# Patient Record
Sex: Male | Born: 1955 | Race: White | Hispanic: No | Marital: Married | Smoking: Never smoker
Health system: Southern US, Community
[De-identification: ages and names within clinical notes are randomized; demographics above are authoritative.]

## PROBLEM LIST (undated history)

## (undated) DIAGNOSIS — D580 Hereditary spherocytosis: Secondary | ICD-10-CM

## (undated) DIAGNOSIS — F329 Major depressive disorder, single episode, unspecified: Secondary | ICD-10-CM

## (undated) DIAGNOSIS — R7309 Other abnormal glucose: Secondary | ICD-10-CM

## (undated) DIAGNOSIS — F988 Other specified behavioral and emotional disorders with onset usually occurring in childhood and adolescence: Secondary | ICD-10-CM

## (undated) DIAGNOSIS — I10 Essential (primary) hypertension: Secondary | ICD-10-CM

## (undated) DIAGNOSIS — E785 Hyperlipidemia, unspecified: Secondary | ICD-10-CM

## (undated) DIAGNOSIS — F32A Depression, unspecified: Secondary | ICD-10-CM

## (undated) DIAGNOSIS — D649 Anemia, unspecified: Secondary | ICD-10-CM

## (undated) DIAGNOSIS — E559 Vitamin D deficiency, unspecified: Secondary | ICD-10-CM

## (undated) HISTORY — DX: Vitamin D deficiency, unspecified: E55.9

## (undated) HISTORY — DX: Essential (primary) hypertension: I10

## (undated) HISTORY — PX: FRACTURE SURGERY: SHX138

## (undated) HISTORY — DX: Anemia, unspecified: D64.9

## (undated) HISTORY — DX: Other abnormal glucose: R73.09

## (undated) HISTORY — DX: Depression, unspecified: F32.A

## (undated) HISTORY — DX: Hyperlipidemia, unspecified: E78.5

## (undated) HISTORY — DX: Other specified behavioral and emotional disorders with onset usually occurring in childhood and adolescence: F98.8

## (undated) HISTORY — DX: Hereditary spherocytosis: D58.0

## (undated) HISTORY — DX: Major depressive disorder, single episode, unspecified: F32.9

## (undated) HISTORY — PX: ORTHOPEDIC SURGERY: SHX850

---

## 1999-10-25 ENCOUNTER — Encounter (INDEPENDENT_AMBULATORY_CARE_PROVIDER_SITE_OTHER): Payer: Self-pay

## 1999-10-25 ENCOUNTER — Observation Stay (HOSPITAL_COMMUNITY): Admission: EM | Admit: 1999-10-25 | Discharge: 1999-10-26 | Payer: Self-pay | Admitting: *Deleted

## 2000-03-06 LAB — HM COLONOSCOPY: HM Colonoscopy: NEGATIVE

## 2000-05-17 ENCOUNTER — Ambulatory Visit (HOSPITAL_COMMUNITY): Admission: RE | Admit: 2000-05-17 | Discharge: 2000-05-17 | Payer: Self-pay | Admitting: Gastroenterology

## 2003-04-13 ENCOUNTER — Ambulatory Visit (HOSPITAL_COMMUNITY): Admission: RE | Admit: 2003-04-13 | Discharge: 2003-04-13 | Payer: Self-pay | Admitting: Internal Medicine

## 2009-08-19 ENCOUNTER — Ambulatory Visit (HOSPITAL_COMMUNITY): Admission: RE | Admit: 2009-08-19 | Discharge: 2009-08-19 | Payer: Self-pay | Admitting: Gastroenterology

## 2010-07-22 NOTE — Procedures (Signed)
[facility]  Patient:    Kevin Gomez, Kevin Gomez                     MRN: [id number] Proc. Date: 10/25/99 Adm. Date:  [id number] Disc. Date: [id number] Attending:  Dennison Bulla Ii CC:         Verlin Grills, M.D.   Procedure Report  PROCEDURE:  Flexible sigmoidoscopy and biopsy.  SURGEON:  James L. Edwards, M.D.  MEDICATIONS:  Fentanyl 87.5 mcg and Versed 7 mg IV.  INDICATIONS:  Bright red blood, mild abdominal discomfort, and diarrhea.  DESCRIPTION OF PROCEDURE:  The procedure had been explained to the patient and consent obtained.  Apparently, the original dictation was lost.  The scope was inserted.  We were able to advance to 70 cm.  The mucosa was normal from 40-70 cm.  It was red, friable, edematous, and somewhat ulcerated consistent with a colitis from 20-40 cm.  Stool was obtained and sent for culture.  The patient had received no prep.  He tolerated the procedure well.  ASSESSMENT:  Segmental colitis, possibly infectious versus ischemic versus Crohns.  PLAN:  Will keep n.p.o., check pathology and cultures, and make further recommendations depending on the results. DD:  01/17/00 TD:  01/17/00 Job: [id number]

## 2010-07-22 NOTE — H&P (Signed)
[facility]  Patient:    Kevin Gomez, Kevin Gomez                     MRN: [id number] Adm. Date:  [id number] Attending:  Ephriam Knuckles H CC:         Verlin Grills, M.D.             Dr. Baldwin [place]                         History and Physical  REASON FOR ADMISSION:  Acute GI bleeding.  HISTORY OF PRESENT ILLNESS:  A 55 year old gentleman who has had two days of severe cramping abdominal pain and diarrhea that became bloody, that started yesterday.  He was able to sleep through the night, woke up with bloody diarrhea this morning.  Was seen at the [facility] health center.  Hemoglobin was 11.3, white blood count was normal.  Three hours later, his hemoglobin was still 11.9 and white count was normal.  Due to his bleeding, we had seen him in the emergency room with this history.  MEDICATIONS:  Atenolol, Vasotec, Prozac, Xanax.  ALLERGIES:  He has no drug allergies.  PAST MEDICAL HISTORY:  History of pancreatitis that was felt to be due to high triglycerides.  He also has a history of elevated blood pressure.  He has had a previous endoscopy and colonoscopy by Dr. Laural Benes back in 1999 that was negative.  FAMILY HISTORY:  Positive for gallstones, hypertension, negative for colon cancer.  SOCIAL HISTORY:  The patient is married.  He does not smoke but drinks in the past but has stopped for his pancreatitis.  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  PHYSICAL EXAMINATION:  VITAL SIGNS:  Temperature 97.1, pulse 51, respirations 20, blood pressure 137/67.  GENERAL:  A pleasant, nonicteric white male in no acute distress.  HEENT:  Eyes clear nonicteric.  Extraocular movements intact.  HEART:  Regular rate and rhythm without murmurs or gallops.  LUNGS:  Clear.  ABDOMEN:  Soft with markedly increased bowel sounds and mild tenderness on the left side.  RECTAL:  There is bright red blood on the examining finger by  the ER doctor.  ASSESSMENT:  Acute bloody diarrhea.  This is probably infectious.  Other potential causes would be ischemic or ulcerative colitis, but lack of chronic symptoms makes that less likely.  PLAN:  Will go ahead and do a sigmoidoscopy now and will admit the patient for observation. DD:  10/25/99 TD:  10/26/99 Job: [id number]

## 2010-07-22 NOTE — Procedures (Signed)
[facility]  Patient:    Kevin Gomez, Kevin Gomez                    MRN: [id number] Proc. Date: 05/17/00 Attending:  Verlin Grills, M.D. CC:         Marinus Maw, M.D.   Procedure Report  DATE OF BIRTH:  [birth year]  REFERRING PHYSICIAN:  Marinus Maw, M.D.  PROCEDURE PERFORMED:  Colonoscopy.  ENDOSCOPIST:  Verlin Grills, M.D.  INDICATIONS FOR PROCEDURE:  The patient is a 55 year old male who developed hemoccult positive stool while taking diclofenac for a painful shoulder.  In March 1999, Kevin Gomez underwent esophagogastroduodenoscopy and proctocolonoscopy to the cecum to evaluate iron deficiency anemia.  A hyperplastic but non-neoplastic polyp was removed from his colon.  Otherwise, his esophagogastroduodenoscopy and proctocolonoscopy to the cecum was normal.  I discussed with the patient the complications associated with colonoscopy and polypectomy including intestinal bleeding and intestinal perforation.  The patient has signed the operative permit.  PREMEDICATION:  Fentanyl 100 mcg, Versed 20 mg.  ENDOSCOPE:  Olympus pediatric colonoscope.  DESCRIPTION OF PROCEDURE:  After obtaining informed consent, the patient was placed in the left lateral decubitus position.  I administered intravenous fentanyl and intravenous Versed to achieve sedation for the procedure.  The patients blood pressure, oxygen saturation and cardiac rhythm were monitored throughout the procedure and documented in the medical record.  Anal inspection was normal.  Digital rectal exam was normal.  The Olympus pediatric video colonoscope was then introduced into the rectum and under direct vision, advanced to the cecum as identified by a normal-appearing ileocecal valve.  Colonic preparation for the exam today was excellent.  Rectum:  Normal.  Sigmoid colon:  Normal.  Descending colon:  Normal.  Splenic flexure:   Normal.  Transverse colon:  Normal.  Hepatic flexure:  Normal.  Ascending colon:  Normal.  Cecum and ileocecal valve:  Normal.  ASSESSMENT:  Normal proctocolonoscopy to the cecum.  No endoscopic evidence for the presence of colorectal neoplasia or inflammatory bowel disease. DD:  05/17/00 TD:  05/17/00 Job: [id number]

## 2011-05-31 ENCOUNTER — Ambulatory Visit (HOSPITAL_COMMUNITY)
Admission: RE | Admit: 2011-05-31 | Discharge: 2011-05-31 | Disposition: A | Discharge status: Home / Self Care | Payer: BC Managed Care – PPO | Source: Ambulatory Visit | Attending: Internal Medicine | Admitting: Internal Medicine

## 2011-05-31 ENCOUNTER — Other Ambulatory Visit (HOSPITAL_COMMUNITY): Payer: Self-pay | Admitting: Internal Medicine

## 2011-05-31 DIAGNOSIS — S60219A Contusion of unspecified wrist, initial encounter: Secondary | ICD-10-CM

## 2013-01-22 ENCOUNTER — Encounter: Payer: Self-pay | Admitting: Internal Medicine

## 2013-01-22 DIAGNOSIS — R7309 Other abnormal glucose: Secondary | ICD-10-CM | POA: Insufficient documentation

## 2013-01-22 DIAGNOSIS — D580 Hereditary spherocytosis: Secondary | ICD-10-CM | POA: Insufficient documentation

## 2013-01-22 DIAGNOSIS — F988 Other specified behavioral and emotional disorders with onset usually occurring in childhood and adolescence: Secondary | ICD-10-CM | POA: Insufficient documentation

## 2013-01-22 DIAGNOSIS — I10 Essential (primary) hypertension: Secondary | ICD-10-CM | POA: Insufficient documentation

## 2013-01-23 ENCOUNTER — Encounter: Payer: Self-pay | Admitting: Physician Assistant

## 2013-01-23 ENCOUNTER — Ambulatory Visit: Payer: Self-pay | Admitting: Internal Medicine

## 2013-01-23 ENCOUNTER — Ambulatory Visit: Payer: BC Managed Care – PPO | Admitting: Physician Assistant

## 2013-01-23 ENCOUNTER — Other Ambulatory Visit: Payer: Self-pay | Admitting: Physician Assistant

## 2013-01-23 VITALS — BP 128/78 | HR 56 | Temp 98.9°F | Resp 16 | Ht 70.0 in | Wt 185.0 lb

## 2013-01-23 DIAGNOSIS — E782 Mixed hyperlipidemia: Secondary | ICD-10-CM | POA: Insufficient documentation

## 2013-01-23 DIAGNOSIS — E291 Testicular hypofunction: Secondary | ICD-10-CM

## 2013-01-23 DIAGNOSIS — R7309 Other abnormal glucose: Secondary | ICD-10-CM

## 2013-01-23 DIAGNOSIS — I10 Essential (primary) hypertension: Secondary | ICD-10-CM

## 2013-01-23 DIAGNOSIS — E559 Vitamin D deficiency, unspecified: Secondary | ICD-10-CM

## 2013-01-23 DIAGNOSIS — E785 Hyperlipidemia, unspecified: Secondary | ICD-10-CM

## 2013-01-23 HISTORY — DX: Mixed hyperlipidemia: E78.2

## 2013-01-23 LAB — BASIC METABOLIC PANEL WITH GFR
BUN: 11 mg/dL (ref 6–23)
Chloride: 101 mEq/L (ref 96–112)
GFR, Est African American: >89 mL/min
Glucose, Bld: 86 mg/dL (ref 70–99)
Potassium: 4.6 mEq/L (ref 3.5–5.3)

## 2013-01-23 LAB — CBC WITH DIFFERENTIAL/PLATELET
Basophils Relative: 0 % (ref 0–1)
HCT: 43.2 % (ref 39.0–52.0)
Hemoglobin: 14.5 g/dL (ref 13.0–17.0)
MCH: 20.8 pg — ABNORMAL LOW (ref 26.0–34.0)
MCHC: 33.6 g/dL (ref 30.0–36.0)
Monocytes Absolute: 0.3 10*3/uL (ref 0.1–1.0)
Monocytes Relative: 5 % (ref 3–12)
Neutro Abs: 4.5 10*3/uL (ref 1.7–7.7)

## 2013-01-23 LAB — HEPATIC FUNCTION PANEL
ALT: 53 U/L (ref 0–53)
AST: 52 U/L — ABNORMAL HIGH (ref 0–37)
Alkaline Phosphatase: 61 U/L (ref 39–117)
Indirect Bilirubin: 1.1 mg/dL — ABNORMAL HIGH (ref 0.0–0.9)
Total Protein: 6.1 g/dL (ref 6.0–8.3)

## 2013-01-23 LAB — LIPID PANEL: LDL Cholesterol: 39 mg/dL (ref 0–99)

## 2013-01-23 MED ORDER — TESTOSTERONE CYPIONATE 200 MG/ML IM SOLN: INTRAMUSCULAR | Status: DC

## 2013-01-23 NOTE — Progress Notes (Signed)
HPI Patient presents for 3 month follow up with hypertension, hyperlipidemia, prediabetes and vitamin D. Patient's blood pressure has been controlled at home. Patient denies chest pain, shortness of breath, dizziness.  Patient's cholesterol is diet controlled. The cholesterol last visit was LDL 49, HDL 24, trigs 279. The patient has been working on diet and exercise for prediabetes, and denies changes in vision, polys, and paresthesias.  Last A1C was 5.1.  Patient has hypogonadism and feels that the shots are helping, last shot was one week ago.  Patient is on Vitamin D supplement.  Current Medications:  Current Outpatient Prescriptions on File Prior to Visit  Medication Sig Dispense Refill  . allopurinol (ZYLOPRIM) 300 MG tablet Take 300 mg by mouth daily. Takes 1/2 tag daily      . atenolol (TENORMIN) 100 MG tablet Take 100 mg by mouth daily.      . Cholecalciferol (VITAMIN D) 2000 UNITS tablet Take 2,000 Units by mouth 2 (two) times daily.      Marland Kitchen desvenlafaxine (PRISTIQ) 100 MG 24 hr tablet Take 100 mg by mouth daily.      . enalapril (VASOTEC) 20 MG tablet Take 20 mg by mouth daily.      Marland Kitchen gabapentin (NEURONTIN) 300 MG capsule Take 300 mg by mouth 3 (three) times daily.      . meloxicam (MOBIC) 15 MG tablet Take 15 mg by mouth daily.      . methylphenidate (RITALIN SR) 20 MG ER tablet Take 20 mg by mouth daily. Takes 3 per day      . mirtazapine (REMERON) 45 MG tablet Take 45 mg by mouth at bedtime.      . Omega-3 Fatty Acids (FISH OIL) 1000 MG CAPS Take 1,000 capsules by mouth.      . testosterone cypionate (DEPO-TESTOSTERONE) 200 MG/ML injection Inject into the muscle. Injects 1 ml every week       No current facility-administered medications on file prior to visit.   Medical History:  Past Medical History  Diagnosis Date  . Hypertension   . Depression   . Hyperlipidemia   . Elevated hemoglobin A1c   . Anemia   . ADD (attention deficit disorder)   . Elevated hemoglobin A1c   .  Vitamin D deficiency    Allergies:  Allergies  Allergen Reactions  . Serzone [Nefazodone]     Leg cramps  . Trazamine [Trazodone & Diet Manage Prod] Other (See Comments)    confusion    ROS Constitutional: Denies fever, chills, weight loss/gain, headaches, insomnia, fatigue, night sweats, and change in appetite. Eyes: Denies redness, blurred vision, diplopia, discharge, itchy, watery eyes.  ENT: Denies discharge, congestion, post nasal drip, sore throat, earache, dental pain, Tinnitus, Vertigo, Sinus pain, snoring.  Cardio: Denies chest pain, palpitations, irregular heartbeat,  dyspnea, diaphoresis, orthopnea, PND, claudication, edema Respiratory: denies cough, dyspnea,pleurisy, hoarseness, wheezing.  Gastrointestinal: Denies dysphagia, heartburn,  water brash, pain, cramps, nausea, vomiting, bloating, diarrhea, constipation, hematemesis, melena, hematochezia,  hemorrhoids Genitourinary: Denies dysuria, frequency, urgency, nocturia, hesitancy, discharge, hematuria, flank pain Musculoskeletal: Denies arthralgia, myalgia, stiffness, Jt. Swelling, pain, limp, and strain/sprain. Skin: Denies pruritis, rash, hives, warts, acne, eczema, changing in skin lesion Neuro: Weakness, tremor, incoordination, spasms, paresthesia, pain Psychiatric: Denies confusion, memory loss, sensory loss Endocrine: Denies change in weight, skin, hair change, nocturia, and paresthesia, Diabetic Polys, visual blurring, hyper /hypo glycemic episodes.  Heme/Lymph: Excessive bleeding, bruising, enlarged lymph nodes  Family history- Review and unchanged Social history- Review and unchanged Physical Exam: [facility]  Vitals:   01/23/13 1150  BP: 128/78  Pulse: 56  Temp: 98.9 F (37.2 C)  Resp: 16   Filed Weights   01/23/13 1150  Weight: 185 lb (83.915 kg)   General Appearance: Well nourished, in no apparent distress. Eyes: PERRLA, EOMs, conjunctiva no swelling or erythema, normal fundi and vessels. Sinuses: No  Frontal/maxillary tenderness ENT/Mouth: Ext aud canals clear, with TMs without erythema, bulging.No erythema, swelling, or exudate on post pharynx.  Tonsils not swollen or erythematous. Hearing normal.  Neck: Supple, thyroid normal.  Respiratory: Respiratory effort normal, BS equal bilaterally without rales, rhonchi, wheezing or stridor.  Cardio: Heart sounds normal, regular rate and rhythm without murmurs, rubs or gallops. Peripheral pulses brisk and equal bilaterally, without edema.  Abdomen: Flat, soft, with bowel sounds. Non tender, no guarding, rebound, hernias, masses, or organomegaly.  Lymphatics: Non tender without lymphadenopathy.  Musculoskeletal: Full ROM all peripheral extremities, joint stability, 5/5 strength, and normal gait. Skin: Warm, dry without rashes, lesions, ecchymosis.  Neuro: Cranial nerves intact, reflexes equal bilaterally. Normal muscle tone, no cerebellar symptoms. Sensation intact.  Psych: Awake and oriented X 3, normal affect, Insight and Judgment appropriate.   Assessment and Plan:  Hypertension: Continue medication, monitor blood pressure at home. Continue DASH diet. Cholesterol: Continue diet and exercise. Check cholesterol.  Pre-diabetes-Continue diet and exercise. Since it was normal last time we will not check it this time.  Depression- patient states seeing new psych and seeing Ocie Bob with [facility], continue to follow Hypogonadism- prescription sent, check level and continue shots. Vitamin D Def- check level and continue medications.   Kevin Gomez 12:02 PM

## 2013-01-23 NOTE — Patient Instructions (Signed)
Triglycerides, TG, TRIG This is a test to check your risk of developing heart disease. It is often done as part of a lipid profile during a regular medical exam or if you are being treated for high triglycerides. This test measures the amount of triglycerides in your blood. Triglycerides are the body's storage form for fat. Most triglycerides are found in fat tissue. Some triglycerides circulate in the blood to provide fuel for muscles to work. Extra triglycerides are found in the blood after eating a meal when fat is being sent from the gut to fat tissue for storage. The test for triglycerides should be done when you are fasting and no extra triglycerides from a recent meal are present.  SAMPLE COLLECTION The test for triglycerides uses a blood sample. Most often, the blood sample is collected using a needle to collect blood from a vein. Sometimes triglycerides are measured using a drop of blood collected by puncturing the skin on a finger. Testing should be done when you are fasting. For 12 to 14 hours before the test, only water is permitted. In addition, alcohol should not be consumed for the 24 hours just before the test. If you are diabetic and your blood sugar is out of control, triglycerides will be very high. NORMAL FINDINGS  Adult/elderly  Male: 40-160 mg/dL or 1.61-0.96 mmol/L (SI units)  Male: 35-135 mg/dL or 0.45-4.09 mmol/L (SI units)  0-56 years  Male: 30-86 mg/dL  Male: 81-19 mg/dL  1-56 years  Male: 82-956 mg/dL  Male: 21-308 mg/dL  65-56 years  Male: 46-962 mg/dL  Male: 95-284 mg/dL  13-56 years  Male: 40-102 mg/dL  Male: 72-536 mg/dL Ranges for normal findings may vary among different laboratories and hospitals. You should always check with your doctor after having lab work or other tests done to discuss the meaning of your test results and whether your values are considered within normal limits. MEANING OF TEST  Your caregiver will go over the test  results with you and discuss the importance and meaning of your results, as well as treatment options and the need for additional tests if necessary. OBTAINING THE TEST RESULTS It is your responsibility to obtain your test results. Ask the lab or department performing the test when and how you will get your results. Document Released: 03/25/2004 Document Revised: 05/15/2011 Document Reviewed: 02/02/2008 [facility] Patient Information 2014 [place], [place]. HDL Cholesterol The test for high-density lipoprotein (HDL) cholesterol is used along with other lipid tests to determine your risk of developing heart disease. HDL is one of the classes of lipoproteins that carry cholesterol in the blood. H-density lipoprotein cholesterol is considered to be beneficial because it removes excess cholesterol and disposes of it. Hence HDL cholesterol is often termed "good" cholesterol. The test for HDL measures the amount of HDL cholesterol in blood. High levels of HDL cholesterol are good. PREPARATION FOR TEST A blood sample is drawn from a vein in the arm or from a finger. Fasting is not required unless the HDL cholesterlol level test is ordered with other tests, including total cholesterol, low-density lipoprotein cholesterol, and triglyceride level tests as part of a lipid profile. A complete lipid profile requires fasting for at least 12 hours before the test. If the testing occurs when a person is not fasting, only the HDL and total cholesterol levels may be used for risk assessment. NORMAL FINDINGS Male: greater than 45 mg/dL or greater than 6.44 mmol/L  Male: greater than 55 mg/dL or greater than 0.34 mmol/L  Ranges for normal findings may vary among different laboratories and hospitals. You should always check with your doctor after having lab work or other tests done to discuss the meaning of your test results and whether your values are considered within normal limits. MEANING OF TEST  Your caregiver will go  over the test results with you and discuss the importance and meaning of your results, as well as treatment options and the need for additional tests. OBTAINING THE TEST RESULTS It is your responsibility to obtain your test results. Ask the lab or department performing the test when and how you will get your results. Document Released: 03/16/2004 Document Revised: 10/23/2012 Document Reviewed: 02/01/2008 [facility] Patient Information 2014 [place], [place].

## 2013-01-24 LAB — TSH: TSH: 1.527 u[IU]/mL (ref 0.350–4.500)

## 2013-01-24 LAB — IRON AND TIBC: Iron: 151 ug/dL (ref 42–165)

## 2013-01-24 LAB — VITAMIN D 25 HYDROXY (VIT D DEFICIENCY, FRACTURES): Vit D, 25-Hydroxy: 76 ng/mL (ref 30–89)

## 2013-01-24 LAB — TESTOSTERONE: Testosterone: 138 ng/dL — ABNORMAL LOW (ref 300–890)

## 2013-01-28 ENCOUNTER — Other Ambulatory Visit: Payer: Self-pay | Admitting: Internal Medicine

## 2013-02-05 ENCOUNTER — Other Ambulatory Visit: Payer: Self-pay | Admitting: Internal Medicine

## 2013-03-06 ENCOUNTER — Other Ambulatory Visit: Payer: Self-pay | Admitting: Internal Medicine

## 2013-03-11 NOTE — Telephone Encounter (Signed)
Called pt scheduled pt for F/U on 04/30/13 w/ Marchelle FolksAmanda SB

## 2013-04-28 ENCOUNTER — Other Ambulatory Visit: Payer: Self-pay | Admitting: Internal Medicine

## 2013-04-30 ENCOUNTER — Ambulatory Visit: Payer: Self-pay | Admitting: Physician Assistant

## 2013-05-09 ENCOUNTER — Encounter: Payer: Self-pay | Admitting: Physician Assistant

## 2013-05-09 ENCOUNTER — Ambulatory Visit (INDEPENDENT_AMBULATORY_CARE_PROVIDER_SITE_OTHER): Payer: BC Managed Care – PPO | Admitting: Physician Assistant

## 2013-05-09 VITALS — BP 132/82 | HR 56 | Temp 98.6°F | Resp 16 | Ht 70.0 in | Wt 187.0 lb

## 2013-05-09 DIAGNOSIS — R7309 Other abnormal glucose: Secondary | ICD-10-CM

## 2013-05-09 DIAGNOSIS — E291 Testicular hypofunction: Secondary | ICD-10-CM

## 2013-05-09 DIAGNOSIS — E785 Hyperlipidemia, unspecified: Secondary | ICD-10-CM

## 2013-05-09 DIAGNOSIS — I10 Essential (primary) hypertension: Secondary | ICD-10-CM

## 2013-05-09 DIAGNOSIS — Z79899 Other long term (current) drug therapy: Secondary | ICD-10-CM

## 2013-05-09 DIAGNOSIS — E559 Vitamin D deficiency, unspecified: Secondary | ICD-10-CM

## 2013-05-09 HISTORY — DX: Testicular hypofunction: E29.1

## 2013-05-09 LAB — CBC WITH DIFFERENTIAL/PLATELET
BASOS ABS: 0 10*3/uL (ref 0.0–0.1)
Basophils Relative: 0 % (ref 0–1)
EOS PCT: 1 % (ref 0–5)
Eosinophils Absolute: 0.1 10*3/uL (ref 0.0–0.7)
HCT: 47.2 % (ref 39.0–52.0)
Hemoglobin: 15.6 g/dL (ref 13.0–17.0)
LYMPHS PCT: 24 % (ref 12–46)
Lymphs Abs: 2.1 10*3/uL (ref 0.7–4.0)
MCH: 20.5 pg — ABNORMAL LOW (ref 26.0–34.0)
MCHC: 33.1 g/dL (ref 30.0–36.0)
MCV: 62 fL — AB (ref 78.0–100.0)
Monocytes Absolute: 0.4 10*3/uL (ref 0.1–1.0)
Monocytes Relative: 5 % (ref 3–12)
NEUTROS ABS: 6.2 10*3/uL (ref 1.7–7.7)
NEUTROS PCT: 70 % (ref 43–77)
PLATELETS: 163 10*3/uL (ref 150–400)
RBC: 7.61 MIL/uL — AB (ref 4.22–5.81)
RDW: 18.2 % — AB (ref 11.5–15.5)
WBC: 8.8 10*3/uL (ref 4.0–10.5)

## 2013-05-09 NOTE — Patient Instructions (Signed)
DASH Diet The DASH diet stands for "Dietary Approaches to Stop Hypertension." It is a healthy eating plan that has been shown to reduce high blood pressure (hypertension) in as little as 14 days, while also possibly providing other significant health benefits. These other health benefits include reducing the risk of breast cancer after menopause and reducing the risk of type 2 diabetes, heart disease, colon cancer, and stroke. Health benefits also include weight loss and slowing kidney failure in patients with chronic kidney disease.  DIET GUIDELINES Limit salt (sodium). Your diet should contain less than 1500 mg of sodium daily. Limit refined or processed carbohydrates. Your diet should include mostly whole grains. Desserts and added sugars should be used sparingly. Include small amounts of heart-healthy fats. These types of fats include nuts, oils, and tub margarine. Limit saturated and trans fats. These fats have been shown to be harmful in the body. CHOOSING FOODS  The following food groups are based on a 2000 calorie diet. See your Registered Dietitian for individual calorie needs. Grains and Grain Products (6 to 8 servings daily) Eat More Often: Whole-wheat bread, brown rice, whole-grain or wheat pasta, quinoa, popcorn without added fat or salt (air popped). Eat Less Often: White bread, white pasta, white rice, cornbread. Vegetables (4 to 5 servings daily) Eat More Often: Fresh, frozen, and canned vegetables. Vegetables may be raw, steamed, roasted, or grilled with a minimal amount of fat. Eat Less Often/Avoid: Creamed or fried vegetables. Vegetables in a cheese sauce. Fruit (4 to 5 servings daily) Eat More Often: All fresh, canned (in natural juice), or frozen fruits. Dried fruits without added sugar. One hundred percent fruit juice ( cup [237 mL] daily). Eat Less Often: Dried fruits with added sugar. Canned fruit in light or heavy syrup. Foot LockerLean Meats, Fish, and Poultry (2 servings or less  daily. One serving is 3 to 4 oz [85-114 g]). Eat More Often: Ninety percent or leaner ground beef, tenderloin, sirloin. Round cuts of beef, chicken breast, Malaw[place]urkey breast. All fish. Grill, bake, or broil your meat. Nothing should be fried. Eat Less Often/Avoid: Fatty cuts of meat, Mal[place]key, or chicken leg, thigh, or wing. Fried cuts of meat or fish. Dairy (2 to 3 servings) Eat More Often: Low-fat or fat-free milk, low-fat plain or light yogurt, reduced-fat or part-skim cheese. Eat Less Often/Avoid: Milk (whole, 2%).Whole milk yogurt. Full-fat cheeses. Nuts, Seeds, and Legumes (4 to 5 servings per week) Eat More Often: All without added salt. Eat Less Often/Avoid: Salted nuts and seeds, canned beans with added salt. Fats and Sweets (limited) Eat More Often: Vegetable oils, tub margarines without trans fats, sugar-free gelatin. Mayonnaise and salad dressings. Eat Less Often/Avoid: Coconut oils, palm oils, butter, stick margarine, cream, half and half, cookies, candy, pie. FOR MORE INFORMATION The Dash Diet Eating Plan: www.dashdiet.org Document Released: 02/09/2011 Document Revised: 05/15/2011 Document Reviewed: 02/09/2011 [facility] Patient Information 2014 [place], [place]. Triglycerides, TG, TRIG This is a test to check your risk of developing heart disease. It is often done as part of a lipid profile during a regular medical exam or if you are being treated for high triglycerides. This test measures the amount of triglycerides in your blood. Triglycerides are the body's storage form for fat. Most triglycerides are found in fat tissue. Some triglycerides circulate in the blood to provide fuel for muscles to work. Extra triglycerides are found in the blood after eating a meal when fat is being sent from the gut to fat tissue for storage. The test for triglycerides  should be done when you are fasting and no extra triglycerides from a recent meal are present.  SAMPLE COLLECTION The test for  triglycerides uses a blood sample. Most often, the blood sample is collected using a needle to collect blood from a vein. Sometimes triglycerides are measured using a drop of blood collected by puncturing the skin on a finger. Testing should be done when you are fasting. For 12 to 14 hours before the test, only water is permitted. In addition, alcohol should not be consumed for the 24 hours just before the test. If you are diabetic and your blood sugar is out of control, triglycerides will be very high. NORMAL FINDINGS  Adult/elderly  Male: 40-160 mg/dL or 1.61-0.96 mmol/L (SI units)  Male: 35-135 mg/dL or 0.45-4.09 mmol/L (SI units)  0-57 years  Male: 30-86 mg/dL  Male: 81-19 mg/dL  1-57 years  Male: 82-956 mg/dL  Male: 21-308 mg/dL  65-57 years  Male: 46-962 mg/dL  Male: 95-284 mg/dL  13-57 years  Male: 40-102 mg/dL  Male: 72-536 mg/dL Ranges for normal findings may vary among different laboratories and hospitals. You should always check with your doctor after having lab work or other tests done to discuss the meaning of your test results and whether your values are considered within normal limits. MEANING OF TEST  Your caregiver will go over the test results with you and discuss the importance and meaning of your results, as well as treatment options and the need for additional tests if necessary. OBTAINING THE TEST RESULTS It is your responsibility to obtain your test results. Ask the lab or department performing the test when and how you will get your results. Document Released: 03/25/2004 Document Revised: 05/15/2011 Document Reviewed: 02/02/2008 [facility] Patient Information 2014 [place], [place].

## 2013-05-09 NOTE — Progress Notes (Signed)
HPI 58 y.o. male  presents for 3 month follow up with hypertension, hyperlipidemia, hypogonadism and vitamin D. His blood pressure has been controlled at home, today their BP is BP: 132/82 mmHg He does not workout due to lower back pain. He denies chest pain, shortness of breath, dizziness.  He is not on cholesterol medication and denies myalgias. His cholesterol is at goal. The cholesterol last visit was:   Lab Results  Component Value Date   CHOL 124 01/23/2013   HDL 27* 01/23/2013   LDLCALC 39 01/23/2013   TRIG 291* 01/23/2013   CHOLHDL 4.6 01/23/2013   Last W0JA1C in the office was: 5.1 Patient is on Vitamin D supplement.   Patient is on testosterone, last shot was Tuesday. He does 1cc q weekly. Lab Results  Component Value Date   TESTOSTERONE 138* 01/23/2013   Lower back pain achy with weather, denies radiation/numbness tingling. Depression- follows with Dr. Lance CoonPoulos, has switched to abilify and pristiq, states they think it is helping, may cause diarrhea. Follows up in a couple of weeks.   Current Medications:  Current Outpatient Prescriptions on File Prior to Visit  Medication Sig Dispense Refill  . allopurinol (ZYLOPRIM) 300 MG tablet Take 300 mg by mouth daily. Takes 1/2 tag daily      . atenolol (TENORMIN) 100 MG tablet Take 100 mg by mouth daily.      . B-D 3CC LUER-LOK SYR 23GX1" 23G X 1" 3 ML MISC USE WITH TESTOSTERONE  100 each  6  . Cholecalciferol (VITAMIN D) 2000 UNITS tablet Take 2,000 Units by mouth 2 (two) times daily.      Marland Kitchen. desvenlafaxine (PRISTIQ) 100 MG 24 hr tablet Take 100 mg by mouth daily.      . enalapril (VASOTEC) 20 MG tablet Take 20 mg by mouth daily.      Marland Kitchen. gabapentin (NEURONTIN) 800 MG tablet TAKE 6 TABLETS DAILY OR AS DIRECTED FOR PAIN  180 tablet  1  . meloxicam (MOBIC) 15 MG tablet TAKE 1 TABLET EVERY DAY AS NEEDED FOR PAIN/INFLAMMATION  90 tablet  0  . methylphenidate (RITALIN SR) 20 MG ER tablet Take 20 mg by mouth daily. Takes 3 per day      .  mirtazapine (REMERON) 45 MG tablet Take 45 mg by mouth at bedtime.      . Omega-3 Fatty Acids (FISH OIL) 1000 MG CAPS Take 1,000 capsules by mouth.      . testosterone cypionate (DEPO-TESTOSTERONE) 200 MG/ML injection Injects 1 ml every week  10 mL  3   No current facility-administered medications on file prior to visit.   Medical History:  Past Medical History  Diagnosis Date  . Hypertension   . Depression   . Hyperlipidemia   . Elevated hemoglobin A1c   . Anemia   . ADD (attention deficit disorder)   . Elevated hemoglobin A1c   . Vitamin D deficiency    Allergies:  Allergies  Allergen Reactions  . Serzone [Nefazodone]     Leg cramps  . Trazamine [Trazodone & Diet Manage Prod] Other (See Comments)    confusion     Review of Systems: [X]  = complains of  [ ]  = denies  General: Fatigue [ ]  Fever [ ]  Chills [ ]  Weakness [ ]   Insomnia [ ]  Eyes: Redness [ ]  Blurred vision [ ]  Diplopia [ ]   ENT: Congestion [ ]  Sinus Pain [ ]  Post Nasal Drip [ ]  Sore Throat [ ]  Earache [ ]   Cardiac: Chest pain/pressure [ ]  SOB [ ]  Orthopnea [ ]   Palpitations [ ]   Paroxysmal nocturnal dyspnea[ ]  Claudication [ ]  Edema [ ]   Pulmonary: Cough [ ]  Wheezing[ ]   SOB [ ]   Snoring [ ]   GI: Nausea [ ]  Vomiting[ ]  Dysphagia[ ]  Heartburn[ ]  Abdominal pain [ ]  Constipation [ ] ; Diarrhea [ ] ; BRBPR [ ]  Melena[ ]  GU: Hematuria[ ]  Dysuria [ ]  Nocturia[ ]  Urgency [ ]   Hesitancy [ ]  Discharge [ ]  Neuro: Headaches[ ]  Vertigo[ ]  Paresthesias[ ]  Spasm [ ]  Speech changes [ ]  Incoordination [ ]   Ortho: Arthritis [ ]  Joint pain [ ]  Muscle pain [ ]  Joint swelling [ ]  Back Pain Arly.Keller ] Skin:  Rash [ ]   Pruritis [ ]  Change in skin lesion [ ]   Psych: Depression[X ] Anxiety[X ] Confusion [ ]  Memory loss [ ]   Heme/Lypmh: Bleeding [ ]  Bruising [ ]  Enlarged lymph nodes [ ]   Endocrine: Visual blurring [ ]  Paresthesia [ ]  Polyuria [ ]  Polydypsea [ ]    Heat/cold intolerance [ ]  Hypoglycemia [ ]   Family history- Review and  unchanged Social history- Review and unchanged Physical Exam: Filed Vitals:   05/09/13 1015  BP: 132/82  Pulse: 56  Temp: 98.6 F (37 C)  Resp: 16   Wt Readings from Last 3 Encounters:  05/09/13 187 lb (84.823 kg)  01/23/13 185 lb (83.915 kg)   General Appearance: Well nourished, in no apparent distress. Eyes: PERRLA, EOMs, conjunctiva no swelling or erythema Sinuses: No Frontal/maxillary tenderness ENT/Mouth: Ext aud canals clear, TMs without erythema, bulging. No erythema, swelling, or exudate on post pharynx.  Tonsils not swollen or erythematous. Hearing normal.  Neck: Supple, thyroid normal.  Respiratory: Respiratory effort normal, BS equal bilaterally without rales, rhonchi, wheezing or stridor.  Cardio: RRR with no MRGs. Brisk peripheral pulses without edema.  Abdomen: Soft, + BS.  Non tender, no guarding, rebound, hernias, masses. Lymphatics: Non tender without lymphadenopathy.  Musculoskeletal: Full ROM, 5/5 strength, normal gait.  Skin: Warm, dry without rashes, lesions, ecchymosis.  Neuro: Cranial nerves intact. Normal muscle tone, no cerebellar symptoms. Sensation intact.  Psych: Awake and oriented X 3, normal affect, Insight and Judgment appropriate.   Assessment and Plan:  Hypertension: Continue medication, monitor blood pressure at home. Continue DASH diet. Cholesterol: Continue diet and exercise. Check cholesterol.  Hypogonadism- check level, may need to increase to 1.5 cc q week.  Vitamin D Def- check level and continue medications.   Continue diet and meds as discussed. Further disposition pending results of labs.  Kevin Gomez 10:25 AM

## 2013-05-10 LAB — BASIC METABOLIC PANEL WITH GFR
BUN: 13 mg/dL (ref 6–23)
CHLORIDE: 99 meq/L (ref 96–112)
CO2: 32 mEq/L (ref 19–32)
CREATININE: 0.88 mg/dL (ref 0.50–1.35)
Calcium: 9.4 mg/dL (ref 8.4–10.5)
GFR, Est African American: >89 mL/min
Glucose, Bld: 85 mg/dL (ref 70–99)
Potassium: 5.1 mEq/L (ref 3.5–5.3)
SODIUM: 138 meq/L (ref 135–145)

## 2013-05-10 LAB — HEPATIC FUNCTION PANEL
ALBUMIN: 4.5 g/dL (ref 3.5–5.2)
ALK PHOS: 59 U/L (ref 39–117)
ALT: 50 U/L (ref 0–53)
AST: 65 U/L — ABNORMAL HIGH (ref 0–37)
BILIRUBIN DIRECT: 0.3 mg/dL (ref 0.0–0.3)
BILIRUBIN TOTAL: 1.9 mg/dL — AB (ref 0.2–1.2)
Indirect Bilirubin: 1.6 mg/dL — ABNORMAL HIGH (ref 0.2–1.2)
Total Protein: 6.5 g/dL (ref 6.0–8.3)

## 2013-05-10 LAB — LIPID PANEL
CHOL/HDL RATIO: 5.2 ratio
CHOLESTEROL: 136 mg/dL (ref 0–200)
HDL: 26 mg/dL — AB (ref >39)
LDL Cholesterol: 49 mg/dL (ref 0–99)
Triglycerides: 304 mg/dL — ABNORMAL HIGH (ref <150)
VLDL: 61 mg/dL — ABNORMAL HIGH (ref 0–40)

## 2013-05-10 LAB — TSH: TSH: 2.575 u[IU]/mL (ref 0.350–4.500)

## 2013-05-10 LAB — TESTOSTERONE: Testosterone: 1210.92 ng/dL — ABNORMAL HIGH (ref 300–890)

## 2013-05-10 LAB — VITAMIN D 25 HYDROXY (VIT D DEFICIENCY, FRACTURES): Vit D, 25-Hydroxy: 61 ng/mL (ref 30–89)

## 2013-05-10 LAB — MAGNESIUM: Magnesium: 1.9 mg/dL (ref 1.5–2.5)

## 2013-05-12 ENCOUNTER — Other Ambulatory Visit: Payer: Self-pay | Admitting: Internal Medicine

## 2013-06-05 ENCOUNTER — Other Ambulatory Visit: Payer: Self-pay | Admitting: Emergency Medicine

## 2013-06-27 ENCOUNTER — Other Ambulatory Visit: Payer: Self-pay | Admitting: Physician Assistant

## 2013-07-23 ENCOUNTER — Encounter: Payer: Self-pay | Admitting: Internal Medicine

## 2013-07-23 ENCOUNTER — Ambulatory Visit (INDEPENDENT_AMBULATORY_CARE_PROVIDER_SITE_OTHER): Payer: BC Managed Care – PPO | Admitting: Internal Medicine

## 2013-07-23 VITALS — BP 134/82 | HR 56 | Temp 98.2°F | Resp 16 | Ht 70.5 in | Wt 181.0 lb

## 2013-07-23 DIAGNOSIS — R740 Nonspecific elevation of levels of transaminase and lactic acid dehydrogenase [LDH]: Secondary | ICD-10-CM

## 2013-07-23 DIAGNOSIS — I10 Essential (primary) hypertension: Secondary | ICD-10-CM

## 2013-07-23 DIAGNOSIS — Z125 Encounter for screening for malignant neoplasm of prostate: Secondary | ICD-10-CM

## 2013-07-23 DIAGNOSIS — E559 Vitamin D deficiency, unspecified: Secondary | ICD-10-CM

## 2013-07-23 DIAGNOSIS — Z0000 Encounter for general adult medical examination without abnormal findings: Secondary | ICD-10-CM

## 2013-07-23 DIAGNOSIS — Z111 Encounter for screening for respiratory tuberculosis: Secondary | ICD-10-CM

## 2013-07-23 DIAGNOSIS — R7401 Elevation of levels of liver transaminase levels: Secondary | ICD-10-CM

## 2013-07-23 DIAGNOSIS — Z113 Encounter for screening for infections with a predominantly sexual mode of transmission: Secondary | ICD-10-CM

## 2013-07-23 DIAGNOSIS — Z1212 Encounter for screening for malignant neoplasm of rectum: Secondary | ICD-10-CM

## 2013-07-23 DIAGNOSIS — R7402 Elevation of levels of lactic acid dehydrogenase (LDH): Secondary | ICD-10-CM

## 2013-07-23 LAB — CBC WITH DIFFERENTIAL/PLATELET
Basophils Absolute: 0 10*3/uL (ref 0.0–0.1)
Basophils Relative: 0 % (ref 0–1)
EOS ABS: 0.1 10*3/uL (ref 0.0–0.7)
Eosinophils Relative: 1 % (ref 0–5)
HCT: 47.1 % (ref 39.0–52.0)
HEMOGLOBIN: 15.8 g/dL (ref 13.0–17.0)
LYMPHS ABS: 1.4 10*3/uL (ref 0.7–4.0)
Lymphocytes Relative: 20 % (ref 12–46)
MCH: 20.5 pg — AB (ref 26.0–34.0)
MCHC: 33.5 g/dL (ref 30.0–36.0)
MCV: 61 fL — ABNORMAL LOW (ref 78.0–100.0)
MONOS PCT: 4 % (ref 3–12)
Monocytes Absolute: 0.3 10*3/uL (ref 0.1–1.0)
Neutro Abs: 5.4 10*3/uL (ref 1.7–7.7)
Neutrophils Relative %: 75 % (ref 43–77)
PLATELETS: 168 10*3/uL (ref 150–400)
RBC: 7.72 MIL/uL — ABNORMAL HIGH (ref 4.22–5.81)
RDW: 18.6 % — ABNORMAL HIGH (ref 11.5–15.5)
WBC: 7.2 10*3/uL (ref 4.0–10.5)

## 2013-07-23 LAB — HEMOGLOBIN A1C
Hgb A1c MFr Bld: 5.3 % (ref <5.7)
Mean Plasma Glucose: 105 mg/dL (ref <117)

## 2013-07-23 NOTE — Progress Notes (Signed)
Patient ID: Kevin BickersMichael Leung, male   DOB: [birth year], 58 y.o.   MRN: [id number]   Annual Screening Comprehensive Examination  This very nice 58 y.o.  [name] presents for complete physical.  Patient has been followed for HTN, Depression,  Prediabetes, Hyperlipidemia, and Vitamin D Deficiency.   HTN predates since [id number]. Patient's BP has been controlled at home.Today's BP: 134/82 mmHg. Patient denies any cardiac symptoms as chest pain, palpitations, shortness of breath, dizziness or ankle swelling.   Patient's hyperlipidemia is controlled with diet and medications. Patient denies myalgias or other medication SE's. Last cholesterol last visit in Mar 2015 was  at goal as below but triglycerides were elevated and patient was encouraged stricter diet.   Lab Results  Component Value Date   CHOL 136 05/09/2013   HDL 26* 05/09/2013   LDLCALC 49 05/09/2013   TRIG 304* 05/09/2013   CHOLHDL 5.2 05/09/2013    Patient has prediabetes with A1c 5.8% in Feb 2011 and last A1c was 5.1% in Aug 2014. Patient denies reactive hypoglycemic symptoms, visual blurring, diabetic polys or paresthesias.    Patient has long Hx/o anxiety and panic attacks and also long Hx/o depression followed by several different Psychiatrists since college days and current is seeing Lindaann SloughLisa Poulas with [facility].   Patient has Testosterone Deficiency and is on replacement with Depo-Testosterone with imp[roved sense of well-being & stamina. Finally, patient has history of Vitamin D Deficiency of 31 in 2008  and last vitamin D was 70 in Aug 2014.  Medication Sig  . allopurinol (ZYLOPRIM) 300 MG tablet Take 300 mg by mouth daily. Takes 1/2 tag daily  . atenolol (TENORMIN) 100 MG tablet Take 100 mg by mouth daily.  .  SYR 23GX1" 23G X 1" 3 ML  USE WITH TESTOSTERONE  . VITAMIN D 2000 UNITS tablet Take 2,000 Units by mouth 2 (two) times daily.  . enalapril (VASOTEC) 20 MG tablet TAKE 1 TABLET EVERY DAY FOR BLOOD PRESSURE  . NEURONTIN 800 MG tablet  TAKE 6 TABLETS DAILY OR AS DIRECTED FOR PAIN  . meloxicam (MOBIC) 15 MG tablet TAKE 1 TABLET EVERY DAY AS NEEDED FOR PAIN/INFLAMMATION  . methylphenidate SR 20 MG ER  Take 20 mg by mouth daily. Takes 3 per day  . mirtazapine (REMERON) 45 MG tablet Take 45 mg by mouth at bedtime.  Marland Kitchen. FISH OIL 1000 MG  Take 1,000 capsules by mouth.  Marland Kitchen. DEPO-TESTOSTERONE 200 MG/ML  Injects 1 ml every week   Allergies  Allergen Reactions  . Serzone [Nefazodone]     Leg cramps  . Trazamine [Trazodone & Diet Manage Prod] Other (See Comments)    confusion   Past Medical History  Diagnosis Date  . Hypertension   . Depression   . Hyperlipidemia   . Elevated hemoglobin A1c   . Anemia   . ADD (attention deficit disorder)   . Elevated hemoglobin A1c   . Vitamin D deficiency    Past Surgical History  Procedure Laterality Date  . Fracture surgery    . Orthopedic surgery Left     left foot pinning  . Orthopedic surgery Left     left foot pinning  . Orthopedic surgery Right     right elbow bursectomy  1984  . Orthopedic surgery Right 2002 dr supple    right rotator cuff   Family History  Problem Relation Age of Onset  . Cancer Mother     melonoma  . Hypertension Father   . Aortic stenosis Father  History   Social History  . Marital Status: Married x 23 years    Spouse Name: N/A    Number of Children: N/A  . Years of Education: N/A   Occupational History  . 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 Smoking status: Never Smoker   . Smokeless tobacco: Not on file  . Alcohol Use: No  . Drug Use: Not on file  . Sexual Activity: Not on file    ROS Constitutional: Denies fever, chills, weight loss/gain, headaches, insomnia, fatigue, night sweats or change in appetite. Eyes: Denies redness, blurred vision, diplopia, discharge, itchy or watery eyes.  ENT: Denies discharge,  congestion, post nasal drip, epistaxis, sore throat, earache, hearing loss, dental pain, Tinnitus, Vertigo, Sinus pain or snoring.  Cardio: Denies chest pain, palpitations, irregular heartbeat, syncope, dyspnea, diaphoresis, orthopnea, PND, claudication or edema Respiratory: denies cough, dyspnea, DOE, pleurisy, hoarseness, laryngitis or wheezing.  Gastrointestinal: Denies dysphagia, heartburn, reflux, water brash, pain, cramps, nausea, vomiting, bloating, diarrhea, constipation, hematemesis, melena, hematochezia, jaundice or hemorrhoids Genitourinary: Denies dysuria, frequency, urgency, nocturia, hesitancy, discharge, hematuria or flank pain Musculoskeletal: Denies arthralgia, myalgia, stiffness, Jt. Swelling, pain, limp or strain/sprain. Skin: Denies puritis, rash, hives, warts, acne, eczema or change in skin lesion Neuro: No weakness, tremor, incoordination, spasms, paresthesia or pain Psychiatric: Denies confusion, memory loss or sensory loss Endocrine: Denies change in weight, skin, hair change, nocturia, and paresthesia, diabetic polys, visual blurring or hyper / hypo glycemic episodes.  Heme/Lymph: No excessive bleeding, bruising or enlarged lymph nodes.  Physical Exam  BP 134/82  Pulse 56  Temp 98.2 F   Resp 16  Ht 5' 10.5"   Wt 181 lb   BMI 25.60 kg/m2  General Appearance: Well nourished, in no apparent distress. Eyes: PERRLA, EOMs, conjunctiva no swelling or erythema, normal fundi and vessels. Sinuses: No frontal/maxillary tenderness ENT/Mouth: EACs patent / TMs  nl. Nares clear without erythema, swelling, mucoid exudates. Oral hygiene is good. No erythema, swelling, or exudate. Tongue normal, non-obstructing. Tonsils not swollen or erythematous. Hearing normal.  Neck: Supple, thyroid normal. No bruits, nodes or JVD. Respiratory: Respiratory effort normal.  BS equal and clear bilateral without rales, rhonci, wheezing or stridor. Cardio: Heart sounds are normal with regular rate  and rhythm and no murmurs, rubs or gallops. Peripheral pulses are normal and equal bilaterally without edema. No aortic or femoral bruits. Chest: symmetric with normal excursions and percussion.  Abdomen: Flat, soft, with bowl sounds. Nontender, no guarding, rebound, hernias, masses, or organomegaly.  Lymphatics: Non tender without lymphadenopathy.  Genitourinary: No hernias.Testes nl. DRE - prostate nl for age - smooth & firm w/o nodules. Musculoskeletal: Full ROM all peripheral extremities, joint stability, 5/5 strength, and normal gait. Skin: Warm and dry without rashes, lesions, cyanosis, clubbing or  ecchymosis.  Neuro: Cranial nerves intact, reflexes equal bilaterally. Normal muscle tone, no cerebellar symptoms. Sensation intact.  Pysch: Awake and oriented X 3, normal affect, insight and judgment appropriate. No ideations or delusions.  Assessment and Plan  1. Annual Screening Examination 2. Hypertension  3. Hyperlipidemia 4. Pre Diabetes 5. Vitamin D Deficiency 6. Testosterone Deficiency 7. Depression  Continue prudent diet as discussed, weight control, BP monitoring, regular exercise, and medications as discussed.  Discussed med effects and SE's. Routine screening labs and tests as requested with regular follow-up as recommended.

## 2013-07-23 NOTE — Patient Instructions (Signed)

## 2013-07-24 LAB — HEPATIC FUNCTION PANEL
ALT: 42 U/L (ref 0–53)
AST: 58 U/L — AB (ref 0–37)
Albumin: 4.2 g/dL (ref 3.5–5.2)
Alkaline Phosphatase: 62 U/L (ref 39–117)
Bilirubin, Direct: 0.2 mg/dL (ref 0.0–0.3)
Indirect Bilirubin: 1 mg/dL (ref 0.2–1.2)
TOTAL PROTEIN: 6.5 g/dL (ref 6.0–8.3)
Total Bilirubin: 1.2 mg/dL (ref 0.2–1.2)

## 2013-07-24 LAB — BASIC METABOLIC PANEL WITH GFR
BUN: 11 mg/dL (ref 6–23)
CO2: 29 meq/L (ref 19–32)
CREATININE: 0.83 mg/dL (ref 0.50–1.35)
Calcium: 9.5 mg/dL (ref 8.4–10.5)
Chloride: 102 mEq/L (ref 96–112)
GFR, Est African American: >89 mL/min
GFR, Est Non African American: >89 mL/min
GLUCOSE: 78 mg/dL (ref 70–99)
Potassium: 4.3 mEq/L (ref 3.5–5.3)
Sodium: 136 mEq/L (ref 135–145)

## 2013-07-24 LAB — URINALYSIS, MICROSCOPIC ONLY
BACTERIA UA: NONE SEEN
Casts: NONE SEEN
Crystals: NONE SEEN
Squamous Epithelial / LPF: NONE SEEN

## 2013-07-24 LAB — TESTOSTERONE: Testosterone: 946 ng/dL — ABNORMAL HIGH (ref 300–890)

## 2013-07-24 LAB — MICROALBUMIN / CREATININE URINE RATIO
Creatinine, Urine: 228.6 mg/dL
MICROALB UR: 33 mg/dL — AB (ref 0.00–1.89)
Microalb Creat Ratio: 144.4 mg/g — ABNORMAL HIGH (ref 0.0–30.0)

## 2013-07-24 LAB — LIPID PANEL
CHOLESTEROL: 143 mg/dL (ref 0–200)
HDL: 24 mg/dL — AB (ref >39)
LDL Cholesterol: 86 mg/dL (ref 0–99)
TRIGLYCERIDES: 165 mg/dL — AB (ref <150)
Total CHOL/HDL Ratio: 6 Ratio
VLDL: 33 mg/dL (ref 0–40)

## 2013-07-24 LAB — HIV ANTIBODY (ROUTINE TESTING W REFLEX): HIV: NONREACTIVE

## 2013-07-24 LAB — HEPATITIS A ANTIBODY, TOTAL: HEP A TOTAL AB: REACTIVE — AB

## 2013-07-24 LAB — HEPATITIS B CORE ANTIBODY, TOTAL: HEP B C TOTAL AB: NONREACTIVE

## 2013-07-24 LAB — MAGNESIUM: Magnesium: 1.6 mg/dL (ref 1.5–2.5)

## 2013-07-24 LAB — VITAMIN B12: Vitamin B-12: 552 pg/mL (ref 211–911)

## 2013-07-24 LAB — RPR

## 2013-07-24 LAB — HEPATITIS C ANTIBODY: HCV AB: NEGATIVE

## 2013-07-24 LAB — HEPATITIS B SURFACE ANTIBODY,QUALITATIVE

## 2013-07-24 LAB — VITAMIN D 25 HYDROXY (VIT D DEFICIENCY, FRACTURES): Vit D, 25-Hydroxy: 56 ng/mL (ref 30–89)

## 2013-07-24 LAB — TSH: TSH: 1.368 u[IU]/mL (ref 0.350–4.500)

## 2013-07-24 LAB — PSA: PSA: 0.74 ng/mL (ref <=4.00)

## 2013-07-24 LAB — INSULIN, FASTING: Insulin fasting, serum: 2 u[IU]/mL — ABNORMAL LOW (ref 3–28)

## 2013-07-25 LAB — HEPATITIS B E ANTIBODY: HEPATITIS BE ANTIBODY: NONREACTIVE

## 2013-07-26 ENCOUNTER — Encounter: Payer: Self-pay | Admitting: Internal Medicine

## 2013-07-26 ENCOUNTER — Other Ambulatory Visit: Payer: Self-pay | Admitting: Physician Assistant

## 2013-07-31 ENCOUNTER — Other Ambulatory Visit: Payer: Self-pay | Admitting: Internal Medicine

## 2013-07-31 LAB — TB SKIN TEST
INDURATION: 0 mm
TB SKIN TEST: NEGATIVE

## 2013-08-27 ENCOUNTER — Other Ambulatory Visit (INDEPENDENT_AMBULATORY_CARE_PROVIDER_SITE_OTHER): Payer: BC Managed Care – PPO

## 2013-08-27 DIAGNOSIS — Z1212 Encounter for screening for malignant neoplasm of rectum: Secondary | ICD-10-CM

## 2013-08-27 LAB — POC HEMOCCULT BLD/STL (HOME/3-CARD/SCREEN)
Card #3 Fecal Occult Blood, POC: NEGATIVE
FECAL OCCULT BLD: NEGATIVE
Fecal Occult Blood, POC: NEGATIVE

## 2013-08-31 ENCOUNTER — Other Ambulatory Visit: Payer: Self-pay | Admitting: Emergency Medicine

## 2013-08-31 ENCOUNTER — Other Ambulatory Visit: Payer: Self-pay | Admitting: Physician Assistant

## 2013-10-28 ENCOUNTER — Ambulatory Visit (INDEPENDENT_AMBULATORY_CARE_PROVIDER_SITE_OTHER): Payer: BC Managed Care – PPO | Admitting: Physician Assistant

## 2013-10-28 ENCOUNTER — Encounter: Payer: Self-pay | Admitting: Physician Assistant

## 2013-10-28 VITALS — BP 130/82 | HR 56 | Temp 97.7°F | Resp 16 | Ht 70.0 in | Wt 181.0 lb

## 2013-10-28 DIAGNOSIS — D649 Anemia, unspecified: Secondary | ICD-10-CM

## 2013-10-28 DIAGNOSIS — M109 Gout, unspecified: Secondary | ICD-10-CM

## 2013-10-28 DIAGNOSIS — R7309 Other abnormal glucose: Secondary | ICD-10-CM

## 2013-10-28 DIAGNOSIS — I10 Essential (primary) hypertension: Secondary | ICD-10-CM

## 2013-10-28 DIAGNOSIS — F988 Other specified behavioral and emotional disorders with onset usually occurring in childhood and adolescence: Secondary | ICD-10-CM

## 2013-10-28 DIAGNOSIS — E782 Mixed hyperlipidemia: Secondary | ICD-10-CM

## 2013-10-28 DIAGNOSIS — Z79899 Other long term (current) drug therapy: Secondary | ICD-10-CM

## 2013-10-28 DIAGNOSIS — E559 Vitamin D deficiency, unspecified: Secondary | ICD-10-CM

## 2013-10-28 DIAGNOSIS — E291 Testicular hypofunction: Secondary | ICD-10-CM

## 2013-10-28 LAB — CBC WITH DIFFERENTIAL/PLATELET
Basophils Absolute: 0 10*3/uL (ref 0.0–0.1)
Basophils Relative: 0 % (ref 0–1)
Eosinophils Absolute: 0.1 10*3/uL (ref 0.0–0.7)
Eosinophils Relative: 1 % (ref 0–5)
HCT: 47.4 % (ref 39.0–52.0)
Hemoglobin: 16.1 g/dL (ref 13.0–17.0)
LYMPHS ABS: 2.5 10*3/uL (ref 0.7–4.0)
Lymphocytes Relative: 20 % (ref 12–46)
MCH: 20.4 pg — AB (ref 26.0–34.0)
MCHC: 34 g/dL (ref 30.0–36.0)
MCV: 60.1 fL — ABNORMAL LOW (ref 78.0–100.0)
Monocytes Absolute: 0.7 10*3/uL (ref 0.1–1.0)
Monocytes Relative: 6 % (ref 3–12)
NEUTROS PCT: 73 % (ref 43–77)
Neutro Abs: 9.1 10*3/uL — ABNORMAL HIGH (ref 1.7–7.7)
Platelets: 157 10*3/uL (ref 150–400)
RBC: 7.89 MIL/uL — AB (ref 4.22–5.81)
RDW: 18.2 % — AB (ref 11.5–15.5)
WBC: 12.4 10*3/uL — AB (ref 4.0–10.5)

## 2013-10-28 LAB — BASIC METABOLIC PANEL WITH GFR
BUN: 16 mg/dL (ref 6–23)
CO2: 33 meq/L — AB (ref 19–32)
CREATININE: 1.1 mg/dL (ref 0.50–1.35)
Calcium: 10 mg/dL (ref 8.4–10.5)
Chloride: 98 mEq/L (ref 96–112)
GFR, EST AFRICAN AMERICAN: 86 mL/min
GFR, EST NON AFRICAN AMERICAN: 74 mL/min
Glucose, Bld: 91 mg/dL (ref 70–99)
Potassium: 4.9 mEq/L (ref 3.5–5.3)
SODIUM: 138 meq/L (ref 135–145)

## 2013-10-28 LAB — HEPATIC FUNCTION PANEL
ALBUMIN: 4.8 g/dL (ref 3.5–5.2)
ALT: 37 U/L (ref 0–53)
AST: 48 U/L — ABNORMAL HIGH (ref 0–37)
Alkaline Phosphatase: 72 U/L (ref 39–117)
BILIRUBIN DIRECT: 0.3 mg/dL (ref 0.0–0.3)
BILIRUBIN TOTAL: 1.6 mg/dL — AB (ref 0.2–1.2)
Indirect Bilirubin: 1.3 mg/dL — ABNORMAL HIGH (ref 0.2–1.2)
Total Protein: 6.6 g/dL (ref 6.0–8.3)

## 2013-10-28 LAB — LIPID PANEL
CHOLESTEROL: 129 mg/dL (ref 0–200)
HDL: 27 mg/dL — AB (ref >39)
LDL Cholesterol: 46 mg/dL (ref 0–99)
TRIGLYCERIDES: 282 mg/dL — AB (ref <150)
Total CHOL/HDL Ratio: 4.8 Ratio
VLDL: 56 mg/dL — AB (ref 0–40)

## 2013-10-28 LAB — URIC ACID: Uric Acid, Serum: 6.6 mg/dL (ref 4.0–7.8)

## 2013-10-28 LAB — TSH: TSH: 2.055 u[IU]/mL (ref 0.350–4.500)

## 2013-10-28 LAB — MAGNESIUM: Magnesium: 1.9 mg/dL (ref 1.5–2.5)

## 2013-10-28 MED ORDER — GABAPENTIN 800 MG PO TABS: ORAL_TABLET | ORAL | Status: DC

## 2013-10-28 NOTE — Progress Notes (Signed)
Assessment and Plan:  Hypertension: Continue medication, monitor blood pressure at home. Continue DASH diet. Cholesterol: Continue diet and exercise. Check cholesterol.  Hypogonadism- continue replacement therapy, check testosterone levels as needed.  Vitamin D Def- check level and continue medications.  Gout- recheck Uric acid, Diet discussed.  ADD/depression- Continue to see psychiatrist,  Continue ADD medication, helps with focus, no AE's. The patient was counseled on the addictive nature of the medication and was encouraged to take drug holidays when not needed.     Continue diet and meds as discussed. Further disposition pending results of labs.  HPI 58 y.o. male  presents for 3 month follow up with hypertension, hyperlipidemia, prediabetes and vitamin D. His blood pressure has been controlled at home, today their BP is BP: 130/82 mmHg He does not workou, but has a very physical jobt. He denies chest pain, shortness of breath, dizziness.  He is not on cholesterol medication and denies myalgias. His cholesterol is at goal. The cholesterol last visit was:   Lab Results  Component Value Date   CHOL 143 07/23/2013   HDL 24* 07/23/2013   LDLCALC 86 07/23/2013   TRIG 165* 07/23/2013   CHOLHDL 6.0 07/23/2013   Last A1C in the office was:  Lab Results  Component Value Date   HGBA1C 5.3 07/23/2013   Patient is on Vitamin D supplement.   Lab Results  Component Value Date   VD25OH 71 07/23/2013     He is has a history of testosterone deficiency and is on testosterone replacement, he does 1 cc q week but recently he has been missing it so he did 2cc once a week, he was counseled to not do this. He states that the testosterone helps with his energy, libido, muscle mass. Lab Results  Component Value Date   TESTOSTERONE 946* 07/23/2013   Patient is on allopurinol for gout and does not report a recent flare.  Patient is on an ADD medication, he states that the medication is helping and he  denies any adverse reactions.    Current Medications:  Current Outpatient Prescriptions on File Prior to Visit  Medication Sig Dispense Refill  . allopurinol (ZYLOPRIM) 300 MG tablet Take 300 mg by mouth daily. Takes 1/2 tag daily      . atenolol (TENORMIN) 100 MG tablet TAKE 1 TABLET EVERY DAY  90 tablet  1  . B-D 3CC LUER-LOK SYR 23GX1" 23G X 1" 3 ML MISC USE WITH TESTOSTERONE  100 each  6  . Cholecalciferol (VITAMIN D) 2000 UNITS tablet Take 2,000 Units by mouth 2 (two) times daily.      . clonazePAM (KLONOPIN) 1 MG tablet Take 1 mg by mouth 2 (two) times daily as needed. Takes up to 2 tabs daily prn      . enalapril (VASOTEC) 20 MG tablet TAKE 1 TABLET EVERY DAY FOR BLOOD PRESSURE  90 tablet  3  . gabapentin (NEURONTIN) 800 MG tablet TAKE 6 TABLETS DAILY OR AS DIRECTED FOR PAIN  180 tablet  1  . meloxicam (MOBIC) 15 MG tablet TAKE 1 TABLET EVERY DAY AS NEEDED FOR PAIN/INFLAMMATION  90 tablet  0  . methylphenidate (RITALIN SR) 20 MG ER tablet Take 20 mg by mouth daily. Takes 3 per day      . mirtazapine (REMERON) 45 MG tablet Take 45 mg by mouth at bedtime.      . Omega-3 Fatty Acids (FISH OIL) 1000 MG CAPS Take 1,000 capsules by mouth.      Marland Kitchen  risperiDONE (RISPERDAL) 2 MG tablet Take 2 mg by mouth at bedtime.      Marland Kitchen testosterone cypionate (DEPOTESTOTERONE CYPIONATE) 200 MG/ML injection INJECT 1 ML ONCE A WEEK  10 mL  2   No current facility-administered medications on file prior to visit.   Medical History:  Past Medical History  Diagnosis Date  . Hypertension   . Depression   . Hyperlipidemia   . Elevated hemoglobin A1c   . Anemia   . ADD (attention deficit disorder)   . Elevated hemoglobin A1c   . Vitamin D deficiency    Allergies:  Allergies  Allergen Reactions  . Serzone [Nefazodone]     Leg cramps  . Trazamine [Trazodone & Diet Manage Prod] Other (See Comments)    confusion    Review of Systems:  = complains of   = denies  General: Fatigue  Fever   Chills  Weakness   Insomnia  Eyes: Redness  Blurred vision  Diplopia   ENT: Congestion  Sinus Pain  Post Nasal Drip  Sore Throat  Earache   Cardiac: Chest pain/pressure  SOB  Orthopnea   Palpitations   Paroxysmal nocturnal dyspnea[ ]  Claudication  Edema   Pulmonary: Cough  Wheezing[ ]   SOB   Snoring   GI: Nausea  Vomiting[ ]  Dysphagia[ ]  Heartburn[ ]  Abdominal pain  Constipation ; Diarrhea ; BRBPR  Melena[ ]  GU: Hematuria[ ]  Dysuria  Nocturia[ ]  Urgency   Hesitancy  Discharge  Neuro: Headaches[ ]  Vertigo[ ]  Paresthesias[ ]  Spasm  Speech changes  Incoordination   Ortho: Arthritis  Joint pain [x ] Muscle pain [x ] Joint swelling  Back Pain  Skin:  Rash   Pruritis  Change in skin lesion   Psych: Depression[ ]  Anxiety[ ]  Confusion  Memory loss   Heme/Lypmh: Bleeding  Bruising  Enlarged lymph nodes   Endocrine: Visual blurring  Paresthesia  Polyuria  Polydypsea    Heat/cold intolerance  Hypoglycemia   Family history- Review and unchanged Social history- Review and unchanged Physical Exam: BP 130/82  Pulse 56  Temp(Src) 97.7 F (36.5 C)  Resp 16  Ht  (1.778 m)  Wt 181 lb (82.101 kg)  BMI 25.97 kg/m2 Wt Readings from Last 3 Encounters:  10/28/13 181 lb (82.101 kg)  07/23/13 181 lb (82.101 kg)  05/09/13 187 lb (84.823 kg)   General Appearance: Well nourished, in no apparent distress. Eyes: PERRLA, EOMs, conjunctiva no swelling or erythema Sinuses: No Frontal/maxillary tenderness ENT/Mouth: Ext aud canals clear, TMs without erythema, bulging. No erythema, swelling, or exudate on post pharynx.  Tonsils not swollen or erythematous. Hearing normal.  Neck: Supple, thyroid normal.  Respiratory: Respiratory effort normal, BS equal bilaterally without rales, rhonchi, wheezing or stridor.  Cardio: RRR with no MRGs. Brisk peripheral pulses without  edema.  Abdomen: Soft, + BS.  Non tender, no guarding, rebound, hernias, masses. Lymphatics: Non tender without lymphadenopathy.  Musculoskeletal: Full ROM, 5/5 strength, normal gait.  Skin: Warm, dry without rashes, lesions, ecchymosis.  Neuro: Cranial nerves intact. Normal muscle tone, no cerebellar symptoms. Sensation intact.  Psych: Awake and oriented X 3, normal affect, Insight and Judgment appropriate.    Kevin Gomez 9:38 AM

## 2013-10-28 NOTE — Patient Instructions (Signed)
YYour LDL is the bad cholesterol that can lead to heart attack and stroke. To lower your number you can decrease your fatty foods, red meat, cheese, milk and increase fiber like whole grains and veggies. You can also add a fiber supplement like Metamucil or Benefiber.   DASH Eating Plan DASH stands for "Dietary Approaches to Stop Hypertension." The DASH eating plan is a healthy eating plan that has been shown to reduce high blood pressure (hypertension). Additional health benefits may include reducing the risk of type 2 diabetes mellitus, heart disease, and stroke. The DASH eating plan may also help with weight loss. WHAT DO I NEED TO KNOW ABOUT THE DASH EATING PLAN? For the DASH eating plan, you will follow these general guidelines:  Choose foods with a percent daily value for sodium of less than 5% (as listed on the food label).  Use salt-free seasonings or herbs instead of table salt or sea salt.  Check with your health care provider or pharmacist before using salt substitutes.  Eat lower-sodium products, often labeled as "lower sodium" or "no salt added."  Eat fresh foods.  Eat more vegetables, fruits, and low-fat dairy products.  Choose whole grains. Look for the word "whole" as the first word in the ingredient list.  Choose fish and skinless chicken or [place] more often than red meat. Limit fish, poultry, and meat to 6 oz (170 g) each day.  Limit sweets, desserts, sugars, and sugary drinks.  Choose heart-healthy fats.  Limit cheese to 1 oz (28 g) per day.  Eat more home-cooked food and less restaurant, buffet, and fast food.  Limit fried foods.  Cook foods using methods other than frying.  Limit canned vegetables. If you do use them, rinse them well to decrease the sodium.  When eating at a restaurant, ask that your food be prepared with less salt, or no salt if possible. WHAT FOODS CAN I EAT? Seek help from a dietitian for individual calorie needs. Grains Whole grain  or whole wheat bread. Brown rice. Whole grain or whole wheat pasta. Quinoa, bulgur, and whole grain cereals. Low-sodium cereals. Corn or whole wheat flour tortillas. Whole grain cornbread. Whole grain crackers. Low-sodium crackers. Vegetables Fresh or frozen vegetables (raw, steamed, roasted, or grilled). Low-sodium or reduced-sodium tomato and vegetable juices. Low-sodium or reduced-sodium tomato sauce and paste. Low-sodium or reduced-sodium canned vegetables.  Fruits All fresh, canned (in natural juice), or frozen fruits. Meat and Other Protein Products Ground beef (85% or leaner), grass-fed beef, or beef trimmed of fat. Skinless chicken or [place]. Ground chicken or [place]. Pork trimmed of fat. All fish and seafood. Eggs. Dried beans, peas, or lentils. Unsalted nuts and seeds. Unsalted canned beans. Dairy Low-fat dairy products, such as skim or 1% milk, 2% or reduced-fat cheeses, low-fat ricotta or cottage cheese, or plain low-fat yogurt. Low-sodium or reduced-sodium cheeses. Fats and Oils Tub margarines without trans fats. Light or reduced-fat mayonnaise and salad dressings (reduced sodium). Avocado. Safflower, olive, or canola oils. Natural peanut or almond butter. Other Unsalted popcorn and pretzels. The items listed above may not be a complete list of recommended foods or beverages. Contact your dietitian for more options. WHAT FOODS ARE NOT RECOMMENDED? Grains White bread. White pasta. White rice. Refined cornbread. Bagels and croissants. Crackers that contain trans fat. Vegetables Creamed or fried vegetables. Vegetables in a cheese sauce. Regular canned vegetables. Regular canned tomato sauce and paste. Regular tomato and vegetable juices. Fruits Dried fruits. Canned fruit in light or heavy syrup. Fruit  juice. Meat and Other Protein Products Fatty cuts of meat. Ribs, chicken wings, bacon, sausage, bologna, salami, chitterlings, fatback, hot dogs, bratwurst, and packaged luncheon meats.  Salted nuts and seeds. Canned beans with salt. Dairy Whole or 2% milk, cream, half-and-half, and cream cheese. Whole-fat or sweetened yogurt. Full-fat cheeses or blue cheese. Nondairy creamers and whipped toppings. Processed cheese, cheese spreads, or cheese curds. Condiments Onion and garlic salt, seasoned salt, table salt, and sea salt. Canned and packaged gravies. Worcestershire sauce. Tartar sauce. Barbecue sauce. Teriyaki sauce. Soy sauce, including reduced sodium. Steak sauce. Fish sauce. Oyster sauce. Cocktail sauce. Horseradish. Ketchup and mustard. Meat flavorings and tenderizers. Bouillon cubes. Hot sauce. Tabasco sauce. Marinades. Taco seasonings. Relishes. Fats and Oils Butter, stick margarine, lard, shortening, ghee, and bacon fat. Coconut, palm kernel, or palm oils. Regular salad dressings. Other Pickles and olives. Salted popcorn and pretzels. The items listed above may not be a complete list of foods and beverages to avoid. Contact your dietitian for more information. WHERE CAN I FIND MORE INFORMATION? National Heart, Lung, and Blood Institute: [employer] Document Released: 02/09/2011 Document Revised: 07/07/2013 Document Reviewed: 12/25/2012 [facility] Patient Information 2015 [place], [place]. This information is not intended to replace advice given to you by your health care provider. Make sure you discuss any questions you have with your health care provider.

## 2013-10-29 LAB — VITAMIN D 25 HYDROXY (VIT D DEFICIENCY, FRACTURES): VIT D 25 HYDROXY: 75 ng/mL (ref 30–89)

## 2013-10-31 ENCOUNTER — Other Ambulatory Visit: Payer: Self-pay | Admitting: Emergency Medicine

## 2013-11-25 ENCOUNTER — Other Ambulatory Visit: Payer: Self-pay | Admitting: Emergency Medicine

## 2014-01-26 ENCOUNTER — Other Ambulatory Visit: Payer: Self-pay | Admitting: Emergency Medicine

## 2014-02-17 ENCOUNTER — Ambulatory Visit: Payer: Self-pay | Admitting: Internal Medicine

## 2014-02-22 ENCOUNTER — Other Ambulatory Visit: Payer: Self-pay | Admitting: Internal Medicine

## 2014-02-23 ENCOUNTER — Other Ambulatory Visit: Payer: Self-pay | Admitting: Physician Assistant

## 2014-03-01 ENCOUNTER — Other Ambulatory Visit: Payer: Self-pay | Admitting: Internal Medicine

## 2014-03-03 ENCOUNTER — Encounter: Payer: Self-pay | Admitting: Internal Medicine

## 2014-03-19 ENCOUNTER — Ambulatory Visit (INDEPENDENT_AMBULATORY_CARE_PROVIDER_SITE_OTHER): Payer: BLUE CROSS/BLUE SHIELD | Admitting: Internal Medicine

## 2014-03-19 ENCOUNTER — Encounter: Payer: Self-pay | Admitting: Internal Medicine

## 2014-03-19 VITALS — BP 176/102 | HR 56 | Temp 98.2°F | Resp 16 | Ht 70.0 in | Wt 170.8 lb

## 2014-03-19 DIAGNOSIS — R7309 Other abnormal glucose: Secondary | ICD-10-CM

## 2014-03-19 DIAGNOSIS — F329 Major depressive disorder, single episode, unspecified: Secondary | ICD-10-CM

## 2014-03-19 DIAGNOSIS — F988 Other specified behavioral and emotional disorders with onset usually occurring in childhood and adolescence: Secondary | ICD-10-CM

## 2014-03-19 DIAGNOSIS — E559 Vitamin D deficiency, unspecified: Secondary | ICD-10-CM

## 2014-03-19 DIAGNOSIS — Z79899 Other long term (current) drug therapy: Secondary | ICD-10-CM

## 2014-03-19 DIAGNOSIS — F319 Bipolar disorder, unspecified: Secondary | ICD-10-CM | POA: Insufficient documentation

## 2014-03-19 DIAGNOSIS — M1000 Idiopathic gout, unspecified site: Secondary | ICD-10-CM

## 2014-03-19 DIAGNOSIS — F909 Attention-deficit hyperactivity disorder, unspecified type: Secondary | ICD-10-CM

## 2014-03-19 DIAGNOSIS — M109 Gout, unspecified: Secondary | ICD-10-CM

## 2014-03-19 DIAGNOSIS — I10 Essential (primary) hypertension: Secondary | ICD-10-CM

## 2014-03-19 DIAGNOSIS — E291 Testicular hypofunction: Secondary | ICD-10-CM

## 2014-03-19 DIAGNOSIS — F32A Depression, unspecified: Secondary | ICD-10-CM

## 2014-03-19 DIAGNOSIS — E782 Mixed hyperlipidemia: Secondary | ICD-10-CM

## 2014-03-19 HISTORY — DX: Gout, unspecified: M10.9

## 2014-03-19 HISTORY — DX: Other long term (current) drug therapy: Z79.899

## 2014-03-19 NOTE — Patient Instructions (Signed)

## 2014-03-19 NOTE — Progress Notes (Signed)
Patient ID: Kevin BickersMichael Gomez, male   DOB: 1955-11-21, 59 y.o.   MRN: [id number]   This very nice 59 y.o.[name] presents for 3 month follow up with Hypertension, Hyperlipidemia, Pre-Diabetes and Vitamin D Deficiency.    Patient is treated for HTN & BP has been controlled at home. Today's BP: (!) 176/102 mmHg. Patient has had no complaints of any cardiac type chest pain, palpitations, dyspnea/orthopnea/PND, dizziness, claudication, or dependent edema.   Hyperlipidemia is controlled with diet & meds. Patient denies myalgias or other med SE's. Last Lipids were at goal - Total  Cholesterol, 129; HDL  27; LDL  46;with elevated  Trig 282 on 10/28/2013.   Also, the patient has history of PreDiabetes and has had no symptoms of reactive hypoglycemia, diabetic polys, paresthesias or visual blurring.  Last A1c was 07/23/2013: Hemoglobin-A1c 5.3% on     Further, the patient also has history of Vitamin D Deficiency and supplements vitamin D without any suspected side-effects. Last vitamin D was 10/28/2013: Vit D, 25-Hydroxy 75 on    Medication Sig  . allopurinol (ZYLOPRIM) 300 MG tablet Take 300 mg by mouth daily. Takes 1/2 tag daily  . atenolol  100 MG tablet TAKE 1 TABLET EVERY DAY  . VITAMIN D 2000 UNITS  Take 2,000 Units by mouth 2 (two) times daily.  . clonazePAM (KLONOPIN) 1 MG tablet Take 1 mg by mouth 2 (two) times daily as needed. Takes up to 2 tabs daily prn  . enalapril 20 MG tablet TAKE 1 TABLET EVERY DAY FOR BLOOD PRESSURE  . Fluvoxamine Maleate 150 MG CP24 Take 150 mg by mouth at bedtime.  . gabapentin  800 MG tablet TAKE 6 TABLETS DAILY OR AS DIRECTED FOR PAIN  . meloxicam 15 MG tablet TAKE 1 TABLET EVERY DAY AS NEEDED FOR PAIN/INFLAMMATION  . Methylphenidate SR 20 MG ER  Take 20 mg by mouth daily. Takes 3 per day  . mirtazapine (REMERON) 45 MG tablet Take 45 mg by mouth at bedtime.  Marland Kitchen. FISH OIL 1000 MG  Take 1,000 capsules by mouth.  . risperiDONE (RISPERDAL) 2 MG tablet Take 2 mg by mouth at bedtime.   . DEPOTESTOTERONE 200 MG/ML inj INJECT 2 ML I-M EVERY 2 WEEKS   Allergies  Allergen Reactions  . Serzone [Nefazodone]     Leg cramps  . Trazamine [Trazodone & Diet Manage Prod] Other (See Comments)    confusion   PMHx:   Past Medical History  Diagnosis Date  . Hypertension   . Depression   . Hyperlipidemia   . Elevated hemoglobin A1c   . Anemia   . ADD (attention deficit disorder)   . Elevated hemoglobin A1c   . Vitamin D deficiency    Immunization History  Administered Date(s) Administered  . PPD Test 07/23/2013  . Pneumococcal-Unspecified 03/06/2009  . Td 03/24/2012   Past Surgical History  Procedure Laterality Date  . Fracture surgery    . Orthopedic surgery Left     left foot pinning  . Orthopedic surgery Left     left foot pinning  . Orthopedic surgery Right     right elbow bursectomy  1984  . Orthopedic surgery Right 2002 dr supple    right rotator cuff   FHx:    Reviewed / unchanged  SHx:    Reviewed / unchanged  Systems Review:  Constitutional: Denies fever, chills, wt changes, headaches, insomnia, fatigue, night sweats, change in appetite. Eyes: Denies redness, blurred vision, diplopia, discharge, itchy, watery eyes.  ENT: Denies  discharge, congestion, post nasal drip, epistaxis, sore throat, earache, hearing loss, dental pain, tinnitus, vertigo, sinus pain, snoring.  CV: Denies chest pain, palpitations, irregular heartbeat, syncope, dyspnea, diaphoresis, orthopnea, PND, claudication or edema. Respiratory: denies cough, dyspnea, DOE, pleurisy, hoarseness, laryngitis, wheezing.  Gastrointestinal: Denies dysphagia, odynophagia, heartburn, reflux, water brash, abdominal pain or cramps, nausea, vomiting, bloating, diarrhea, constipation, hematemesis, melena, hematochezia  or hemorrhoids. Genitourinary: Denies dysuria, frequency, urgency, nocturia, hesitancy, discharge, hematuria or flank pain. Musculoskeletal: Denies arthralgias, myalgias, stiffness, jt.  swelling, pain, limping or strain/sprain.  Skin: Denies pruritus, rash, hives, warts, acne, eczema or change in skin lesion(s). Neuro: No weakness, tremor, incoordination, spasms, paresthesia or pain. Psychiatric: Denies confusion, memory loss or sensory loss. Endo: Denies change in weight, skin or hair change.  Heme/Lymph: No excessive bleeding, bruising or enlarged lymph nodes.  Physical Exam  BP 176/102 -->> re-ck'd at 142/86  Pulse 56  Temp 98.2 F   Resp 16  Ht    Wt 170 lb 12.8 oz     BMI 24.51   Appears well nourished and in no distress. Eyes: PERRLA, EOMs, conjunctiva no swelling or erythema. Sinuses: No frontal/maxillary tenderness ENT/Mouth: EAC's clear, TM's nl w/o erythema, bulging. Nares clear w/o erythema, swelling, exudates. Oropharynx clear without erythema or exudates. Oral hygiene is good. Tongue normal, non obstructing. Hearing intact.  Neck: Supple. Thyroid nl. Car 2+/2+ without bruits, nodes or JVD. Chest: Respirations nl with BS clear & equal w/o rales, rhonchi, wheezing or stridor.  Cor: Heart sounds normal w/ regular rate and rhythm without sig. murmurs, gallops, clicks, or rubs. Peripheral pulses normal and equal  without edema.  Abdomen: Soft & bowel sounds normal. Non-tender w/o guarding, rebound, hernias, masses, or organomegaly.  Lymphatics: Unremarkable.  Musculoskeletal: Full ROM all peripheral extremities, joint stability, 5/5 strength, and normal gait.  Skin: Warm, dry without exposed rashes, lesions or ecchymosis apparent.  Neuro: Cranial nerves intact, reflexes equal bilaterally. Sensory-motor testing grossly intact. Tendon reflexes grossly intact.  Pysch: Alert & oriented x 3.  Insight and judgement nl & appropriate. Flat affect - forces smiles. No ideations.  Assessment and Plan:  1. Essential hypertension  - TSH  2. Hyperlipidemia  - Lipid panel  3. PreDiabetes  - Hemoglobin A1c - Insulin, fasting  4. Vitamin D deficiency  -  Vit D  25 hydroxy (rtn osteoporosis monitoring)  5. ADD (attention deficit disorder)   6. Testosterone Deficiency   7. Medication management  - CBC with Differential - BASIC METABOLIC PANEL WITH GFR - Hepatic function panel - Magnesium  8. Idiopathic gout, unspecified chronicity, unspecified site   9. Depression   Recommended regular exercise, BP monitoring, weight control, and discussed med and SE's. Recommended labs to assess and monitor clinical status. Further disposition pending results of labs. 3 mo f/u.

## 2014-03-20 LAB — CBC WITH DIFFERENTIAL/PLATELET
BASOS ABS: 0 10*3/uL (ref 0.0–0.1)
Basophils Relative: 0 % (ref 0–1)
Eosinophils Absolute: 0 10*3/uL (ref 0.0–0.7)
Eosinophils Relative: 0 % (ref 0–5)
HCT: 45.5 % (ref 39.0–52.0)
Hemoglobin: 14.9 g/dL (ref 13.0–17.0)
Lymphocytes Relative: 18 % (ref 12–46)
Lymphs Abs: 2 10*3/uL (ref 0.7–4.0)
MCH: 20.3 pg — ABNORMAL LOW (ref 26.0–34.0)
MCHC: 32.7 g/dL (ref 30.0–36.0)
MCV: 62.1 fL — ABNORMAL LOW (ref 78.0–100.0)
Monocytes Absolute: 0.7 10*3/uL (ref 0.1–1.0)
Monocytes Relative: 6 % (ref 3–12)
Neutro Abs: 8.6 10*3/uL — ABNORMAL HIGH (ref 1.7–7.7)
Neutrophils Relative %: 76 % (ref 43–77)
PLATELETS: 195 10*3/uL (ref 150–400)
RBC: 7.33 MIL/uL — ABNORMAL HIGH (ref 4.22–5.81)
RDW: 18.1 % — AB (ref 11.5–15.5)
WBC: 11.3 10*3/uL — AB (ref 4.0–10.5)

## 2014-03-20 LAB — BASIC METABOLIC PANEL WITH GFR
BUN: 16 mg/dL (ref 6–23)
CALCIUM: 9.8 mg/dL (ref 8.4–10.5)
CO2: 28 meq/L (ref 19–32)
CREATININE: 0.83 mg/dL (ref 0.50–1.35)
Chloride: 99 mEq/L (ref 96–112)
GFR, Est Non African American: >89 mL/min
GLUCOSE: 70 mg/dL (ref 70–99)
Potassium: 4.5 mEq/L (ref 3.5–5.3)
SODIUM: 138 meq/L (ref 135–145)

## 2014-03-20 LAB — HEMOGLOBIN A1C
Hgb A1c MFr Bld: 5.3 % (ref <5.7)
MEAN PLASMA GLUCOSE: 105 mg/dL (ref <117)

## 2014-03-20 LAB — HEPATIC FUNCTION PANEL
ALK PHOS: 68 U/L (ref 39–117)
ALT: 48 U/L (ref 0–53)
AST: 72 U/L — ABNORMAL HIGH (ref 0–37)
Albumin: 4.3 g/dL (ref 3.5–5.2)
BILIRUBIN DIRECT: 0.2 mg/dL (ref 0.0–0.3)
Indirect Bilirubin: 1 mg/dL (ref 0.2–1.2)
Total Bilirubin: 1.2 mg/dL (ref 0.2–1.2)
Total Protein: 6.3 g/dL (ref 6.0–8.3)

## 2014-03-20 LAB — MAGNESIUM: Magnesium: 1.9 mg/dL (ref 1.5–2.5)

## 2014-03-20 LAB — LIPID PANEL
Cholesterol: 158 mg/dL (ref 0–200)
HDL: 30 mg/dL — ABNORMAL LOW (ref >39)
LDL CALC: 90 mg/dL (ref 0–99)
Total CHOL/HDL Ratio: 5.3 Ratio
Triglycerides: 188 mg/dL — ABNORMAL HIGH (ref <150)
VLDL: 38 mg/dL (ref 0–40)

## 2014-03-20 LAB — TSH: TSH: 2.083 u[IU]/mL (ref 0.350–4.500)

## 2014-03-20 LAB — VITAMIN D 25 HYDROXY (VIT D DEFICIENCY, FRACTURES): VIT D 25 HYDROXY: 53 ng/mL (ref 30–100)

## 2014-03-20 LAB — INSULIN, FASTING: INSULIN FASTING, SERUM: 1.9 u[IU]/mL — AB (ref 2.0–19.6)

## 2014-04-06 ENCOUNTER — Other Ambulatory Visit: Payer: Self-pay | Admitting: *Deleted

## 2014-04-06 MED ORDER — ALLOPURINOL 300 MG PO TABS: 300.0000 mg | ORAL_TABLET | Freq: Every day | ORAL | Status: DC

## 2014-04-06 MED ORDER — ENALAPRIL MALEATE 20 MG PO TABS: ORAL_TABLET | ORAL | Status: DC

## 2014-04-06 MED ORDER — TESTOSTERONE CYPIONATE 200 MG/ML IM SOLN: INTRAMUSCULAR | Status: DC

## 2014-04-06 MED ORDER — MELOXICAM 15 MG PO TABS: ORAL_TABLET | ORAL | Status: DC

## 2014-04-06 MED ORDER — "NEEDLE (DISP) 21G X 1"" MISC": Status: DC

## 2014-05-26 ENCOUNTER — Other Ambulatory Visit: Payer: Self-pay | Admitting: *Deleted

## 2014-05-26 MED ORDER — GABAPENTIN 800 MG PO TABS: ORAL_TABLET | ORAL | Status: DC

## 2014-06-13 ENCOUNTER — Other Ambulatory Visit: Payer: Self-pay | Admitting: Physician Assistant

## 2014-07-24 ENCOUNTER — Ambulatory Visit (INDEPENDENT_AMBULATORY_CARE_PROVIDER_SITE_OTHER): Payer: Managed Care, Other (non HMO) | Admitting: Internal Medicine

## 2014-07-24 ENCOUNTER — Encounter: Payer: Self-pay | Admitting: Internal Medicine

## 2014-07-24 VITALS — BP 104/66 | HR 52 | Temp 97.7°F | Resp 16 | Ht 70.0 in | Wt 166.4 lb

## 2014-07-24 DIAGNOSIS — I10 Essential (primary) hypertension: Secondary | ICD-10-CM

## 2014-07-24 DIAGNOSIS — Z1212 Encounter for screening for malignant neoplasm of rectum: Secondary | ICD-10-CM

## 2014-07-24 DIAGNOSIS — M1000 Idiopathic gout, unspecified site: Secondary | ICD-10-CM

## 2014-07-24 DIAGNOSIS — Z79899 Other long term (current) drug therapy: Secondary | ICD-10-CM

## 2014-07-24 DIAGNOSIS — Z111 Encounter for screening for respiratory tuberculosis: Secondary | ICD-10-CM

## 2014-07-24 DIAGNOSIS — R7309 Other abnormal glucose: Secondary | ICD-10-CM

## 2014-07-24 DIAGNOSIS — E291 Testicular hypofunction: Secondary | ICD-10-CM

## 2014-07-24 DIAGNOSIS — R5383 Other fatigue: Secondary | ICD-10-CM

## 2014-07-24 DIAGNOSIS — E559 Vitamin D deficiency, unspecified: Secondary | ICD-10-CM

## 2014-07-24 DIAGNOSIS — E782 Mixed hyperlipidemia: Secondary | ICD-10-CM

## 2014-07-24 DIAGNOSIS — Z125 Encounter for screening for malignant neoplasm of prostate: Secondary | ICD-10-CM

## 2014-07-24 LAB — BASIC METABOLIC PANEL WITH GFR
BUN: 13 mg/dL (ref 6–23)
CALCIUM: 9.3 mg/dL (ref 8.4–10.5)
CHLORIDE: 102 meq/L (ref 96–112)
CO2: 28 mEq/L (ref 19–32)
CREATININE: 0.93 mg/dL (ref 0.50–1.35)
Glucose, Bld: 87 mg/dL (ref 70–99)
Potassium: 4.4 mEq/L (ref 3.5–5.3)
Sodium: 138 mEq/L (ref 135–145)

## 2014-07-24 LAB — HEPATIC FUNCTION PANEL
ALT: 40 U/L (ref 0–53)
AST: 44 U/L — ABNORMAL HIGH (ref 0–37)
Albumin: 3.9 g/dL (ref 3.5–5.2)
Alkaline Phosphatase: 92 U/L (ref 39–117)
Bilirubin, Direct: 0.1 mg/dL (ref 0.0–0.3)
Indirect Bilirubin: 0.6 mg/dL (ref 0.2–1.2)
TOTAL PROTEIN: 6 g/dL (ref 6.0–8.3)
Total Bilirubin: 0.7 mg/dL (ref 0.2–1.2)

## 2014-07-24 LAB — URIC ACID: Uric Acid, Serum: 6.3 mg/dL (ref 4.0–7.8)

## 2014-07-24 LAB — LIPID PANEL
CHOL/HDL RATIO: 4.9 ratio
Cholesterol: 152 mg/dL (ref 0–200)
HDL: 31 mg/dL — ABNORMAL LOW (ref >=40)
LDL Cholesterol: 92 mg/dL (ref 0–99)
Triglycerides: 143 mg/dL (ref <150)
VLDL: 29 mg/dL (ref 0–40)

## 2014-07-24 LAB — TSH: TSH: 1.658 u[IU]/mL (ref 0.350–4.500)

## 2014-07-24 LAB — IRON AND TIBC
%SAT: 25 % (ref 20–55)
IRON: 105 ug/dL (ref 42–165)
TIBC: 416 ug/dL (ref 215–435)
UIBC: 311 ug/dL (ref 125–400)

## 2014-07-24 LAB — VITAMIN B12: VITAMIN B 12: 860 pg/mL (ref 211–911)

## 2014-07-24 LAB — MAGNESIUM: Magnesium: 1.8 mg/dL (ref 1.5–2.5)

## 2014-07-24 NOTE — Progress Notes (Signed)
Patient ID: Kevin BickersMichael Gomez, male   DOB: [birth year], 59 y.o.   MRN: [id number]  Annual Comprehensive Examination  This very nice 59 y.o. [name] presents for complete physical.  Patient has been followed for HTN, Prediabetes, Hyperlipidemia, and Vitamin D Deficiency. Patient has long hx/o endogenous Depression and had been followed for years by Dr Tacey HeapAlex Myers in [facility] and more recently he's been followed Marlon PelLisa PoulisNP at [facility].    HTN predates since 1998. Patient's BP has been controlled at home.Today's BP: 104/66 mmHg. Patient denies any cardiac symptoms as chest pain, palpitations, shortness of breath, dizziness or ankle swelling.   Patient's hyperlipidemia is controlled with diet and medications. Patient denies myalgias or other medication SE's. Last lipids were at goal - Total Chol 158; HDL-C 30; LDL 90; Trig 188 on 03/19/2014.   Patient has prediabetes with A1c 5.8% since May  2010 & Feb 2011 and patient denies reactive hypoglycemic symptoms, visual blurring, diabetic polys or paresthesias. Last A1c was 03/19/2014: Hemoglobin-A1c 5.3% on        Finally, patient has history of Vitamin D Deficiency of  31 in 2008  and last vitamin D was 03/19/2014: VITD 53 on       Medication Sig  . allopurinol  300 MG tab Take 1 tablet (300 mg total) by mouth daily.  . Atenolol 100 MG tab TAKE 1 TABLET EVERY DAY  . VITAMIN D 2000 UNITS tab Take 2,000 Units by mouth 3 (three) times daily.   . clonazePAM  1 MG tab Take 1 mg by mouth 2 (two) times daily as needed. Takes up to 2 tabs daily prn  . Enalapril  20 MG tab TAKE 1 TABLET EVERY DAY FOR BLOOD PRESSURE  . Fluvoxamine  150 MG CP24 TAKE ONE CAPSULE BY MOUTH AT BEDTIME  . gabapentin  800 MG tab TAKE 6 TABLETS DAILY OR AS DIRECTED FOR PAIN  . meloxicam  15 MG tab TAKE 1 TABLET EVERY DAY AS NEEDED FOR PAIN/INFLAMMATION  . methylphenidate 20 MG ER tab Take 20 mg by mouth daily. Takes 3 per day  . mirtazapine  45 MG tab Take 45 mg by mouth at bedtime.  Marland Kitchen.  FISH OIL 1000 MG CAP Take 1,000 capsules by mouth.  Marland Kitchen. DEPO-TESTOSTERONE 200 MG/ML inj INJECT 2 ML IM EVERY 2 WEEKS  . REXULTI 1 MG TABS 2 mg at bedtime.    Allergies  Allergen Reactions  . Serzone [Nefazodone]     Leg cramps  . Trazamine [Trazodone & Diet Manage Prod] Other (See Comments)    confusion   Past Medical History  Diagnosis Date  . Hypertension   . Depression   . Hyperlipidemia   . Elevated hemoglobin A1c   . Anemia   . ADD (attention deficit disorder)   . Elevated hemoglobin A1c   . Vitamin D deficiency    Health Maintenance  Topic Date Due  . INFLUENZA VACCINE  10/05/2014  . COLONOSCOPY  08/18/2019  . TETANUS/TDAP  03/24/2022  . HIV Screening  Completed   Immunization History  Administered Date(s) Administered  . PPD Test 07/23/2013, 07/24/2014  . Pneumococcal-Unspecified 03/06/2009  . Td 03/24/2012   Past Surgical History  Procedure Laterality Date  . Fracture surgery    . Orthopedic surgery Left     left foot pinning  . Orthopedic surgery Left     left foot pinning  . Orthopedic surgery Right     right elbow bursectomy  1984  . Orthopedic surgery Right  2002 dr supple    right rotator cuff   Family History  Problem Relation Age of Onset  . Cancer Mother     melonoma  . Hypertension Father   . Aortic stenosis Father    History   Social History  . Marital Status: Married    Spouse Name: N/A  . Number of Children: N/A  . Years of Education: N/A   Occupational History  . 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 Smoking status: Never Smoker   . Smokeless tobacco: No  . Alcohol Use:  illicit  . Drug Use:   . Sexual Activity: Active    ROS Constitutional: Denies fever, chills, weight loss/gain, headaches, insomnia,  night sweats or change in appetite. Does c/o fatigue. Eyes: Denies redness, blurred vision, diplopia, discharge, itchy or watery eyes.  ENT: Denies discharge, congestion, post nasal  drip, epistaxis, sore throat, earache, hearing loss, dental pain, Tinnitus, Vertigo, Sinus pain or snoring.  Cardio: Denies chest pain, palpitations, irregular heartbeat, syncope, dyspnea, diaphoresis, orthopnea, PND, claudication or edema Respiratory: denies cough, dyspnea, DOE, pleurisy, hoarseness, laryngitis or wheezing.  Gastrointestinal: Denies dysphagia, heartburn, reflux, water brash, pain, cramps, nausea, vomiting, bloating, diarrhea, constipation, hematemesis, melena, hematochezia, jaundice or hemorrhoids Genitourinary: Denies dysuria, frequency, urgency, nocturia, hesitancy, discharge, hematuria or flank pain Musculoskeletal: Denies arthralgia, myalgia, stiffness, Jt. Swelling, pain, limp or strain/sprain. Denies Falls. Skin: Denies puritis, rash, hives, warts, acne, eczema or change in skin lesion Neuro: No weakness, tremor, incoordination, spasms, paresthesia or pain Psychiatric: Denies confusion, memory loss or sensory loss. Denies Depression. Endocrine: Denies change in weight, skin, hair change, nocturia, and paresthesia, diabetic polys, visual blurring or hyper / hypo glycemic episodes.  Heme/Lymph: No excessive bleeding, bruising or enlarged lymph nodes.  Physical Exam  BP 104/66   Pulse 52  Temp 97.7 F  Resp 16  Ht  Wt 166 lb 6.4 oz   BMI 23.88   General Appearance: Well nourished, in no apparent distress. Eyes: PERRLA, EOMs, conjunctiva no swelling or erythema, normal fundi and vessels. Sinuses: No frontal/maxillary tenderness ENT/Mouth: EACs patent / TMs  nl. Nares clear without erythema, swelling, mucoid exudates. Oral hygiene is good. No erythema, swelling, or exudate. Tongue normal, non-obstructing. Tonsils not swollen or erythematous. Hearing normal.  Neck: Supple, thyroid normal. No bruits, nodes or JVD. Respiratory: Respiratory effort normal.  BS equal and clear bilateral without rales, rhonci, wheezing or stridor. Cardio: Heart sounds are normal with  regular rate and rhythm and no murmurs, rubs or gallops. Peripheral pulses are normal and equal bilaterally without edema. No aortic or femoral bruits. Chest: symmetric with normal excursions and percussion.  Abdomen: Flat, soft, with bowel sounds. Nontender, no guarding, rebound, hernias, masses, or organomegaly.  Lymphatics: Non tender without lymphadenopathy.  Genitourinary: No hernias.Testes nl. DRE - prostate nl for age - smooth & firm w/o nodules. Musculoskeletal: Full ROM all peripheral extremities, joint stability, 5/5 strength, and normal gait. Skin: Warm and dry without rashes, lesions, cyanosis, clubbing or  ecchymosis.  Neuro: Cranial nerves intact, reflexes equal bilaterally. Normal muscle tone, no cerebellar symptoms. Sensation intact.  Pysch: Awake and oriented X 3 with normal affect, insight and judgment appropriate.   Assessment and Plan  1. Essential hypertension  - Microalbumin / creatinine urine ratio - EKG 12-Lead - [place], RETROPERITNL ABD,  LTD  2. Hyperlipidemia  - Lipid panel  3. PreDiabetes  - Hemoglobin A1c - Insulin, random  4. Vitamin D  deficiency  - Vit D  25 hydroxy (rtn osteoporosis monitoring)  5. Idiopathic gout, unspecified chronicity, unspecified site  - Uric acid  6. Testosterone Deficiency  - Testosterone  7. Prostate cancer screening  - PSA  8. Screening for rectal cancer   9. Other fatigue  - Vitamin B12 - Iron and TIBC - TSH  10. Medication management  - Urine Microscopic - CBC with Differential/Platelet - BASIC METABOLIC PANEL WITH GFR - Hepatic function panel - Magnesium  11. Screening examination for pulmonary tuberculosis  - PPD   Continue prudent diet as discussed, weight control, BP monitoring, regular exercise, and medications as discussed.  Discussed med effects and SE's. Routine screening labs and tests as requested with regular follow-up as recommended.  Over 40 minutes of exam, counseling &  chart review  was performed

## 2014-07-24 NOTE — Patient Instructions (Signed)

## 2014-07-25 ENCOUNTER — Encounter: Payer: Self-pay | Admitting: Internal Medicine

## 2014-07-25 LAB — CBC WITH DIFFERENTIAL/PLATELET
BASOS ABS: 0 10*3/uL (ref 0.0–0.1)
Basophils Relative: 0 % (ref 0–1)
EOS ABS: 0.1 10*3/uL (ref 0.0–0.7)
EOS PCT: 2 % (ref 0–5)
HEMATOCRIT: 47.2 % (ref 39.0–52.0)
Hemoglobin: 15.4 g/dL (ref 13.0–17.0)
LYMPHS PCT: 24 % (ref 12–46)
Lymphs Abs: 1.4 10*3/uL (ref 0.7–4.0)
MCH: 18.9 pg — AB (ref 26.0–34.0)
MCHC: 32.6 g/dL (ref 30.0–36.0)
MCV: 58 fL — ABNORMAL LOW (ref 78.0–100.0)
Monocytes Absolute: 0.3 10*3/uL (ref 0.1–1.0)
Monocytes Relative: 5 % (ref 3–12)
Neutro Abs: 4.1 10*3/uL (ref 1.7–7.7)
Neutrophils Relative %: 69 % (ref 43–77)
PLATELETS: 176 10*3/uL (ref 150–400)
RBC: 8.14 MIL/uL — ABNORMAL HIGH (ref 4.22–5.81)
RDW: 18.9 % — ABNORMAL HIGH (ref 11.5–15.5)
WBC: 6 10*3/uL (ref 4.0–10.5)

## 2014-07-25 LAB — PSA: PSA: 0.42 ng/mL (ref <=4.00)

## 2014-07-25 LAB — HEMOGLOBIN A1C
HEMOGLOBIN A1C: 5.7 % — AB (ref <5.7)
MEAN PLASMA GLUCOSE: 117 mg/dL — AB (ref <117)

## 2014-07-25 LAB — URINALYSIS, MICROSCOPIC ONLY
BACTERIA UA: NONE SEEN
CASTS: NONE SEEN
CRYSTALS: NONE SEEN
Squamous Epithelial / LPF: NONE SEEN

## 2014-07-25 LAB — MICROALBUMIN / CREATININE URINE RATIO
CREATININE, URINE: 223.7 mg/dL
MICROALB UR: 4.5 mg/dL — AB (ref <2.0)
MICROALB/CREAT RATIO: 20.1 mg/g (ref 0.0–30.0)

## 2014-07-25 LAB — VITAMIN D 25 HYDROXY (VIT D DEFICIENCY, FRACTURES): VIT D 25 HYDROXY: 55 ng/mL (ref 30–100)

## 2014-07-25 LAB — INSULIN, RANDOM: Insulin: 2.2 u[IU]/mL (ref 2.0–19.6)

## 2014-07-25 LAB — TESTOSTERONE: TESTOSTERONE: 1595.27 ng/dL — AB (ref 300–890)

## 2014-07-25 MED ORDER — "NEEDLE (DISP) 21G X 1"" MISC": Status: DC

## 2014-07-29 ENCOUNTER — Other Ambulatory Visit: Payer: Self-pay | Admitting: Internal Medicine

## 2014-08-01 ENCOUNTER — Encounter: Payer: Self-pay | Admitting: Internal Medicine

## 2014-08-01 ENCOUNTER — Other Ambulatory Visit: Payer: Self-pay | Admitting: Internal Medicine

## 2014-08-01 MED ORDER — ATENOLOL 100 MG PO TABS: 100.0000 mg | ORAL_TABLET | Freq: Every day | ORAL | Status: DC

## 2014-08-04 ENCOUNTER — Other Ambulatory Visit: Payer: Self-pay | Admitting: *Deleted

## 2014-08-04 MED ORDER — ATENOLOL 100 MG PO TABS: 100.0000 mg | ORAL_TABLET | Freq: Every day | ORAL | Status: DC

## 2014-11-05 ENCOUNTER — Ambulatory Visit (INDEPENDENT_AMBULATORY_CARE_PROVIDER_SITE_OTHER): Payer: Managed Care, Other (non HMO) | Admitting: Internal Medicine

## 2014-11-05 ENCOUNTER — Encounter: Payer: Self-pay | Admitting: Internal Medicine

## 2014-11-05 VITALS — BP 152/94 | HR 80 | Temp 98.2°F | Resp 16 | Ht 70.0 in | Wt 173.0 lb

## 2014-11-05 DIAGNOSIS — M79632 Pain in left forearm: Secondary | ICD-10-CM

## 2014-11-05 MED ORDER — PREDNISONE 20 MG PO TABS: ORAL_TABLET | ORAL | Status: DC

## 2014-11-05 NOTE — Progress Notes (Signed)
   Subjective:    Patient ID: Kevin Gomez, male    DOB: February 04, 1956, 59 y.o.   MRN: [id number]  Arm Pain    Patient presents to the office for evaluation of left arm pain which has been bothering him for approximately 1 week.  Patient reports that the pain is intermittent and happens only with elbow flexion.  He reports no issues with twisting.  He did try taking time off of work to allow it to rest, but that didn't help much.  He has no injury he can think of.  He reports not trying relieving factors.  He does not have history of left arm issues.  He is left handed.      Review of Systems  Constitutional: Negative for fever and chills.  Gastrointestinal: Negative for nausea and vomiting.  Musculoskeletal: Positive for myalgias. Negative for joint swelling and arthralgias.  Skin: Negative for color change.       Objective:   Physical Exam  Constitutional: He appears well-developed and well-nourished. No distress.  HENT:  Head: Normocephalic.  Mouth/Throat: Oropharynx is clear and moist. No oropharyngeal exudate.  Eyes: Conjunctivae are normal.  Neck: Normal range of motion. Neck supple. No JVD present. No thyromegaly present.  Cardiovascular: Normal rate, regular rhythm, normal heart sounds and intact distal pulses.  Exam reveals no gallop and no friction rub.   No murmur heard. Pulmonary/Chest: Effort normal and breath sounds normal. No respiratory distress. He has no wheezes. He has no rales. He exhibits no tenderness.  Abdominal: Soft. Bowel sounds are normal.  Musculoskeletal:       Left elbow: Normal.       Left wrist: Normal.       Left forearm: He exhibits tenderness. He exhibits no bony tenderness, no swelling, no edema, no deformity and no laceration.  Pain with elbow flexion and wrist flexion.  Lymphadenopathy:    He has no cervical adenopathy.  Skin: He is not diaphoretic.  Nursing note and vitals reviewed.   Filed Vitals:   11/05/14 1552  BP: 152/94  Pulse:  80  Temp: 98.2 F (36.8 C)  Resp: 16          Assessment & Plan:    1. Left forearm pain -left brachioradialis strain -RICE -heat Gentle stretching -prednisone

## 2014-11-05 NOTE — Patient Instructions (Signed)

## 2014-11-19 ENCOUNTER — Encounter: Payer: Self-pay | Admitting: Internal Medicine

## 2014-11-19 ENCOUNTER — Ambulatory Visit (INDEPENDENT_AMBULATORY_CARE_PROVIDER_SITE_OTHER): Payer: Managed Care, Other (non HMO) | Admitting: Internal Medicine

## 2014-11-19 VITALS — BP 128/80 | HR 64 | Temp 98.2°F | Resp 16 | Ht 70.0 in | Wt 178.0 lb

## 2014-11-19 DIAGNOSIS — M7712 Lateral epicondylitis, left elbow: Secondary | ICD-10-CM

## 2014-11-19 MED ORDER — TRAMADOL HCL 50 MG PO TABS: 50.0000 mg | ORAL_TABLET | Freq: Four times a day (QID) | ORAL | Status: DC | PRN

## 2014-11-19 NOTE — Patient Instructions (Signed)
Tramadol tablets What is this medicine? TRAMADOL (TRA ma dole) is a pain reliever. It is used to treat moderate to severe pain in adults. This medicine may be used for other purposes; ask your health care provider or pharmacist if you have questions. COMMON BRAND NAME(S): Ultram What should I tell my health care provider before I take this medicine? They need to know if you have any of these conditions: -brain tumor -depression -drug abuse or addiction -head injury -if you frequently drink alcohol containing drinks -kidney disease or trouble passing urine -liver disease -lung disease, asthma, or breathing problems -seizures or epilepsy -suicidal thoughts, plans, or attempt; a previous suicide attempt by you or a family member -an unusual or allergic reaction to tramadol, codeine, other medicines, foods, dyes, or preservatives -pregnant or trying to get pregnant -breast-feeding How should I use this medicine? Take this medicine by mouth with a full glass of water. Follow the directions on the prescription label. If the medicine upsets your stomach, take it with food or milk. Do not take more medicine than you are told to take. Talk to your pediatrician regarding the use of this medicine in children. Special care may be needed. Overdosage: If you think you have taken too much of this medicine contact a poison control center or emergency room at once. NOTE: This medicine is only for you. Do not share this medicine with others. What if I miss a dose? If you miss a dose, take it as soon as you can. If it is almost time for your next dose, take only that dose. Do not take double or extra doses. What may interact with this medicine? Do not take this medicine with any of the following medications: -MAOIs like Carbex, Eldepryl, Marplan, Nardil, and Parnate This medicine may also interact with the following medications: -alcohol or medicines that contain  alcohol -antihistamines -benzodiazepines -bupropion -carbamazepine or oxcarbazepine -clozapine -cyclobenzaprine -digoxin -furazolidone -linezolid -medicines for depression, anxiety, or psychotic disturbances -medicines for migraine headache like almotriptan, eletriptan, frovatriptan, naratriptan, rizatriptan, sumatriptan, zolmitriptan -medicines for pain like pentazocine, buprenorphine, butorphanol, meperidine, nalbuphine, and propoxyphene -medicines for sleep -muscle relaxants -naltrexone -phenobarbital -phenothiazines like perphenazine, thioridazine, chlorpromazine, mesoridazine, fluphenazine, prochlorperazine, promazine, and trifluoperazine -procarbazine -warfarin This list may not describe all possible interactions. Give your health care provider a list of all the medicines, herbs, non-prescription drugs, or dietary supplements you use. Also tell them if you smoke, drink alcohol, or use illegal drugs. Some items may interact with your medicine. What should I watch for while using this medicine? Tell your doctor or health care professional if your pain does not go away, if it gets worse, or if you have new or a different type of pain. You may develop tolerance to the medicine. Tolerance means that you will need a higher dose of the medicine for pain relief. Tolerance is normal and is expected if you take this medicine for a long time. Do not suddenly stop taking your medicine because you may develop a severe reaction. Your body becomes used to the medicine. This does NOT mean you are addicted. Addiction is a behavior related to getting and using a drug for a non-medical reason. If you have pain, you have a medical reason to take pain medicine. Your doctor will tell you how much medicine to take. If your doctor wants you to stop the medicine, the dose will be slowly lowered over time to avoid any side effects. You may get drowsy or dizzy. Do not drive, use  machinery, or do anything that  needs mental alertness until you know how this medicine affects you. Do not stand or sit up quickly, especially if you are an older patient. This reduces the risk of dizzy or fainting spells. Alcohol can increase or decrease the effects of this medicine. Avoid alcoholic drinks. You may have constipation. Try to have a bowel movement at least every 2 to 3 days. If you do not have a bowel movement for 3 days, call your doctor or health care professional. Your mouth may get dry. Chewing sugarless gum or sucking hard candy, and drinking plenty of water may help. Contact your doctor if the problem does not go away or is severe. What side effects may I notice from receiving this medicine? Side effects that you should report to your doctor or health care professional as soon as possible: -allergic reactions like skin rash, itching or hives, swelling of the face, lips, or tongue -breathing difficulties, wheezing -confusion -itching -light headedness or fainting spells -redness, blistering, peeling or loosening of the skin, including inside the mouth -seizures Side effects that usually do not require medical attention (report to your doctor or health care professional if they continue or are bothersome): -constipation -dizziness -drowsiness -headache -nausea, vomiting This list may not describe all possible side effects. Call your doctor for medical advice about side effects. You may report side effects to FDA at 1-800-FDA-1088. Where should I keep my medicine? Keep out of the reach of children. Store at room temperature between 15 and 30 degrees C (59 and 86 degrees F). Keep container tightly closed. Throw away any unused medicine after the expiration date. NOTE: This sheet is a summary. It may not cover all possible information. If you have questions about this medicine, talk to your doctor, pharmacist, or health care provider.  2015, [employer]. (2009-11-03 11:55:44)  Lateral  Epicondylitis (Tennis Elbow) with Rehab Lateral epicondylitis involves inflammation and pain around the outer portion of the elbow. The pain is caused by inflammation of the tendons in the forearm that bring back (extend) the wrist. Lateral epicondylitis is also called tennis elbow, because it is very common in tennis players. However, it may occur in any individual who extends the wrist repetitively. If lateral epicondylitis is left untreated, it may become a chronic problem. SYMPTOMS   Pain, tenderness, and inflammation on the outer (lateral) side of the elbow.  Pain or weakness with gripping activities.  Pain that increases with wrist-twisting motions (playing tennis, using a screwdriver, opening a door or a jar).  Pain with lifting objects, including a coffee cup. CAUSES  Lateral epicondylitis is caused by inflammation of the tendons that extend the wrist. Causes of injury may include:  Repetitive stress and strain on the muscles and tendons that extend the wrist.  Sudden change in activity level or intensity.  Incorrect grip in racquet sports.  Incorrect grip size of racquet (often too large).  Incorrect hitting position or technique (usually backhand, leading with the elbow).  Using a racket that is too heavy. RISK INCREASES WITH:  Sports or occupations that require repetitive and/or strenuous forearm and wrist movements (tennis, squash, racquetball, carpentry).  Poor wrist and forearm strength and flexibility.  Failure to warm up properly before activity.  Resuming activity before healing, rehabilitation, and conditioning are complete. PREVENTION   Warm up and stretch properly before activity.  Maintain physical fitness:  Strength, flexibility, and endurance.  Cardiovascular fitness.  Wear and use properly fitted equipment.  Learn and use  proper technique and have a coach correct improper technique.  Wear a tennis elbow (counterforce) brace. PROGNOSIS  The  course of this condition depends on the degree of the injury. If treated properly, acute cases (symptoms lasting less than 4 weeks) are often resolved in 2 to 6 weeks. Chronic (longer lasting cases) often resolve in 3 to 6 months but may require physical therapy. RELATED COMPLICATIONS   Frequently recurring symptoms, resulting in a chronic problem. Properly treating the problem the first time decreases frequency of recurrence.  Chronic inflammation, scarring tendon degeneration, and partial tendon tear, requiring surgery.  Delayed healing or resolution of symptoms. TREATMENT  Treatment first involves the use of ice and medicine to reduce pain and inflammation. Strengthening and stretching exercises may help reduce discomfort if performed regularly. These exercises may be performed at home if the condition is an acute injury. Chronic cases may require a referral to a physical therapist for evaluation and treatment. Your caregiver may advise a corticosteroid injection to help reduce inflammation. Rarely, surgery is needed. MEDICATION  If pain medicine is needed, nonsteroidal anti-inflammatory medicines (aspirin and ibuprofen), or other minor pain relievers (acetaminophen), are often advised.  Do not take pain medicine for 7 days before surgery.  Prescription pain relievers may be given, if your caregiver thinks they are needed. Use only as directed and only as much as you need.  Corticosteroid injections may be recommended. These injections should be reserved only for the most severe cases, because they can only be given a certain number of times. HEAT AND COLD  Cold treatment (icing) should be applied for 10 to 15 minutes every 2 to 3 hours for inflammation and pain, and immediately after activity that aggravates your symptoms. Use ice packs or an ice massage.  Heat treatment may be used before performing stretching and strengthening activities prescribed by your caregiver, physical therapist,  or athletic trainer. Use a heat pack or a warm water soak. SEEK MEDICAL CARE IF: Symptoms get worse or do not improve in 2 weeks, despite treatment. EXERCISES  RANGE OF MOTION (ROM) AND STRETCHING EXERCISES - Epicondylitis, Lateral (Tennis Elbow) These exercises may help you when beginning to rehabilitate your injury. Your symptoms may go away with or without further involvement from your physician, physical therapist, or athletic trainer. While completing these exercises, remember:   Restoring tissue flexibility helps normal motion to return to the joints. This allows healthier, less painful movement and activity.  An effective stretch should be held for at least 30 seconds.  A stretch should never be painful. You should only feel a gentle lengthening or release in the stretched tissue. RANGE OF MOTION - Wrist Flexion, Active-Assisted  Extend your right / left elbow with your fingers pointing down.*  Gently pull the back of your hand towards you, until you feel a gentle stretch on the top of your forearm.  Hold this position for __________ seconds. Repeat __________ times. Complete this exercise __________ times per day.  *If directed by your physician, physical therapist or athletic trainer, complete this stretch with your elbow bent, rather than extended. RANGE OF MOTION - Wrist Extension, Active-Assisted  Extend your right / left elbow and turn your palm upwards.*  Gently pull your palm and fingertips back, so your wrist extends and your fingers point more toward the ground.  You should feel a gentle stretch on the inside of your forearm.  Hold this position for __________ seconds. Repeat __________ times. Complete this exercise __________ times per day. *If  directed by your physician, physical therapist or athletic trainer, complete this stretch with your elbow bent, rather than extended. STRETCH - Wrist Flexion  Place the back of your right / left hand on a tabletop, leaving  your elbow slightly bent. Your fingers should point away from your body.  Gently press the back of your hand down onto the table by straightening your elbow. You should feel a stretch on the top of your forearm.  Hold this position for __________ seconds. Repeat __________ times. Complete this stretch __________ times per day.  STRETCH - Wrist Extension   Place your right / left fingertips on a tabletop, leaving your elbow slightly bent. Your fingers should point backwards.  Gently press your fingers and palm down onto the table by straightening your elbow. You should feel a stretch on the inside of your forearm.  Hold this position for __________ seconds. Repeat __________ times. Complete this stretch __________ times per day.  STRENGTHENING EXERCISES - Epicondylitis, Lateral (Tennis Elbow) These exercises may help you when beginning to rehabilitate your injury. They may resolve your symptoms with or without further involvement from your physician, physical therapist, or athletic trainer. While completing these exercises, remember:   Muscles can gain both the endurance and the strength needed for everyday activities through controlled exercises.  Complete these exercises as instructed by your physician, physical therapist or athletic trainer. Increase the resistance and repetitions only as guided.  You may experience muscle soreness or fatigue, but the pain or discomfort you are trying to eliminate should never worsen during these exercises. If this pain does get worse, stop and make sure you are following the directions exactly. If the pain is still present after adjustments, discontinue the exercise until you can discuss the trouble with your caregiver. STRENGTH - Wrist Flexors  Sit with your right / left forearm palm-up and fully supported on a table or countertop. Your elbow should be resting below the height of your shoulder. Allow your wrist to extend over the edge of the  surface.  Loosely holding a __________ weight, or a piece of rubber exercise band or tubing, slowly curl your hand up toward your forearm.  Hold this position for __________ seconds. Slowly lower the wrist back to the starting position in a controlled manner. Repeat __________ times. Complete this exercise __________ times per day.  STRENGTH - Wrist Extensors  Sit with your right / left forearm palm-down and fully supported on a table or countertop. Your elbow should be resting below the height of your shoulder. Allow your wrist to extend over the edge of the surface.  Loosely holding a __________ weight, or a piece of rubber exercise band or tubing, slowly curl your hand up toward your forearm.  Hold this position for __________ seconds. Slowly lower the wrist back to the starting position in a controlled manner. Repeat __________ times. Complete this exercise __________ times per day.  STRENGTH - Ulnar Deviators  Stand with a ____________________ weight in your right / left hand, or sit while holding a rubber exercise band or tubing, with your healthy arm supported on a table or countertop.  Move your wrist, so that your pinkie travels toward your forearm and your thumb moves away from your forearm.  Hold this position for __________ seconds and then slowly lower the wrist back to the starting position. Repeat __________ times. Complete this exercise __________ times per day STRENGTH - Radial Deviators  Stand with a ____________________ weight in your right / left hand,  or sit while holding a rubber exercise band or tubing, with your injured arm supported on a table or countertop.  Raise your hand upward in front of you or pull up on the rubber tubing.  Hold this position for __________ seconds and then slowly lower the wrist back to the starting position. Repeat __________ times. Complete this exercise __________ times per day. STRENGTH - Forearm Supinators   Sit with your right /  left forearm supported on a table, keeping your elbow below shoulder height. Rest your hand over the edge, palm down.  Gently grip a hammer or a soup ladle.  Without moving your elbow, slowly turn your palm and hand upward to a "thumbs-up" position.  Hold this position for __________ seconds. Slowly return to the starting position. Repeat __________ times. Complete this exercise __________ times per day.  STRENGTH - Forearm Pronators   Sit with your right / left forearm supported on a table, keeping your elbow below shoulder height. Rest your hand over the edge, palm up.  Gently grip a hammer or a soup ladle.  Without moving your elbow, slowly turn your palm and hand upward to a "thumbs-up" position.  Hold this position for __________ seconds. Slowly return to the starting position. Repeat __________ times. Complete this exercise __________ times per day.  STRENGTH - Grip  Grasp a tennis ball, a dense sponge, or a large, rolled sock in your hand.  Squeeze as hard as you can, without increasing any pain.  Hold this position for __________ seconds. Release your grip slowly. Repeat __________ times. Complete this exercise __________ times per day.  STRENGTH - Elbow Extensors, Isometric  Stand or sit upright, on a firm surface. Place your right / left arm so that your palm faces your stomach, and it is at the height of your waist.  Place your opposite hand on the underside of your forearm. Gently push up as your right / left arm resists. Push as hard as you can with both arms, without causing any pain or movement at your right / left elbow. Hold this stationary position for __________ seconds. Gradually release the tension in both arms. Allow your muscles to relax completely before repeating. Document Released: 02/20/2005 Document Revised: 07/07/2013 Document Reviewed: 06/04/2008 [facility] Patient Information 2015 [place], [place]. This information is not intended to replace advice given  to you by your health care provider. Make sure you discuss any questions you have with your health care provider.

## 2014-11-19 NOTE — Progress Notes (Signed)
   Subjective:    Patient ID: Kevin Gomez, male    DOB: Nov 13, 1955, 59 y.o.   MRN: [id number]  Arm Pain  Pertinent negatives include no numbness.  Patient presents to the office for evaluation of left arm pain x 1 month.  He reports that his arm pain from the previous visit went away after the prednisone use.  He now reports arm tenderness and also some pain up near the elbow.  He reports that he has no injury.  He is taking mobic and gabapentin 800 mg.  He has also taken tylenol with it and that doesn't help much.  He reports that he thinks that this is due to the repetitive motion of his job.  He reports that he can barely use his dominant arm.  He has never seen ortho.      Review of Systems  Constitutional: Negative for fever, chills and fatigue.  Gastrointestinal: Negative for nausea and vomiting.  Musculoskeletal: Positive for myalgias and arthralgias. Negative for joint swelling.  Neurological: Negative for weakness and numbness.       Objective:   Physical Exam  Constitutional: He appears well-developed and well-nourished. No distress.  HENT:  Head: Normocephalic.  Mouth/Throat: Oropharynx is clear and moist. No oropharyngeal exudate.  Eyes: Conjunctivae are normal. No scleral icterus.  Neck: Normal range of motion. Neck supple. No JVD present. No thyromegaly present.  Cardiovascular: Normal rate, regular rhythm, normal heart sounds and intact distal pulses.   Pulmonary/Chest: Effort normal and breath sounds normal. No respiratory distress. He has no wheezes. He has no rales. He exhibits no tenderness.  Abdominal: Soft. Bowel sounds are normal. He exhibits no distension and no mass. There is no tenderness. There is no rebound and no guarding.  Musculoskeletal:       Left elbow: He exhibits normal range of motion, no swelling, no effusion, no deformity and no laceration. Tenderness found. Lateral epicondyle tenderness noted. No radial head, no medial epicondyle and no olecranon  process tenderness noted.       Left hand: He exhibits normal range of motion, no tenderness, no bony tenderness, normal two-point discrimination, normal capillary refill, no deformity, no laceration and no swelling. Normal sensation noted. Normal strength noted.  Pain with extension of the wrist, elbow flexion, and pronation and suppination with resistance.  Lymphadenopathy:    He has no cervical adenopathy.  Skin: He is not diaphoretic.  Nursing note and vitals reviewed.   Filed Vitals:   11/19/14 1438  BP: 128/80  Pulse: 64  Temp: 98.2 F (36.8 C)  Resp: 16         Assessment & Plan:    1. Left lateral epicondylitis -tramadol prn - Ambulatory referral to Orthopedic Surgery

## 2014-11-28 ENCOUNTER — Other Ambulatory Visit: Payer: Self-pay | Admitting: Internal Medicine

## 2015-01-02 ENCOUNTER — Other Ambulatory Visit: Payer: Self-pay | Admitting: Internal Medicine

## 2015-01-04 ENCOUNTER — Other Ambulatory Visit: Payer: Self-pay | Admitting: Internal Medicine

## 2015-01-05 ENCOUNTER — Other Ambulatory Visit: Payer: Self-pay | Admitting: Internal Medicine

## 2015-01-12 ENCOUNTER — Encounter: Payer: Self-pay | Admitting: Physician Assistant

## 2015-01-12 ENCOUNTER — Ambulatory Visit (INDEPENDENT_AMBULATORY_CARE_PROVIDER_SITE_OTHER): Payer: Managed Care, Other (non HMO) | Admitting: Physician Assistant

## 2015-01-12 VITALS — BP 120/80 | HR 60 | Temp 98.1°F | Resp 16 | Ht 70.0 in | Wt 170.0 lb

## 2015-01-12 DIAGNOSIS — J209 Acute bronchitis, unspecified: Secondary | ICD-10-CM

## 2015-01-12 MED ORDER — AZITHROMYCIN 250 MG PO TABS: ORAL_TABLET | ORAL | Status: AC

## 2015-01-12 MED ORDER — ALBUTEROL SULFATE HFA 108 (90 BASE) MCG/ACT IN AERS: 2.0000 | INHALATION_SPRAY | Freq: Four times a day (QID) | RESPIRATORY_TRACT | Status: DC | PRN

## 2015-01-12 MED ORDER — PROMETHAZINE-CODEINE 6.25-10 MG/5ML PO SYRP: 5.0000 mL | ORAL_SOLUTION | Freq: Four times a day (QID) | ORAL | Status: DC | PRN

## 2015-01-12 MED ORDER — PREDNISONE 20 MG PO TABS: ORAL_TABLET | ORAL | Status: DC

## 2015-01-12 NOTE — Progress Notes (Signed)
   Subjective:    Patient ID: Kevin BickersMichael Gomez, male    DOB: [birth year], 59 y.o.   MRN: [id number]  HPI 59 y.o. non smoking male presents with cough x Sunday. Has had fever, chills, cough with mucus, getting worse. Has done ibuprofen and mucinex.   Blood pressure 120/80, pulse 60, temperature 98.1 F (36.7 C), temperature source Temporal, resp. rate 16, height 5\' 10"  (1.778 m), weight 170 lb (77.111 kg), SpO2 97 %.   Review of Systems  Constitutional: Negative for fever, chills and diaphoresis.  HENT: Positive for congestion, postnasal drip, rhinorrhea, sinus pressure and sore throat. Negative for ear pain, sneezing, trouble swallowing and voice change.   Eyes: Negative.   Respiratory: Positive for cough, shortness of breath and wheezing. Negative for chest tightness.   Cardiovascular: Negative.   Gastrointestinal: Negative.   Genitourinary: Negative.   Musculoskeletal: Negative.  Negative for neck pain.  Neurological: Negative.  Negative for headaches.       Objective:   Physical Exam  Constitutional: He is oriented to person, place, and time. He appears well-developed and well-nourished.  HENT:  Head: Normocephalic and atraumatic.  Right Ear: External ear normal.  Left Ear: External ear normal.  Nose: Right sinus exhibits maxillary sinus tenderness. Left sinus exhibits maxillary sinus tenderness.  Mouth/Throat: Oropharynx is clear and moist.  Eyes: Conjunctivae are normal. Pupils are equal, round, and reactive to light.  Neck: Normal range of motion. Neck supple.  Cardiovascular: Normal rate, regular rhythm and normal heart sounds.   No murmur heard. Pulmonary/Chest: Effort normal. No respiratory distress. He has wheezes. He has no rales. He exhibits no tenderness.  Abdominal: Soft. Bowel sounds are normal.  Lymphadenopathy:    He has no cervical adenopathy.  Neurological: He is alert and oriented to person, place, and time.  Skin: Skin is warm and dry.       Assessment &  Plan:  1. Acute bronchitis, unspecified organism Mucinex, take medications, if not better call Monday and will get CXR - predniSONE (DELTASONE) 20 MG tablet; 2 tablets daily for 3 days, 1 tablet daily for 4 days.  Dispense: 10 tablet; Refill: 0 - azithromycin (ZITHROMAX) 250 MG tablet; Take 2 tablets (500 mg) on  Day 1,  followed by 1 tablet (250 mg) once daily on Days 2 through 5.  Dispense: 6 each; Refill: 1 - albuterol (PROVENTIL HFA;VENTOLIN HFA) 108 (90 BASE) MCG/ACT inhaler; Inhale 2 puffs into the lungs every 6 (six) hours as needed for wheezing or shortness of breath.  Dispense: 1 Inhaler; Refill: 2 - promethazine-codeine (PHENERGAN WITH CODEINE) 6.25-10 MG/5ML syrup; Take 5 mLs by mouth every 6 (six) hours as needed for cough. Max: 20mL per day  Dispense: 240 mL; Refill: 0

## 2015-01-12 NOTE — Patient Instructions (Signed)
I will give you a prescription for an antibiotic, but please only take it if you are not feeling better in 7-10 days.  Bronchitis is mostly caused by viruses and the antibiotic will do nothing.  PLEASE TRY TO DO OVER THE COUNTER TREATMENT AND/OR PREDNISONE FOR 5-7 DAYS AND IF YOU ARE NOT GETTING BETTER OR GETTING WORSE THEN YOU CAN START ON AN ANTIBIOTIC GIVEN.  Can take the prednisone AT NIGHT WITH DINNER, it take 8-12 hours to start working so it will NOT affect your sleeping if you take it at night with your food!! Take two pills the first night and 1 or two pill the second night and then 1 pill the other nights.    Rest and stay hydrated.  Make sure you drink plenty of fluids to make sure urine is clear when you urinate.  Water will help thin out mucous. - Take Mucinex DM- Maximum Strength over the counter to thin out and cough up the thick mucous.  Please follow directions on box. -Take Albuterol if prescribed.  Risk of antibiotic use: About 1 in 4 people who take antibiotics have side effects including stomach problems, dizziness, or rashes. Those problems clear up soon after stopping the drugs, but in rare cases antibiotics can cause severe allergic reaction. Over use of antibiotics also encourages the growth of bacteria that can't be controlled easily with drugs. That makes you more vunerable to antibiotic-resistant infections and undermines the benefits of antibiotics for others.   Waste of Money: Antibiotics often aren't very expensive, but any money spent on unnecessary drugs is money down the drain.   When are antibiotics needed? Only when symptoms last longer than a week.  Start to improve but then worsen again  Please call the office or message through My Chart if you have any questions.   Acute Bronchitis Bronchitis is when the airways that extend from the windpipe into the lungs get red, puffy, and painful (inflamed). Bronchitis often causes thick spit (mucus) to develop. This  leads to a cough. A cough is the most common symptom of bronchitis. In acute bronchitis, the condition usually begins suddenly and goes away over time (usually in 2 weeks). Smoking, allergies, and asthma can make bronchitis worse. Repeated episodes of bronchitis may cause more lung problems.  Most common cause of Bronchitis is viruses (rhinovirus, coronavirus, RSV).  Therefore, not requiring an antibiotic; as antibiotics only treat bacterial infections.  HOME CARE  Rest.  Drink enough fluids to keep your pee (urine) clear or pale yellow (unless you need to limit fluids as told by your doctor).  Only take over-the-counter or prescription medicines as told by your doctor.  Avoid smoking and secondhand smoke. These can make bronchitis worse. If you are a smoker, think about using nicotine gum or skin patches. Quitting smoking will help your lungs heal faster.  Reduce the chance of getting bronchitis again by:  Washing your hands often.  Avoiding people with cold symptoms.  Trying not to touch your hands to your mouth, nose, or eyes.  Follow up with your doctor as told. GET HELP IF: Your symptoms do not improve after 1 week of treatment. Symptoms include:  Cough.  Fever.  Coughing up thick spit.  Body aches.  Chest congestion.  Chills.  Shortness of breath.  Sore throat. GET HELP RIGHT AWAY IF:   You have an increased fever.  You have chills.  You have severe shortness of breath.  You have bloody thick spit (sputum).    You throw up (vomit) often.  You lose too much body fluid (dehydration).  You have a severe headache.  You faint. MAKE SURE YOU:   Understand these instructions.  Will watch your condition.  Will get help right away if you are not doing well or get worse. Document Released: 08/09/2007 Document Revised: 10/23/2012 Document Reviewed: 08/13/2012 Exit[employer] Patient Information 2015 [employer]. This information is not intended to replace  advice given to you by your health care provider. Make sure you discuss any questions you have with your health care provider.   

## 2015-01-26 ENCOUNTER — Ambulatory Visit (INDEPENDENT_AMBULATORY_CARE_PROVIDER_SITE_OTHER): Payer: Managed Care, Other (non HMO) | Admitting: Internal Medicine

## 2015-01-26 ENCOUNTER — Encounter: Payer: Self-pay | Admitting: Internal Medicine

## 2015-01-26 VITALS — BP 116/72 | HR 60 | Temp 97.5°F | Resp 16 | Ht 70.0 in | Wt 170.2 lb

## 2015-01-26 DIAGNOSIS — I10 Essential (primary) hypertension: Secondary | ICD-10-CM

## 2015-01-26 DIAGNOSIS — M1000 Idiopathic gout, unspecified site: Secondary | ICD-10-CM | POA: Diagnosis not present

## 2015-01-26 DIAGNOSIS — Z6824 Body mass index (BMI) 24.0-24.9, adult: Secondary | ICD-10-CM

## 2015-01-26 DIAGNOSIS — E559 Vitamin D deficiency, unspecified: Secondary | ICD-10-CM

## 2015-01-26 DIAGNOSIS — F909 Attention-deficit hyperactivity disorder, unspecified type: Secondary | ICD-10-CM | POA: Diagnosis not present

## 2015-01-26 DIAGNOSIS — E291 Testicular hypofunction: Secondary | ICD-10-CM | POA: Diagnosis not present

## 2015-01-26 DIAGNOSIS — R7309 Other abnormal glucose: Secondary | ICD-10-CM

## 2015-01-26 DIAGNOSIS — Z79899 Other long term (current) drug therapy: Secondary | ICD-10-CM | POA: Diagnosis not present

## 2015-01-26 DIAGNOSIS — J209 Acute bronchitis, unspecified: Secondary | ICD-10-CM

## 2015-01-26 DIAGNOSIS — F988 Other specified behavioral and emotional disorders with onset usually occurring in childhood and adolescence: Secondary | ICD-10-CM

## 2015-01-26 DIAGNOSIS — E782 Mixed hyperlipidemia: Secondary | ICD-10-CM

## 2015-01-26 DIAGNOSIS — Z6825 Body mass index (BMI) 25.0-25.9, adult: Secondary | ICD-10-CM | POA: Insufficient documentation

## 2015-01-26 HISTORY — DX: Body mass index (BMI) 25.0-25.9, adult: Z68.25

## 2015-01-26 LAB — CBC WITH DIFFERENTIAL/PLATELET
BASOS PCT: 0 % (ref 0–1)
Basophils Absolute: 0 10*3/uL (ref 0.0–0.1)
EOS ABS: 0.1 10*3/uL (ref 0.0–0.7)
EOS PCT: 1 % (ref 0–5)
HCT: 40.1 % (ref 39.0–52.0)
Hemoglobin: 12.5 g/dL — ABNORMAL LOW (ref 13.0–17.0)
Lymphocytes Relative: 13 % (ref 12–46)
Lymphs Abs: 1.6 10*3/uL (ref 0.7–4.0)
MCH: 17.2 pg — AB (ref 26.0–34.0)
MCHC: 31.2 g/dL (ref 30.0–36.0)
MCV: 55.3 fL — ABNORMAL LOW (ref 78.0–100.0)
MONO ABS: 0.5 10*3/uL (ref 0.1–1.0)
MONOS PCT: 4 % (ref 3–12)
Neutro Abs: 10 10*3/uL — ABNORMAL HIGH (ref 1.7–7.7)
Neutrophils Relative %: 82 % — ABNORMAL HIGH (ref 43–77)
PLATELETS: 190 10*3/uL (ref 150–400)
RBC: 7.25 MIL/uL — ABNORMAL HIGH (ref 4.22–5.81)
RDW: 20.3 % — ABNORMAL HIGH (ref 11.5–15.5)
WBC: 12.2 10*3/uL — ABNORMAL HIGH (ref 4.0–10.5)

## 2015-01-26 MED ORDER — PREDNISONE 20 MG PO TABS: ORAL_TABLET | ORAL | Status: DC

## 2015-01-26 MED ORDER — LEVOFLOXACIN 500 MG PO TABS
ORAL_TABLET | ORAL | Status: DC
Start: 2015-01-26 — End: 2015-05-03

## 2015-01-26 MED ORDER — "NEEDLE (DISP) 21G X 1"" MISC": Status: DC

## 2015-01-26 NOTE — Patient Instructions (Signed)

## 2015-01-26 NOTE — Progress Notes (Signed)
Patient ID: Kevin Gomez, male   DOB: 01-25-1956, 59 y.o.   MRN: [id number]   This very nice 59 y.o. [name]  presents for  follow up with Hypertension, Hyperlipidemia, Pre-Diabetes and Vitamin D Deficiency.    Patient is treated for HTN SINCE 1998 & BP has been controlled at home. Today's BP: 116/72 mmHg. Patient has had no complaints of any cardiac type chest pain, palpitations, dyspnea/orthopnea/PND, dizziness, claudication, or dependent edema.   Hyperlipidemia is controlled with diet & meds. Patient denies myalgias or other med SE's. Last Lipids were at goal with Cholesterol 152; HDL 31*; LDL 92; Triglycerides 143 on 07/24/2014:   Also, the patient has history of PreDiabetes since 2010 with A1c 5.8% and has had no symptoms of reactive hypoglycemia, diabetic polys, paresthesias or visual blurring.  Last A1c was  5.7% on 07/24/2014.    Patient has hx/o Low T & is on Testosterone injections. Patient is followed by Dr Tacey Heap in [facility] for Bipolar Disorder & ADD and by Dr Regino Schultze for Cx DDD & EDSI's.   In add'n patient is on allopurinol for gout. Further, the patient also has history of Vitamin D Deficiency of 31 in 2008 and supplements vitamin D without any suspected side-effects. Last vitamin D was 55 on 07/24/2014.  Medication Sig  . albuterol  HFA inhaler Inhale 2 puffs into the lungs every 6 (six) hours as needed for wheezing or shortness of breath.  . allopurinol  300 MG tablet TAKE 1 TABLET (300 MG TOTAL) BY MOUTH DAILY.  Marland Kitchen atenolol  100 MG tablet Take 1 tablet (100 mg total) by mouth daily.  Marland Kitchen VITAMIN D 2000 UNITS tablet Take 2,000 Units by mouth 3 (three) times daily.   . clonazePAM  1 MG tablet Take 1 mg by mouth 2 (two) times daily as needed. Takes up to 2 tabs daily prn  . enalapril  20 MG tablet TAKE 1 TABLET EVERY DAY FOR BLOOD PRESSURE  . Fluvoxamine  150 MG CP24 TAKE ONE CAPSULE BY MOUTH AT BEDTIME  . gabapentin  800 MG tablet TAKE 6 TABLETS DAILY OR AS DIRECTED FOR PAIN  .  meloxicam  15 MG tablet TAKE 1 TABLET EVERY DAY AS NEEDED FOR PAIN/INFLAMMATION  . RITALIN SR 20 MG ER tablet Take 20 mg by mouth daily. Takes 3 per day  . mirtazapine (REMERON) 45 MG tablet Take 45 mg by mouth at bedtime.  . Omega-3 FISH OIL 1000 MG CAPS Take 1,000 capsules by mouth.  Marland Kitchen REXULTI 2 MG TABS   . DEPO-TESTOSTERONE 200 MG/ML inj INJECT 2 CC IM EVERY 2 WEEKS, AS DIRECTED  . traMADol (ULTRAM) 50 MG tablet Take 1 tablet (50 mg total) by mouth every 6 (six) hours as needed.   Allergies  Allergen Reactions  . Serzone [Nefazodone]     Leg cramps  . Trazamine [Trazodone & Diet Manage Prod] Other (See Comments)    confusion   PMHx:   Past Medical History  Diagnosis Date  . Hypertension   . Depression   . Hyperlipidemia   . Elevated hemoglobin A1c   . Anemia   . ADD (attention deficit disorder)   . Elevated hemoglobin A1c   . Vitamin D deficiency    Immunization History  Administered Date(s) Administered  . PPD Test 07/23/2013, 07/24/2014  . Pneumococcal-Unspecified 03/06/2009  . Td 03/24/2012   Past Surgical History  Procedure Laterality Date  . Fracture surgery    . Orthopedic surgery Left  left foot pinning  . Orthopedic surgery Left     left foot pinning  . Orthopedic surgery Right     right elbow bursectomy  1984  . Orthopedic surgery Right 2002 dr supple    right rotator cuff   FHx:    Reviewed / unchanged  SHx:    Reviewed / unchanged  Systems Review:  Constitutional: Denies fever, chills, wt changes, headaches, insomnia, fatigue, night sweats, change in appetite. Eyes: Denies redness, blurred vision, diplopia, discharge, itchy, watery eyes.  ENT: Denies discharge, congestion, post nasal drip, epistaxis, sore throat, earache, hearing loss, dental pain, tinnitus, vertigo, sinus pain, snoring.  CV: Denies chest pain, palpitations, irregular heartbeat, syncope, dyspnea, diaphoresis, orthopnea, PND, claudication or edema. Respiratory: denies cough,  dyspnea, DOE, pleurisy, hoarseness, laryngitis, wheezing.  Gastrointestinal: Denies dysphagia, odynophagia, heartburn, reflux, water brash, abdominal pain or cramps, nausea, vomiting, bloating, diarrhea, constipation, hematemesis, melena, hematochezia  or hemorrhoids. Genitourinary: Denies dysuria, frequency, urgency, nocturia, hesitancy, discharge, hematuria or flank pain. Musculoskeletal: Denies arthralgias, myalgias, stiffness, jt. swelling, pain, limping or strain/sprain.  Skin: Denies pruritus, rash, hives, warts, acne, eczema or change in skin lesion(s). Neuro: No weakness, tremor, incoordination, spasms, paresthesia or pain. Psychiatric: Denies confusion, memory loss or sensory loss. Endo: Denies change in weight, skin or hair change.  Heme/Lymph: No excessive bleeding, bruising or enlarged lymph nodes.  Physical Exam  BP 116/72 mmHg  Pulse 60  Temp(Src) 97.5 F (36.4 C)  Resp 16  Ht 5\' 10"  (1.778 m)  Wt 170 lb 3.2 oz (77.202 kg)  BMI 24.42 kg/m2  Appears well nourished and in no distress. Eyes: PERRLA, EOMs, conjunctiva no swelling or erythema. Sinuses: No frontal/maxillary tenderness ENT/Mouth: EAC's clear, TM's nl w/o erythema, bulging. Nares clear w/o erythema, swelling, exudates. Oropharynx clear without erythema or exudates. Oral hygiene is good. Tongue normal, non obstructing. Hearing intact.  Neck: Supple. Thyroid nl. Car 2+/2+ without bruits, nodes or JVD. Chest: Respirations nl with BS clear & equal w/o rales, rhonchi, wheezing or stridor.  Cor: Heart sounds normal w/ regular rate and rhythm without sig. murmurs, gallops, clicks, or rubs. Peripheral pulses normal and equal  without edema.  Abdomen: Soft & bowel sounds normal. Non-tender w/o guarding, rebound, hernias, masses, or organomegaly.  Lymphatics: Unremarkable.  Musculoskeletal: Full ROM all peripheral extremities, joint stability, 5/5 strength, and normal gait.  Skin: Warm, dry without exposed rashes, lesions  or ecchymosis apparent.  Neuro: Cranial nerves intact, reflexes equal bilaterally. Sensory-motor testing grossly intact. Tendon reflexes grossly intact.  Pysch: Alert & oriented x 3.  Insight and judgement nl & appropriate. No ideations.  Assessment and Plan:  1. Essential hypertension  - TSH  2. Hyperlipidemia  - Lipid panel - TSH  3. PreDiabetes  - Hemoglobin A1c - Insulin, random  4. Vitamin D deficiency  - VITAMIN D 25 Hydroxy   5. Idiopathic gout  - Uric acid  6. Testosterone Deficiency  - Testosterone  7. ADD (attention deficit disorder)   8. Body mass index (BMI) of 24.0-24.9 in adult   9. Medication management  - CBC with Differential/Platelet - BASIC METABOLIC PANEL WITH GFR - Hepatic function panel - Magnesium   Recommended regular exercise, BP monitoring, weight control, and discussed med and SE's. Recommended labs to assess and monitor clinical status. Further disposition pending results of labs. Over 30 minutes of exam, counseling, chart review was performed

## 2015-01-27 LAB — HEPATIC FUNCTION PANEL
ALT: 30 U/L (ref 9–46)
AST: 40 U/L — ABNORMAL HIGH (ref 10–35)
Albumin: 4 g/dL (ref 3.6–5.1)
Alkaline Phosphatase: 90 U/L (ref 40–115)
Bilirubin, Direct: 0.1 mg/dL
Indirect Bilirubin: 0.6 mg/dL (ref 0.2–1.2)
Total Bilirubin: 0.7 mg/dL (ref 0.2–1.2)
Total Protein: 6.1 g/dL (ref 6.1–8.1)

## 2015-01-27 LAB — LIPID PANEL
Cholesterol: 181 mg/dL (ref 125–200)
HDL: 34 mg/dL — ABNORMAL LOW
LDL Cholesterol: 110 mg/dL
Total CHOL/HDL Ratio: 5.3 ratio — ABNORMAL HIGH
Triglycerides: 187 mg/dL — ABNORMAL HIGH
VLDL: 37 mg/dL — ABNORMAL HIGH

## 2015-01-27 LAB — BASIC METABOLIC PANEL WITH GFR
BUN: 14 mg/dL (ref 7–25)
CALCIUM: 9.5 mg/dL (ref 8.6–10.3)
CO2: 29 mmol/L (ref 20–31)
CREATININE: 0.94 mg/dL (ref 0.70–1.33)
Chloride: 100 mmol/L (ref 98–110)
GFR, Est African American: >89 mL/min (ref >=60)
GFR, Est Non African American: 89 mL/min (ref >=60)
Glucose, Bld: 114 mg/dL — ABNORMAL HIGH (ref 65–99)
Potassium: 4.3 mmol/L (ref 3.5–5.3)
SODIUM: 138 mmol/L (ref 135–146)

## 2015-01-27 LAB — MAGNESIUM: Magnesium: 1.9 mg/dL (ref 1.5–2.5)

## 2015-01-27 LAB — VITAMIN D 25 HYDROXY (VIT D DEFICIENCY, FRACTURES): Vit D, 25-Hydroxy: 63 ng/mL (ref 30–100)

## 2015-01-27 LAB — TESTOSTERONE: Testosterone: 285 ng/dL — ABNORMAL LOW (ref 300–890)

## 2015-01-27 LAB — URIC ACID: Uric Acid, Serum: 6.3 mg/dL (ref 4.0–7.8)

## 2015-01-27 LAB — HEMOGLOBIN A1C
HEMOGLOBIN A1C: 6 % — AB (ref <5.7)
Mean Plasma Glucose: 126 mg/dL — ABNORMAL HIGH (ref <117)

## 2015-01-27 LAB — INSULIN, RANDOM: Insulin: 11.5 u[IU]/mL (ref 2.0–19.6)

## 2015-01-27 LAB — TSH: TSH: 1.648 u[IU]/mL (ref 0.350–4.500)

## 2015-05-03 ENCOUNTER — Other Ambulatory Visit: Payer: Self-pay

## 2015-05-03 ENCOUNTER — Ambulatory Visit (INDEPENDENT_AMBULATORY_CARE_PROVIDER_SITE_OTHER): Payer: Managed Care, Other (non HMO) | Admitting: Physician Assistant

## 2015-05-03 ENCOUNTER — Encounter: Payer: Self-pay | Admitting: Physician Assistant

## 2015-05-03 VITALS — BP 120/82 | HR 52 | Temp 97.5°F | Resp 16 | Ht 70.0 in | Wt 168.0 lb

## 2015-05-03 DIAGNOSIS — R7309 Other abnormal glucose: Secondary | ICD-10-CM | POA: Diagnosis not present

## 2015-05-03 DIAGNOSIS — E559 Vitamin D deficiency, unspecified: Secondary | ICD-10-CM

## 2015-05-03 DIAGNOSIS — E782 Mixed hyperlipidemia: Secondary | ICD-10-CM

## 2015-05-03 DIAGNOSIS — M1000 Idiopathic gout, unspecified site: Secondary | ICD-10-CM

## 2015-05-03 DIAGNOSIS — I10 Essential (primary) hypertension: Secondary | ICD-10-CM | POA: Diagnosis not present

## 2015-05-03 DIAGNOSIS — Z79899 Other long term (current) drug therapy: Secondary | ICD-10-CM | POA: Diagnosis not present

## 2015-05-03 DIAGNOSIS — E291 Testicular hypofunction: Secondary | ICD-10-CM

## 2015-05-03 MED ORDER — TESTOSTERONE CYPIONATE 200 MG/ML IM SOLN: INTRAMUSCULAR | Status: DC

## 2015-05-03 MED ORDER — TADALAFIL 20 MG PO TABS: 20.0000 mg | ORAL_TABLET | Freq: Every day | ORAL | Status: DC | PRN

## 2015-05-03 NOTE — Patient Instructions (Signed)

## 2015-05-03 NOTE — Progress Notes (Signed)
Assessment and Plan:  Hypertension: Continue medication, monitor blood pressure at home. Continue DASH diet. Cholesterol: Continue diet and exercise. Check cholesterol.  Hypogonadism- continue replacement therapy, check testosterone levels as needed.  Vitamin D Def- check level and continue medications.  PreDM- will recheck.  Gout- recheck Uric acid, Diet discussed.  ADD/depression- Continue to see psychiatrist,  Continue ADD medication, helps with focus, no AE's. The patient was counseled on the addictive nature of the medication and was encouraged to take drug holidays when not needed.    Continue diet and meds as discussed. Further disposition pending results of labs.  HPI 60 y.o. male  presents for 3 month follow up with hypertension, hyperlipidemia, prediabetes and vitamin D. His blood pressure has been controlled at home, today their BP is BP: 120/82 mmHg He does not workou, but has a very physical job. He denies chest pain, shortness of breath, dizziness.  He is not on cholesterol medication and denies myalgias. His cholesterol is at goal. The cholesterol last visit was:   Lab Results  Component Value Date   CHOL 181 01/26/2015   HDL 34* 01/26/2015   LDLCALC 110 01/26/2015   TRIG 187* 01/26/2015   CHOLHDL 5.3* 01/26/2015   He is in the PreDM range, denies DM poly's, was on prednisone last visit 2 x for left arm epicondylitis.   Lab Results  Component Value Date   HGBA1C 6.0* 01/26/2015   Patient is on Vitamin D supplement.   Lab Results  Component Value Date   VD25OH 21 01/26/2015     He is has a history of testosterone deficiency and is on testosterone replacement, end of last week on Thursday did shot, does 1 cc q week. He states that the testosterone helps with his energy, libido, muscle mass. Lab Results  Component Value Date   TESTOSTERONE 285* 01/26/2015   Patient is on allopurinol for gout and does not report a recent flare.  Patient is on an ADD medication, he  states that the medication is helping and he denies any adverse reactions, he is on 3 a day, followed by Dr. Chauncy Lean  Gets up at 330 AM and going to bed at 730PM  Current Medications:  Current Outpatient Prescriptions on File Prior to Visit  Medication Sig Dispense Refill  . albuterol (PROVENTIL HFA;VENTOLIN HFA) 108 (90 BASE) MCG/ACT inhaler Inhale 2 puffs into the lungs every 6 (six) hours as needed for wheezing or shortness of breath. 1 Inhaler 2  . allopurinol (ZYLOPRIM) 300 MG tablet TAKE 1 TABLET (300 MG TOTAL) BY MOUTH DAILY. 90 tablet 0  . atenolol (TENORMIN) 100 MG tablet Take 1 tablet (100 mg total) by mouth daily. 90 tablet 4  . B-D 3CC LUER-LOK SYR 23GX1" 23G X 1" 3 ML MISC USE WITH TESTOSTERONE 100 each 6  . Cholecalciferol (VITAMIN D) 2000 UNITS tablet Take 2,000 Units by mouth 3 (three) times daily.     . clonazePAM (KLONOPIN) 1 MG tablet Take 1 mg by mouth 2 (two) times daily as needed. Takes up to 2 tabs daily prn    . enalapril (VASOTEC) 20 MG tablet TAKE 1 TABLET EVERY DAY FOR BLOOD PRESSURE 90 tablet 2  . Fluvoxamine Maleate 150 MG CP24 TAKE ONE CAPSULE BY MOUTH AT BEDTIME 30 capsule 1  . gabapentin (NEURONTIN) 800 MG tablet TAKE 6 TABLETS DAILY OR AS DIRECTED FOR PAIN 180 tablet 11  . meloxicam (MOBIC) 15 MG tablet TAKE 1 TABLET EVERY DAY AS NEEDED FOR PAIN/INFLAMMATION 90 tablet  1  . methylphenidate (RITALIN SR) 20 MG ER tablet Take 20 mg by mouth daily. Takes 3 per day    . mirtazapine (REMERON) 45 MG tablet Take 45 mg by mouth at bedtime.    Marland Kitchen NEEDLE, DISP, 21 G (YALE DISP NEEDLES 21GX1") 21G X 1" MISC Use with Depo-Testosterone injection 100 each 99  . Omega-3 Fatty Acids (FISH OIL) 1000 MG CAPS Take 1,000 capsules by mouth.    Marland Kitchen REXULTI 2 MG TABS     . testosterone cypionate (DEPOTESTOSTERONE CYPIONATE) 200 MG/ML injection INJECT 2 CC IM EVERY 2 WEEKS, AS DIRECTED 10 mL 2   No current facility-administered medications on file prior to visit.   Medical History:  Past  Medical History  Diagnosis Date  . Hypertension   . Depression   . Hyperlipidemia   . Elevated hemoglobin A1c   . Anemia   . ADD (attention deficit disorder)   . Elevated hemoglobin A1c   . Vitamin D deficiency    Allergies:  Allergies  Allergen Reactions  . Serzone [Nefazodone]     Leg cramps  . Trazamine [Trazodone & Diet Manage Prod] Other (See Comments)    confusion    Review of Systems  Constitutional: Negative.   HENT: Negative.   Eyes: Negative.   Respiratory: Negative.   Cardiovascular: Negative.   Gastrointestinal: Negative.   Genitourinary: Negative.   Musculoskeletal: Negative.   Skin: Negative.   Neurological: Negative.   Endo/Heme/Allergies: Negative.   Psychiatric/Behavioral: Negative.      Family history- Review and unchanged Social history- Review and unchanged Physical Exam: BP 120/82 mmHg  Pulse 52  Temp(Src) 97.5 F (36.4 C) (Temporal)  Resp 16  Ht  (1.778 m)  Wt 168 lb (76.204 kg)  BMI 24.11 kg/m2  SpO2 99% Wt Readings from Last 3 Encounters:  05/03/15 168 lb (76.204 kg)  01/26/15 170 lb 3.2 oz (77.202 kg)  01/12/15 170 lb (77.111 kg)   General Appearance: Well nourished, in no apparent distress. Eyes: PERRLA, EOMs, conjunctiva no swelling or erythema Sinuses: No Frontal/maxillary tenderness ENT/Mouth: Ext aud canals clear, TMs without erythema, bulging. No erythema, swelling, or exudate on post pharynx.  Tonsils not swollen or erythematous. Hearing normal.  Neck: Supple, thyroid normal.  Respiratory: Respiratory effort normal, BS equal bilaterally without rales, rhonchi, wheezing or stridor.  Cardio: RRR with no MRGs. Brisk peripheral pulses without edema.  Abdomen: Soft, + BS.  Non tender, no guarding, rebound, hernias, masses. Lymphatics: Non tender without lymphadenopathy.  Musculoskeletal: Full ROM, 5/5 strength, normal gait.  Skin: Warm, dry without rashes, lesions, ecchymosis.  Neuro: Cranial nerves intact. Normal muscle  tone, no cerebellar symptoms. Sensation intact.  Psych: Awake and oriented X 3, normal affect, Insight and Judgment appropriate.    Quentin Mulling 3:49 PM

## 2015-05-04 ENCOUNTER — Other Ambulatory Visit: Payer: Self-pay | Admitting: Internal Medicine

## 2015-05-04 LAB — CBC WITH DIFFERENTIAL/PLATELET
BASOS ABS: 0 10*3/uL (ref 0.0–0.1)
BASOS PCT: 0 % (ref 0–1)
EOS PCT: 1 % (ref 0–5)
Eosinophils Absolute: 0.1 10*3/uL (ref 0.0–0.7)
HCT: 42.9 % (ref 39.0–52.0)
HEMOGLOBIN: 13.3 g/dL (ref 13.0–17.0)
Lymphocytes Relative: 27 % (ref 12–46)
Lymphs Abs: 2.5 10*3/uL (ref 0.7–4.0)
MCH: 17.9 pg — ABNORMAL LOW (ref 26.0–34.0)
MCHC: 31 g/dL (ref 30.0–36.0)
MCV: 57.9 fL — ABNORMAL LOW (ref 78.0–100.0)
MONO ABS: 0.5 10*3/uL (ref 0.1–1.0)
Monocytes Relative: 5 % (ref 3–12)
NEUTROS ABS: 6.1 10*3/uL (ref 1.7–7.7)
Neutrophils Relative %: 67 % (ref 43–77)
Platelets: 203 10*3/uL (ref 150–400)
RBC: 7.41 MIL/uL — AB (ref 4.22–5.81)
RDW: 20.5 % — AB (ref 11.5–15.5)
WBC: 9.1 10*3/uL (ref 4.0–10.5)

## 2015-05-04 LAB — BASIC METABOLIC PANEL WITH GFR
BUN: 17 mg/dL (ref 7–25)
CALCIUM: 9.6 mg/dL (ref 8.6–10.3)
CO2: 27 mmol/L (ref 20–31)
Chloride: 100 mmol/L (ref 98–110)
Creat: 1.06 mg/dL (ref 0.70–1.33)
GFR, EST AFRICAN AMERICAN: 88 mL/min (ref >=60)
GFR, EST NON AFRICAN AMERICAN: 76 mL/min (ref >=60)
Glucose, Bld: 67 mg/dL (ref 65–99)
Potassium: 4 mmol/L (ref 3.5–5.3)
SODIUM: 138 mmol/L (ref 135–146)

## 2015-05-04 LAB — LIPID PANEL
Cholesterol: 188 mg/dL (ref 125–200)
HDL: 33 mg/dL — AB (ref >=40)
LDL CALC: 122 mg/dL (ref <130)
TRIGLYCERIDES: 166 mg/dL — AB (ref <150)
Total CHOL/HDL Ratio: 5.7 Ratio — ABNORMAL HIGH (ref <=5.0)
VLDL: 33 mg/dL — ABNORMAL HIGH (ref <30)

## 2015-05-04 LAB — HEPATIC FUNCTION PANEL
ALT: 36 U/L (ref 9–46)
AST: 54 U/L — ABNORMAL HIGH (ref 10–35)
Albumin: 4.4 g/dL (ref 3.6–5.1)
Alkaline Phosphatase: 92 U/L (ref 40–115)
BILIRUBIN DIRECT: 0.2 mg/dL (ref <=0.2)
Indirect Bilirubin: 0.6 mg/dL (ref 0.2–1.2)
TOTAL PROTEIN: 6.5 g/dL (ref 6.1–8.1)
Total Bilirubin: 0.8 mg/dL (ref 0.2–1.2)

## 2015-05-04 LAB — TSH: TSH: 1.69 m[IU]/L (ref 0.40–4.50)

## 2015-05-04 LAB — VITAMIN D 25 HYDROXY (VIT D DEFICIENCY, FRACTURES): VIT D 25 HYDROXY: 60 ng/mL (ref 30–100)

## 2015-05-04 LAB — HEMOGLOBIN A1C
Hgb A1c MFr Bld: 5.8 % — ABNORMAL HIGH (ref <5.7)
Mean Plasma Glucose: 120 mg/dL — ABNORMAL HIGH (ref <117)

## 2015-05-04 LAB — MAGNESIUM: Magnesium: 2 mg/dL (ref 1.5–2.5)

## 2015-06-10 ENCOUNTER — Other Ambulatory Visit: Payer: Self-pay | Admitting: Internal Medicine

## 2015-07-20 ENCOUNTER — Other Ambulatory Visit: Payer: Self-pay | Admitting: Internal Medicine

## 2015-07-20 DIAGNOSIS — E349 Endocrine disorder, unspecified: Secondary | ICD-10-CM

## 2015-07-21 ENCOUNTER — Other Ambulatory Visit: Payer: Self-pay | Admitting: Internal Medicine

## 2015-07-21 DIAGNOSIS — E349 Endocrine disorder, unspecified: Secondary | ICD-10-CM

## 2015-08-09 ENCOUNTER — Ambulatory Visit (INDEPENDENT_AMBULATORY_CARE_PROVIDER_SITE_OTHER): Payer: Managed Care, Other (non HMO) | Admitting: Internal Medicine

## 2015-08-09 ENCOUNTER — Encounter: Payer: Self-pay | Admitting: Internal Medicine

## 2015-08-09 VITALS — BP 114/76 | HR 48 | Temp 97.5°F | Resp 16 | Ht 69.5 in | Wt 165.0 lb

## 2015-08-09 DIAGNOSIS — Z125 Encounter for screening for malignant neoplasm of prostate: Secondary | ICD-10-CM

## 2015-08-09 DIAGNOSIS — Z0001 Encounter for general adult medical examination with abnormal findings: Secondary | ICD-10-CM

## 2015-08-09 DIAGNOSIS — Z0000 Encounter for general adult medical examination without abnormal findings: Secondary | ICD-10-CM

## 2015-08-09 DIAGNOSIS — Z136 Encounter for screening for cardiovascular disorders: Secondary | ICD-10-CM | POA: Diagnosis not present

## 2015-08-09 DIAGNOSIS — I10 Essential (primary) hypertension: Secondary | ICD-10-CM

## 2015-08-09 DIAGNOSIS — F988 Other specified behavioral and emotional disorders with onset usually occurring in childhood and adolescence: Secondary | ICD-10-CM

## 2015-08-09 DIAGNOSIS — Z1212 Encounter for screening for malignant neoplasm of rectum: Secondary | ICD-10-CM

## 2015-08-09 DIAGNOSIS — E559 Vitamin D deficiency, unspecified: Secondary | ICD-10-CM

## 2015-08-09 DIAGNOSIS — M1000 Idiopathic gout, unspecified site: Secondary | ICD-10-CM

## 2015-08-09 DIAGNOSIS — Z111 Encounter for screening for respiratory tuberculosis: Secondary | ICD-10-CM | POA: Diagnosis not present

## 2015-08-09 DIAGNOSIS — E291 Testicular hypofunction: Secondary | ICD-10-CM

## 2015-08-09 DIAGNOSIS — R5383 Other fatigue: Secondary | ICD-10-CM

## 2015-08-09 DIAGNOSIS — E782 Mixed hyperlipidemia: Secondary | ICD-10-CM

## 2015-08-09 DIAGNOSIS — R7309 Other abnormal glucose: Secondary | ICD-10-CM

## 2015-08-09 DIAGNOSIS — Z79899 Other long term (current) drug therapy: Secondary | ICD-10-CM

## 2015-08-09 NOTE — Patient Instructions (Signed)

## 2015-08-09 NOTE — Progress Notes (Signed)
Patient ID: Kevin BickersMichael Gomez, male   DOB: 08-22-1955, 60 y.o.   MRN: [id number]  [facility]   Kevin CowboyWilliam Macklyn Gomez, M.D.    Kevin CarrelAmanda R. Steffanie Gomez, [name]      Kevin Gomez, [name]   [facility]                [address]                [place] [id number] Telephone [phone] Telefax [phone]([phone] _________________________________  Annual  Screening/Preventative Visit And Comprehensive Evaluation & Examination     This very nice 60 y.o. [name] presents for a Wellness/Preventative Visit & comprehensive evaluation and management of multiple medical co-morbidities.  Patient has been followed for HTN, Prediabetes, Hyperlipidemia, Testosterone  and Vitamin D Deficiency. Patient has lon-standing hx/o bipolar depression and currently is followed by Kevin SloughLisa Gomez at [facility].      HTN predates since 1998. Patient's BP has been controlled at home.Today's BP: 114/76 mmHg. Patient denies any cardiac symptoms as chest pain, palpitations, shortness of breath, dizziness or ankle swelling.     Patient's hyperlipidemia is not controlled with diet and medications. Patient denies myalgias or other medication SE's. Last lipids were not at goal with  Cholesterol 188; HDL 33*; elevated LDL 122; Triglycerides 166 on 05/03/2015.     Patient has prediabetes since 2010 with A1c 5.8% and patient denies reactive hypoglycemic symptoms, visual blurring, diabetic polys or paresthesias. Last A1c was 5.8% on 05/03/2015.       Finally, patient has history of Vitamin D Deficiency of of "31" in 2008 and last vitamin D was 60 on 05/03/2015.    Medication Sig  . albuterol MDI HFA Inhale 2 puffs into the lungs every 6 (six) hours as needed for wheezing or shortness of breath.  . allopurinol  300 MG tablet TAKE 1 TABLET (300 MG TOTAL) BY MOUTH DAILY.  Marland Kitchen. VITAMIN D 2000 UNITS  Take 2,000 Units by mouth 3 (three) times daily.   . clonazePAM  1 MG  tablet Take 1 mg by mouth 2 (two) times daily as needed. Takes up to 2 tabs daily prn  . enalapril  20 MG tablet TAKE 1 TABLET EVERY DAY FOR BLOOD PRESSURE  . Fluvoxamine  150 MG CP24 TAKE ONE CAPSULE BY MOUTH AT BEDTIME  . gabapentin  800 MG tablet TAKE 6 TABLETS DAILY OR AS DIRECTED FOR PAIN  . meloxicam  15 MG tablet TAKE 1 TABLET EVERY DAY AS NEEDED FOR PAIN/INFLAMMATION  . methylphenidate 20 MG ER tablet Take 20 mg by mouth daily. Takes 3 per day  . mirtazapine  45 MG tablet Take 45 mg by mouth at bedtime.  . Omega-3 FISH OIL 1000 MG  Take 1,000 capsules by mouth.  . tadalafil  20 MG tablet Take 1 tablet (20 mg total) by mouth daily as needed for erectile dysfunction.  Marland Kitchen. testosterone cypionate 200 Mg/cc INJECT 2 CC IM EVERY 2 WEEKS, AS DIRECTED  . atenolol  100 MG tablet Take 1 tablet (100 mg total) by mouth daily.   Allergies  Allergen Reactions  . Serzone [Nefazodone]     Leg cramps  . Trazamine [Trazodone & Diet Manage Prod] Other (See Comments)    confusion   Past Medical History  Diagnosis Date  . Hypertension   . Depression   . Hyperlipidemia   . Elevated hemoglobin A1c   . Anemia   . ADD (attention deficit disorder)   .  Elevated hemoglobin A1c   . Vitamin D deficiency    Health Maintenance  Topic Date Due  . INFLUENZA VACCINE  10/05/2015  . COLONOSCOPY  08/18/2019  . TETANUS/TDAP  03/24/2022  . Hepatitis C Screening  Completed  . HIV Screening  Completed   Immunization History  Administered Date(s) Administered  . PPD Test 07/23/2013, 07/24/2014, 08/09/2015  . Pneumococcal-Unspecified 03/06/2009  . Td 03/24/2012   Past Surgical History  Procedure Laterality Date  . Fracture surgery    . Orthopedic surgery Left     left foot pinning  . Orthopedic surgery Left     left foot pinning  . Orthopedic surgery Right     right elbow bursectomy  1984  . Orthopedic surgery Right 2002 dr supple    right rotator cuff   Family History  Problem Relation Age of  Onset  . Cancer Mother     melonoma  . Hypertension Father   . Aortic stenosis Father    Social History   Social History  . Marital Status: Married    Spouse Name: N/A  . Number of Children: N/A  . Years of Education: N/A   Occupational History  . Works Holiday representative in produce   Social History Main Topics  . Smoking status: Never Smoker   . Smokeless tobacco: Not on file  . Alcohol Use: No  . Drug Use: Not on file  . Sexual Activity: Not on file    ROS Constitutional: Denies fever, chills, weight loss/gain, headaches, insomnia,  night sweats or change in appetite. Does c/o fatigue. Eyes: Denies redness, blurred vision, diplopia, discharge, itchy or watery eyes.  ENT: Denies discharge, congestion, post nasal drip, epistaxis, sore throat, earache, hearing loss, dental pain, Tinnitus, Vertigo, Sinus pain or snoring.  Cardio: Denies chest pain, palpitations, irregular heartbeat, syncope, dyspnea, diaphoresis, orthopnea, PND, claudication or edema Respiratory: denies cough, dyspnea, DOE, pleurisy, hoarseness, laryngitis or wheezing.  Gastrointestinal: Denies dysphagia, heartburn, reflux, water brash, pain, cramps, nausea, vomiting, bloating, diarrhea, constipation, hematemesis, melena, hematochezia, jaundice or hemorrhoids Genitourinary: Denies dysuria, frequency, urgency, nocturia, hesitancy, discharge, hematuria or flank pain Musculoskeletal: Denies arthralgia, myalgia, stiffness, Jt. Swelling, pain, limp or strain/sprain. Denies Falls. Skin: Denies puritis, rash, hives, warts, acne, eczema or change in skin lesion Neuro: No weakness, tremor, incoordination, spasms, paresthesia or pain Psychiatric: Denies confusion, memory loss or sensory loss. Denies Depression. Endocrine: Denies change in weight, skin, hair change, nocturia, and paresthesia, diabetic polys, visual blurring or hyper / hypo glycemic episodes.  Heme/Lymph: No excessive bleeding, bruising or enlarged lymph  nodes.  Physical Exam  BP 114/76 mmHg  Pulse 48  Temp(Src) 97.5 F (36.4 C)  Resp 16  Ht 5' 9.5" (1.765 m)  Wt 165 lb (74.844 kg)  BMI 24.03 kg/m2  General Appearance: Well nourished, in no apparent distress. Eyes: PERRLA, EOMs, conjunctiva no swelling or erythema, normal fundi and vessels. Sinuses: No frontal/maxillary tenderness ENT/Mouth: EACs patent / TMs  nl. Nares clear without erythema, swelling, mucoid exudates. Oral hygiene is good. No erythema, swelling, or exudate. Tongue normal, non-obstructing. Tonsils not swollen or erythematous. Hearing normal.  Neck: Supple, thyroid normal. No bruits, nodes or JVD. Respiratory: Respiratory effort normal.  BS equal and clear bilateral without rales, rhonci, wheezing or stridor. Cardio: Heart sounds are normal with regular rate and rhythm and no murmurs, rubs or gallops. Peripheral pulses are normal and equal bilaterally without edema. No aortic or femoral bruits. Chest: symmetric with normal excursions and percussion.  Abdomen: Soft, with Nl  bowel sounds. Nontender, no guarding, rebound, hernias, masses, or organomegaly.  Lymphatics: Non tender without lymphadenopathy.  Genitourinary: No hernias.Testes nl. DRE - prostate nl for age - smooth & firm w/o nodules. Musculoskeletal: Full ROM all peripheral extremities, joint stability, 5/5 strength, and normal gait. Skin: Warm and dry without rashes, lesions, cyanosis, clubbing or  ecchymosis.  Neuro: Cranial nerves intact, reflexes equal bilaterally. Normal muscle tone, no cerebellar symptoms. Sensation intact.  Pysch: Alert and oriented X 3 with normal affect, insight and judgment appropriate.   Assessment and Plan  1. Annual Preventative/Screening Exam   - Microalbumin / creatinine urine ratio - EKG 12-Lead - [place], RETROPERITNL ABD,  LTD - POC Hemoccult Bld/Stl  - Urinalysis, Routine w reflex microscopic - Vitamin B12 - Iron and TIBC - PSA - Testosterone - Uric acid - CBC with  Differential/Platelet - BASIC METABOLIC PANEL WITH GFR - Hepatic function panel - Magnesium - Lipid panel - TSH - Hemoglobin A1c - Insulin, random - VITAMIN D 25 Hydroxy   2. Essential hypertension  - Microalbumin / creatinine urine ratio - EKG 12-Lead - [place], RETROPERITNL ABD,  LTD - TSH  3. Hyperlipidemia  - Lipid panel - TSH  4. PreDiabetes  - Hemoglobin A1c - Insulin, random  5. Vitamin D deficiency  - VITAMIN D 25 Hydroxy   6. Idiopathic gout  - Uric acid  7. ADD (attention deficit disorder)   8. Testosterone Deficiency  - Testosterone  9. Medication management  - Urinalysis, Routine w reflex microscopic - CBC with Differential/Platelet - BASIC METABOLIC PANEL WITH GFR - Hepatic function panel - Magnesium  10. Screening for rectal cancer  - POC Hemoccult Bld/Stl   11. Prostate cancer screening  - PSA  12. Other fatigue  - Vitamin B12 - Iron and TIBC - Testosterone - CBC with Differential/Platelet - TSH  13. Screening examination for pulmonary tuberculosis  - PPD   Continue prudent diet as discussed, weight control, BP monitoring, regular exercise, and medications as discussed.  Discussed med effects and SE's. Routine screening labs and tests as requested with regular follow-up as recommended. Over 40 minutes of exam, counseling, chart review and high complex critical decision making was performed

## 2015-08-10 LAB — VITAMIN B12: Vitamin B-12: 867 pg/mL (ref 200–1100)

## 2015-08-10 LAB — BASIC METABOLIC PANEL WITH GFR
BUN: 17 mg/dL (ref 7–25)
CALCIUM: 9.8 mg/dL (ref 8.6–10.3)
CO2: 25 mmol/L (ref 20–31)
CREATININE: 1.05 mg/dL (ref 0.70–1.33)
Chloride: 103 mmol/L (ref 98–110)
GFR, EST AFRICAN AMERICAN: 89 mL/min (ref >=60)
GFR, Est Non African American: 77 mL/min (ref >=60)
Glucose, Bld: 84 mg/dL (ref 65–99)
Potassium: 5 mmol/L (ref 3.5–5.3)
SODIUM: 140 mmol/L (ref 135–146)

## 2015-08-10 LAB — URINALYSIS, ROUTINE W REFLEX MICROSCOPIC
BILIRUBIN URINE: NEGATIVE
Glucose, UA: NEGATIVE
Hgb urine dipstick: NEGATIVE
KETONES UR: NEGATIVE
Leukocytes, UA: NEGATIVE
Nitrite: NEGATIVE
PH: 5 (ref 5.0–8.0)
Protein, ur: NEGATIVE
SPECIFIC GRAVITY, URINE: 1.02 (ref 1.001–1.035)

## 2015-08-10 LAB — HEMOGLOBIN A1C
HEMOGLOBIN A1C: 5.8 % — AB (ref <5.7)
Mean Plasma Glucose: 120 mg/dL

## 2015-08-10 LAB — MICROALBUMIN / CREATININE URINE RATIO
CREATININE, URINE: 179 mg/dL (ref 20–370)
Microalb Creat Ratio: 6 mcg/mg creat (ref <30)
Microalb, Ur: 1 mg/dL

## 2015-08-10 LAB — CBC WITH DIFFERENTIAL/PLATELET
BASOS ABS: 68 {cells}/uL (ref 0–200)
Basophils Relative: 1 %
EOS PCT: 2 %
Eosinophils Absolute: 136 cells/uL (ref 15–500)
HCT: 47.4 % (ref 38.5–50.0)
Hemoglobin: 14.6 g/dL (ref 13.2–17.1)
Lymphocytes Relative: 25 %
Lymphs Abs: 1700 cells/uL (ref 850–3900)
MCH: 17.6 pg — AB (ref 27.0–33.0)
MCHC: 30.8 g/dL — AB (ref 32.0–36.0)
MCV: 57.2 fL — ABNORMAL LOW (ref 80.0–100.0)
MONOS PCT: 4 %
Monocytes Absolute: 272 cells/uL (ref 200–950)
NEUTROS ABS: 4624 {cells}/uL (ref 1500–7800)
NEUTROS PCT: 68 %
PLATELETS: 183 10*3/uL (ref 140–400)
RBC: 8.29 MIL/uL — AB (ref 4.20–5.80)
RDW: 22.4 % — ABNORMAL HIGH (ref 11.0–15.0)
WBC: 6.8 10*3/uL (ref 3.8–10.8)

## 2015-08-10 LAB — IRON AND TIBC
%SAT: 30 % (ref 15–60)
Iron: 126 ug/dL (ref 50–180)
TIBC: 424 ug/dL (ref 250–425)
UIBC: 298 ug/dL (ref 125–400)

## 2015-08-10 LAB — LIPID PANEL
CHOL/HDL RATIO: 5.5 ratio — AB (ref <=5.0)
CHOLESTEROL: 183 mg/dL (ref 125–200)
HDL: 33 mg/dL — AB (ref >=40)
LDL Cholesterol: 114 mg/dL (ref <130)
TRIGLYCERIDES: 182 mg/dL — AB (ref <150)
VLDL: 36 mg/dL — ABNORMAL HIGH (ref <30)

## 2015-08-10 LAB — HEPATIC FUNCTION PANEL
ALT: 34 U/L (ref 9–46)
AST: 40 U/L — ABNORMAL HIGH (ref 10–35)
Albumin: 4.4 g/dL (ref 3.6–5.1)
Alkaline Phosphatase: 84 U/L (ref 40–115)
BILIRUBIN DIRECT: 0.1 mg/dL (ref <=0.2)
BILIRUBIN TOTAL: 0.8 mg/dL (ref 0.2–1.2)
Indirect Bilirubin: 0.7 mg/dL (ref 0.2–1.2)
Total Protein: 6.1 g/dL (ref 6.1–8.1)

## 2015-08-10 LAB — INSULIN, RANDOM: Insulin: 3.5 u[IU]/mL (ref 2.0–19.6)

## 2015-08-10 LAB — URIC ACID: URIC ACID, SERUM: 6.1 mg/dL (ref 4.0–8.0)

## 2015-08-10 LAB — VITAMIN D 25 HYDROXY (VIT D DEFICIENCY, FRACTURES): VIT D 25 HYDROXY: 65 ng/mL (ref 30–100)

## 2015-08-10 LAB — TESTOSTERONE: Testosterone: 1154 ng/dL — ABNORMAL HIGH (ref 250–827)

## 2015-08-10 LAB — MAGNESIUM: MAGNESIUM: 1.9 mg/dL (ref 1.5–2.5)

## 2015-08-10 LAB — TSH: TSH: 1.54 mIU/L (ref 0.40–4.50)

## 2015-08-10 LAB — PSA: PSA: 0.36 ng/mL (ref <=4.00)

## 2015-08-12 LAB — TB SKIN TEST
INDURATION: 0 mm
TB Skin Test: NEGATIVE

## 2015-08-14 ENCOUNTER — Other Ambulatory Visit: Payer: Self-pay | Admitting: Physician Assistant

## 2015-08-14 ENCOUNTER — Encounter: Payer: Self-pay | Admitting: Internal Medicine

## 2015-09-02 ENCOUNTER — Other Ambulatory Visit: Payer: Self-pay | Admitting: Internal Medicine

## 2015-09-15 ENCOUNTER — Other Ambulatory Visit: Payer: Self-pay | Admitting: Internal Medicine

## 2015-10-21 ENCOUNTER — Other Ambulatory Visit: Payer: Self-pay | Admitting: Internal Medicine

## 2015-11-10 ENCOUNTER — Ambulatory Visit (INDEPENDENT_AMBULATORY_CARE_PROVIDER_SITE_OTHER): Payer: Managed Care, Other (non HMO) | Admitting: Internal Medicine

## 2015-11-10 ENCOUNTER — Encounter: Payer: Self-pay | Admitting: Internal Medicine

## 2015-11-10 VITALS — BP 148/80 | HR 54 | Temp 98.2°F | Resp 16 | Ht 69.5 in | Wt 162.0 lb

## 2015-11-10 DIAGNOSIS — M150 Primary generalized (osteo)arthritis: Secondary | ICD-10-CM

## 2015-11-10 DIAGNOSIS — R7309 Other abnormal glucose: Secondary | ICD-10-CM

## 2015-11-10 DIAGNOSIS — F32A Depression, unspecified: Secondary | ICD-10-CM

## 2015-11-10 DIAGNOSIS — F329 Major depressive disorder, single episode, unspecified: Secondary | ICD-10-CM | POA: Diagnosis not present

## 2015-11-10 DIAGNOSIS — Z79899 Other long term (current) drug therapy: Secondary | ICD-10-CM | POA: Diagnosis not present

## 2015-11-10 DIAGNOSIS — M159 Polyosteoarthritis, unspecified: Secondary | ICD-10-CM

## 2015-11-10 DIAGNOSIS — E559 Vitamin D deficiency, unspecified: Secondary | ICD-10-CM

## 2015-11-10 DIAGNOSIS — I10 Essential (primary) hypertension: Secondary | ICD-10-CM

## 2015-11-10 DIAGNOSIS — E782 Mixed hyperlipidemia: Secondary | ICD-10-CM

## 2015-11-10 LAB — BASIC METABOLIC PANEL WITH GFR
BUN: 22 mg/dL (ref 7–25)
CO2: 27 mmol/L (ref 20–31)
Calcium: 9.7 mg/dL (ref 8.6–10.3)
Chloride: 99 mmol/L (ref 98–110)
Creat: 1.03 mg/dL (ref 0.70–1.33)
GFR, Est Non African American: 79 mL/min (ref >=60)
GLUCOSE: 85 mg/dL (ref 65–99)
POTASSIUM: 4.8 mmol/L (ref 3.5–5.3)
Sodium: 136 mmol/L (ref 135–146)

## 2015-11-10 LAB — HEPATIC FUNCTION PANEL
ALBUMIN: 4.4 g/dL (ref 3.6–5.1)
ALK PHOS: 63 U/L (ref 40–115)
ALT: 31 U/L (ref 9–46)
AST: 41 U/L — ABNORMAL HIGH (ref 10–35)
Bilirubin, Direct: 0.3 mg/dL — ABNORMAL HIGH (ref <=0.2)
Indirect Bilirubin: 1.2 mg/dL (ref 0.2–1.2)
TOTAL PROTEIN: 6.5 g/dL (ref 6.1–8.1)
Total Bilirubin: 1.5 mg/dL — ABNORMAL HIGH (ref 0.2–1.2)

## 2015-11-10 LAB — LIPID PANEL
Cholesterol: 136 mg/dL (ref 125–200)
HDL: 33 mg/dL — AB (ref >=40)
LDL Cholesterol: 86 mg/dL (ref <130)
Total CHOL/HDL Ratio: 4.1 Ratio (ref <=5.0)
Triglycerides: 83 mg/dL (ref <150)
VLDL: 17 mg/dL (ref <30)

## 2015-11-10 MED ORDER — DICLOFENAC SODIUM 1.5 % TD SOLN: TRANSDERMAL | 2 refills | Status: DC

## 2015-11-10 MED ORDER — GABAPENTIN 800 MG PO TABS: ORAL_TABLET | ORAL | 1 refills | Status: DC

## 2015-11-10 NOTE — Progress Notes (Signed)
Assessment and Plan:  Hypertension:  -Continue medication,  -monitor blood pressure at home.  -Continue DASH diet.   -Reminder to go to the ER if any CP, SOB, nausea, dizziness, severe HA, changes vision/speech, left arm numbness and tingling, and jaw pain.  Cholesterol: -Continue diet and exercise.  -Check cholesterol.   Pre-diabetes: -Continue diet and exercise.  -Check A1C  Vitamin D Def: -check level -continue medications.   Depression -latuda refilled -cont meds -well controlled currently  Joint pains -already on meloxicam -try voltaren vs. pennsaid  Continue diet and meds as discussed. Further disposition pending results of labs.  HPI 60 y.o. male  presents for 3 month follow up with hypertension, hyperlipidemia, prediabetes and vitamin D.   His blood pressure has been controlled at home, today their BP is BP: (!) 148/80.   He does not workout. He denies chest pain, shortness of breath, dizziness.   He is on cholesterol medication and denies myalgias. His cholesterol is at goal. The cholesterol last visit was:   Lab Results  Component Value Date   CHOL 183 08/09/2015   HDL 33 (L) 08/09/2015   LDLCALC 114 08/09/2015   TRIG 182 (H) 08/09/2015   CHOLHDL 5.5 (H) 08/09/2015     He has been working on diet and exercise for prediabetes, and denies foot ulcerations, hyperglycemia, hypoglycemia , increased appetite, nausea, paresthesia of the feet, polydipsia, polyuria, visual disturbances, vomiting and weight loss. Last A1C in the office was:  Lab Results  Component Value Date   HGBA1C 5.8 (H) 08/09/2015    Patient is on Vitamin D supplement.  Lab Results  Component Value Date   VD25OH 60 08/09/2015     He reports that he has been having some back and joint pain that have been bothersome due to working.  He reports that he does try to do a lot of stretching.    Current Medications:  Current Outpatient Prescriptions on File Prior to Visit  Medication Sig  Dispense Refill  . allopurinol (ZYLOPRIM) 300 MG tablet TAKE 1 TABLET (300 MG TOTAL) BY MOUTH DAILY. 90 tablet 0  . atenolol (TENORMIN) 100 MG tablet TAKE 1 TABLET (100 MG TOTAL) BY MOUTH DAILY. 90 tablet 4  . B-D 3CC LUER-LOK SYR 23GX1" 23G X 1" 3 ML MISC USE WITH TESTOSTERONE 100 each 6  . Cholecalciferol (VITAMIN D) 2000 UNITS tablet Take 2,000 Units by mouth 3 (three) times daily.     . clonazePAM (KLONOPIN) 1 MG tablet Take 1 mg by mouth 2 (two) times daily as needed. Takes up to 2 tabs daily prn    . enalapril (VASOTEC) 20 MG tablet TAKE 1 TABLET EVERY DAY FOR BLOOD PRESSURE 90 tablet 2  . Fluvoxamine Maleate 150 MG CP24 TAKE ONE CAPSULE BY MOUTH AT BEDTIME 30 capsule 1  . gabapentin (NEURONTIN) 800 MG tablet TAKE 6 TABLETS DAILY OR AS DIRECTED FOR PAIN 540 tablet 1  . meloxicam (MOBIC) 15 MG tablet TAKE 1 TABLET EVERY DAY AS NEEDED FOR PAIN/INFLAMMATION 90 tablet 1  . methylphenidate (RITALIN SR) 20 MG ER tablet Take 20 mg by mouth daily. Takes 3 per day    . mirtazapine (REMERON) 45 MG tablet Take 45 mg by mouth at bedtime.    Marland Kitchen NEEDLE, DISP, 21 G (YALE DISP NEEDLES 21GX1") 21G X 1" MISC Use with Depo-Testosterone injection 100 each 99  . Omega-3 Fatty Acids (FISH OIL) 1000 MG CAPS Take 1,000 capsules by mouth.    Marland Kitchen PROAIR HFA 108 (90  Base) MCG/ACT inhaler INHALE 2 PUFFS INTO THE LUNGS EVERY 6 HOURS AS NEEDED FOR WHEEZING OR SHORTNESS OF BREATH 8.5 each 0  . tadalafil (CIALIS) 20 MG tablet Take 1 tablet (20 mg total) by mouth daily as needed for erectile dysfunction. 30 tablet 2  . testosterone cypionate (DEPOTESTOSTERONE CYPIONATE) 200 MG/ML injection INJECT 2 CC IM EVERY 2 WEEKS, AS DIRECTED 10 mL 2   No current facility-administered medications on file prior to visit.     Medical History:  Past Medical History:  Diagnosis Date  . ADD (attention deficit disorder)   . Anemia   . Depression   . Elevated hemoglobin A1c   . Elevated hemoglobin A1c   . Hyperlipidemia   .  Hypertension   . Vitamin D deficiency     Allergies:  Allergies  Allergen Reactions  . Serzone [Nefazodone]     Leg cramps  . Trazamine [Trazodone & Diet Manage Prod] Other (See Comments)    confusion     Review of Systems:  Review of Systems  Constitutional: Negative for chills, fever and malaise/fatigue.  HENT: Negative for congestion, ear pain and sore throat.   Eyes: Negative.   Respiratory: Negative for cough, shortness of breath and wheezing.   Cardiovascular: Negative for chest pain, palpitations and leg swelling.  Gastrointestinal: Negative for abdominal pain, blood in stool, constipation, diarrhea, heartburn and melena.  Genitourinary: Negative.   Musculoskeletal: Positive for back pain and joint pain.  Skin: Negative.   Neurological: Negative for dizziness, sensory change, loss of consciousness and headaches.  Psychiatric/Behavioral: Negative for depression. The patient is not nervous/anxious and does not have insomnia.     Family history- Review and unchanged  Social history- Review and unchanged  Physical Exam: BP (!) 148/80   Pulse (!) 54   Temp 98.2 F (36.8 C) (Temporal)   Resp 16   Ht 5' 9.5" (1.765 m)   Wt 162 lb (73.5 kg)   BMI 23.58 kg/m  Wt Readings from Last 3 Encounters:  11/10/15 162 lb (73.5 kg)  08/09/15 165 lb (74.8 kg)  05/03/15 168 lb (76.2 kg)    General Appearance: Well nourished well developed, in no apparent distress. Eyes: PERRLA, EOMs, conjunctiva no swelling or erythema ENT/Mouth: Ear canals normal without obstruction, swelling, erythma, discharge.  TMs normal bilaterally.  Oropharynx moist, clear, without exudate, or postoropharyngeal swelling. Neck: Supple, thyroid normal,no cervical adenopathy  Respiratory: Respiratory effort normal, Breath sounds clear A&P without rhonchi, wheeze, or rale.  No retractions, no accessory usage. Cardio: RRR with no MRGs. Brisk peripheral pulses without edema.  Abdomen: Soft, + BS,  Non tender,  no guarding, rebound, hernias, masses. Musculoskeletal: Full ROM, 5/5 strength, Normal gait Skin: Warm, dry without rashes, lesions, ecchymosis.  Neuro: Awake and oriented X 3, Cranial nerves intact. Normal muscle tone, no cerebellar symptoms. Psych: Flat affect, Insight and Judgment appropriate.    [name], PA-C 10:16 AM [facility]

## 2015-11-11 ENCOUNTER — Other Ambulatory Visit: Payer: Self-pay | Admitting: *Deleted

## 2015-11-11 LAB — CBC WITH DIFFERENTIAL/PLATELET
Basophils Absolute: 0 cells/uL (ref 0–200)
Basophils Relative: 0 %
EOS ABS: 0 {cells}/uL — AB (ref 15–500)
Eosinophils Relative: 0 %
HEMATOCRIT: 46.4 % (ref 38.5–50.0)
Hemoglobin: 14.8 g/dL (ref 13.2–17.1)
LYMPHS PCT: 18 %
Lymphs Abs: 2214 cells/uL (ref 850–3900)
MCH: 18.7 pg — ABNORMAL LOW (ref 27.0–33.0)
MCHC: 31.9 g/dL — AB (ref 32.0–36.0)
MCV: 58.6 fL — AB (ref 80.0–100.0)
MONO ABS: 615 {cells}/uL (ref 200–950)
Monocytes Relative: 5 %
NEUTROS PCT: 77 %
Neutro Abs: 9471 cells/uL — ABNORMAL HIGH (ref 1500–7800)
Platelets: 192 10*3/uL (ref 140–400)
RBC: 7.92 MIL/uL — AB (ref 4.20–5.80)
RDW: 19.4 % — AB (ref 11.0–15.0)
WBC: 12.3 10*3/uL — ABNORMAL HIGH (ref 3.8–10.8)

## 2015-11-11 LAB — TSH: TSH: 1.36 m[IU]/L (ref 0.40–4.50)

## 2015-11-11 LAB — HEMOGLOBIN A1C
HEMOGLOBIN A1C: 5.1 % (ref <5.7)
MEAN PLASMA GLUCOSE: 100 mg/dL

## 2015-11-11 MED ORDER — DICLOFENAC SODIUM 1 % TD GEL: 4.0000 g | Freq: Four times a day (QID) | TRANSDERMAL | 0 refills | Status: DC

## 2015-12-11 ENCOUNTER — Other Ambulatory Visit: Payer: Self-pay | Admitting: Internal Medicine

## 2015-12-16 ENCOUNTER — Other Ambulatory Visit: Payer: Self-pay | Admitting: *Deleted

## 2015-12-16 DIAGNOSIS — Z0001 Encounter for general adult medical examination with abnormal findings: Secondary | ICD-10-CM

## 2015-12-16 DIAGNOSIS — Z1212 Encounter for screening for malignant neoplasm of rectum: Secondary | ICD-10-CM

## 2015-12-16 LAB — POC HEMOCCULT BLD/STL (HOME/3-CARD/SCREEN)
Card #2 Fecal Occult Blod, POC: NEGATIVE
FECAL OCCULT BLD: NEGATIVE
Fecal Occult Blood, POC: NEGATIVE

## 2016-01-28 ENCOUNTER — Other Ambulatory Visit: Payer: Self-pay | Admitting: Internal Medicine

## 2016-02-15 ENCOUNTER — Ambulatory Visit (INDEPENDENT_AMBULATORY_CARE_PROVIDER_SITE_OTHER): Payer: Managed Care, Other (non HMO) | Admitting: Internal Medicine

## 2016-02-15 VITALS — BP 136/90 | HR 56 | Temp 97.5°F | Resp 16 | Ht 69.5 in | Wt 161.6 lb

## 2016-02-15 DIAGNOSIS — F900 Attention-deficit hyperactivity disorder, predominantly inattentive type: Secondary | ICD-10-CM

## 2016-02-15 DIAGNOSIS — M1000 Idiopathic gout, unspecified site: Secondary | ICD-10-CM | POA: Diagnosis not present

## 2016-02-15 DIAGNOSIS — R7309 Other abnormal glucose: Secondary | ICD-10-CM | POA: Diagnosis not present

## 2016-02-15 DIAGNOSIS — E559 Vitamin D deficiency, unspecified: Secondary | ICD-10-CM

## 2016-02-15 DIAGNOSIS — Z79899 Other long term (current) drug therapy: Secondary | ICD-10-CM | POA: Diagnosis not present

## 2016-02-15 DIAGNOSIS — E291 Testicular hypofunction: Secondary | ICD-10-CM

## 2016-02-15 DIAGNOSIS — I10 Essential (primary) hypertension: Secondary | ICD-10-CM

## 2016-02-15 DIAGNOSIS — E782 Mixed hyperlipidemia: Secondary | ICD-10-CM

## 2016-02-15 LAB — TSH: TSH: 1.61 mIU/L (ref 0.40–4.50)

## 2016-02-15 NOTE — Patient Instructions (Signed)

## 2016-02-15 NOTE — Progress Notes (Signed)
[place], M.D.        Kevin CarrelAmanda R. Kevin Gomez, [name]       [facility], [name]  [facility]                [address]                [place], [place] [phone] Telephone [phone] Telefax [phone] ______________________________________________________________________     This very nice  Kevin Gomez presents for 3 month follow up with Hypertension, Hyperlipidemia, Pre-Diabetes and Vitamin D Deficiency. Patient has hx/o bipolar depression and is followedat [name] by Kevin SloughLisa Gomez. Patient also has hx/o Gout apparently in remission and controlled on meds.      Patient is treated for HTN (1998) & BP has been controlled at home. Today's BP is sl elevated at 136/90. Patient has had no complaints of any cardiac type chest pain, palpitations, dyspnea/orthopnea/PND, dizziness, claudication, or dependent edema.     Hyperlipidemia is controlled with diet & meds. Patient denies myalgias or other med SE's. Last Lipids were at goal: Lab Results  Component Value Date   CHOL 136 11/10/2015   HDL 33 (L) 11/10/2015   LDLCALC 86 11/10/2015   TRIG 83 11/10/2015   CHOLHDL 4.1 11/10/2015      Also, the patient has history of PreDiabetes with A1c at 5.8% circa 2010  and has had no symptoms of reactive hypoglycemia, diabetic polys, paresthesias or visual blurring.  Last A1c was at goal: Lab Results  Component Value Date   HGBA1C 5.1 11/10/2015      Patient ois also on testosterone parenteral replacement for Low Testosterone with improved sense of well-being. Further, the patient also has history of Vitamin D Deficiency in 2008 of "31" and supplements vitamin D without any suspected side-effects. Last vitamin D was at goal: Lab Results  Component Value Date   VD25OH 65 08/09/2015   Current Outpatient Prescriptions on File Prior to Visit  Medication Sig  . allopurinol [300 MG tablet TAKE 1 TAB  DAILY.  Marland Kitchen. atenolol  100 MG tablet TAKE 1 TAB DAILY.  Marland Kitchen. REXULTI 0.5 MG  Take 1 mg  daily.   . Cholecalciferol2000 UNITS  Take 2,000 Units  3times daily.   . clonazePAM  1 MG tablet Takes up to 2 tabs daily prn  . diclofenac  1 % GEL APPLY 4 GRAMS  4  TIMES DAILY  . enalapril  20 MG tablet TAKE 1 TAB EVERY DAY   . gabapentin - 800 MG - TAKE 6 TABLETS DAILY OR AS DIRECTED FOR PAIN  . meloxicam - 15 MG - TAKE 1 TAB/DAY AS NEEDED  . Methylphenidate-SR 20 MG ER- Take 20 mg by mouth daily. Takes 3 per day  . mirtazapine  45 MG - Take at bedtime.  . Omega-3 FISH OIL 1000 MG - Take 1,000 units   . PROAIR HFA  inhaler 2 PUFFS EVERY 6 HOURS AS NEEDED   . tadalafil (CIALIS) 20 MG - Take 1 tab daily as needed   . testosterone cypio 200 MG injec INJECT 2 CC IM EVERY 2 WEEKS   No current facility-administered medications on file prior to visit.    Allergies  Allergen Reactions  . Serzone [Nefazodone]     Leg cramps  . Trazamine [Trazodone & Diet Manage Prod] Other (See Comments)    confusion   PMHx:   Past Medical History:  Diagnosis Date  . ADD (attention  deficit disorder)   . Anemia   . Depression   . Elevated hemoglobin A1c   . Elevated hemoglobin A1c   . Hyperlipidemia   . Hypertension   . Vitamin D deficiency    Immunization History  Administered Date(s) Administered  . Influenza-Unspecified 12/06/2015  . PPD Test 07/23/2013, 07/24/2014, 08/09/2015  . Pneumococcal-Unspecified 03/06/2009  . Td 03/24/2012   Past Surgical History:  Procedure Laterality Date  . FRACTURE SURGERY    . ORTHOPEDIC SURGERY Left    left foot pinning  . ORTHOPEDIC SURGERY Left    left foot pinning  . ORTHOPEDIC SURGERY Right    right elbow bursectomy  1984  . ORTHOPEDIC SURGERY Right 2002 dr supple   right rotator cuff   FHx:    Reviewed / unchanged  SHx:    Reviewed / unchanged  Systems Review:  Constitutional: Denies fever, chills, wt changes, headaches, insomnia, fatigue, night sweats,  change in appetite. Eyes: Denies redness, blurred vision, diplopia, discharge, itchy, watery eyes.  ENT: Denies discharge, congestion, post nasal drip, epistaxis, sore throat, earache, hearing loss, dental pain, tinnitus, vertigo, sinus pain, snoring.  CV: Denies chest pain, palpitations, irregular heartbeat, syncope, dyspnea, diaphoresis, orthopnea, PND, claudication or edema. Respiratory: denies cough, dyspnea, DOE, pleurisy, hoarseness, laryngitis, wheezing.  Gastrointestinal: Denies dysphagia, odynophagia, heartburn, reflux, water brash, abdominal pain or cramps, nausea, vomiting, bloating, diarrhea, constipation, hematemesis, melena, hematochezia  or hemorrhoids. Genitourinary: Denies dysuria, frequency, urgency, nocturia, hesitancy, discharge, hematuria or flank pain. Musculoskeletal: Denies arthralgias, myalgias, stiffness, jt. swelling, pain, limping or strain/sprain.  Skin: Denies pruritus, rash, hives, warts, acne, eczema or change in skin lesion(s). Neuro: No weakness, tremor, incoordination, spasms, paresthesia or pain. Psychiatric: Denies confusion, memory loss or sensory loss. Endo: Denies change in weight, skin or hair change.  Heme/Lymph: No excessive bleeding, bruising or enlarged lymph nodes.  Physical Exam BP 136/90   Pulse (!) 56   Temp 97.5 F (36.4 C)   Resp 16   Ht 5' 9.5" (1.765 m)   Wt 161 lb 9.6 oz (73.3 kg)   BMI 23.52 kg/m   Appears well nourished and in no distress.  Eyes: PERRLA, EOMs, conjunctiva no swelling or erythema. Sinuses: No frontal/maxillary tenderness ENT/Mouth: EAC's clear, TM's nl w/o erythema, bulging. Nares clear w/o erythema, swelling, exudates. Oropharynx clear without erythema or exudates. Oral hygiene is good. Tongue normal, non obstructing. Hearing intact.  Neck: Supple. Thyroid nl. Car 2+/2+ without bruits, nodes or JVD. Chest: Respirations nl with BS clear & equal w/o rales, rhonchi, wheezing or stridor.  Cor: Heart sounds normal w/  regular rate and rhythm without sig. murmurs, gallops, clicks, or rubs. Peripheral pulses normal and equal  without edema.  Abdomen: Soft & bowel sounds normal. Non-tender w/o guarding, rebound, hernias, masses, or organomegaly.  Lymphatics: Unremarkable.  Musculoskeletal: Full ROM all peripheral extremities, joint stability, 5/5 strength, and normal gait.  Skin: Warm, dry without exposed rashes, lesions or ecchymosis apparent.  Neuro: Cranial nerves intact, reflexes equal bilaterally. Sensory-motor testing grossly intact. Tendon reflexes grossly intact.  Pysch: Alert & oriented x 3.  Insight and judgement nl & appropriate. No ideations.  Assessment and Plan:  - Continue medication, monitor blood pressure at home. Continue DASH diet. Reminder to go to the ER if any CP, SOB, nausea, dizziness, severe HA, changes vision/speech, left arm numbness and tingling and jaw pain. - Continue diet/meds, exercise,& lifestyle modifications. Continue monitor periodic cholesterol/liver & renal functions   - Continue diet, exercise, lifestyle modifications. Monitor appropriate  labs. - Continue supplementation.      Recommended regular exercise, BP monitoring, weight control, and discussed med and SE's. Recommended labs to assess and monitor clinical status. Further disposition pending results of labs. Over 30 minutes of exam, counseling, chart review was performed

## 2016-02-16 LAB — CBC WITH DIFFERENTIAL/PLATELET
BASOS PCT: 0 %
Basophils Absolute: 0 cells/uL (ref 0–200)
EOS ABS: 91 {cells}/uL (ref 15–500)
Eosinophils Relative: 1 %
HEMATOCRIT: 45.3 % (ref 38.5–50.0)
Hemoglobin: 14 g/dL (ref 13.2–17.1)
LYMPHS PCT: 20 %
Lymphs Abs: 1820 cells/uL (ref 850–3900)
MCH: 18.7 pg — ABNORMAL LOW (ref 27.0–33.0)
MCHC: 30.9 g/dL — ABNORMAL LOW (ref 32.0–36.0)
MCV: 60.5 fL — AB (ref 80.0–100.0)
MONO ABS: 364 {cells}/uL (ref 200–950)
Monocytes Relative: 4 %
NEUTROS ABS: 6825 {cells}/uL (ref 1500–7800)
Neutrophils Relative %: 75 %
PLATELETS: 160 10*3/uL (ref 140–400)
RBC: 7.49 MIL/uL — AB (ref 4.20–5.80)
RDW: 20.6 % — AB (ref 11.0–15.0)
WBC: 9.1 10*3/uL (ref 3.8–10.8)

## 2016-02-16 LAB — HEPATIC FUNCTION PANEL
ALK PHOS: 57 U/L (ref 40–115)
ALT: 31 U/L (ref 9–46)
AST: 49 U/L — AB (ref 10–35)
Albumin: 4.2 g/dL (ref 3.6–5.1)
BILIRUBIN INDIRECT: 0.8 mg/dL (ref 0.2–1.2)
BILIRUBIN TOTAL: 1 mg/dL (ref 0.2–1.2)
Bilirubin, Direct: 0.2 mg/dL (ref <=0.2)
Total Protein: 6 g/dL — ABNORMAL LOW (ref 6.1–8.1)

## 2016-02-16 LAB — HEMOGLOBIN A1C
HEMOGLOBIN A1C: 5.1 % (ref <5.7)
Mean Plasma Glucose: 100 mg/dL

## 2016-02-16 LAB — BASIC METABOLIC PANEL WITH GFR
BUN: 14 mg/dL (ref 7–25)
CHLORIDE: 101 mmol/L (ref 98–110)
CO2: 29 mmol/L (ref 20–31)
Calcium: 9.3 mg/dL (ref 8.6–10.3)
Creat: 0.92 mg/dL (ref 0.70–1.33)
Glucose, Bld: 117 mg/dL — ABNORMAL HIGH (ref 65–99)
POTASSIUM: 4.7 mmol/L (ref 3.5–5.3)
Sodium: 139 mmol/L (ref 135–146)

## 2016-02-16 LAB — URIC ACID: URIC ACID, SERUM: 4.9 mg/dL (ref 4.0–8.0)

## 2016-02-16 LAB — LIPID PANEL
CHOL/HDL RATIO: 5.4 ratio — AB (ref <5.0)
CHOLESTEROL: 158 mg/dL (ref <200)
HDL: 29 mg/dL — AB (ref >40)
LDL Cholesterol: 104 mg/dL — ABNORMAL HIGH (ref <100)
Triglycerides: 123 mg/dL (ref <150)
VLDL: 25 mg/dL (ref <30)

## 2016-02-16 LAB — VITAMIN D 25 HYDROXY (VIT D DEFICIENCY, FRACTURES): Vit D, 25-Hydroxy: 75 ng/mL (ref 30–100)

## 2016-02-16 LAB — TESTOSTERONE: TESTOSTERONE: 1126 ng/dL — AB (ref 250–827)

## 2016-02-16 LAB — MAGNESIUM: Magnesium: 1.9 mg/dL (ref 1.5–2.5)

## 2016-02-16 LAB — INSULIN, RANDOM: Insulin: 4.9 u[IU]/mL (ref 2.0–19.6)

## 2016-02-19 ENCOUNTER — Encounter: Payer: Self-pay | Admitting: Internal Medicine

## 2016-02-24 ENCOUNTER — Other Ambulatory Visit: Payer: Self-pay | Admitting: Internal Medicine

## 2016-02-27 ENCOUNTER — Other Ambulatory Visit: Payer: Self-pay | Admitting: Internal Medicine

## 2016-02-27 DIAGNOSIS — E349 Endocrine disorder, unspecified: Secondary | ICD-10-CM

## 2016-02-27 NOTE — Telephone Encounter (Signed)
Please call d-Test

## 2016-03-10 ENCOUNTER — Other Ambulatory Visit: Payer: Self-pay | Admitting: Internal Medicine

## 2016-03-22 ENCOUNTER — Other Ambulatory Visit: Payer: Self-pay | Admitting: Internal Medicine

## 2016-05-06 ENCOUNTER — Other Ambulatory Visit: Payer: Self-pay | Admitting: Internal Medicine

## 2016-05-18 ENCOUNTER — Encounter: Payer: Self-pay | Admitting: Physician Assistant

## 2016-05-18 ENCOUNTER — Ambulatory Visit (INDEPENDENT_AMBULATORY_CARE_PROVIDER_SITE_OTHER): Payer: Managed Care, Other (non HMO) | Admitting: Physician Assistant

## 2016-05-18 VITALS — BP 120/76 | HR 56 | Temp 97.9°F | Resp 16 | Ht 69.5 in | Wt 162.4 lb

## 2016-05-18 DIAGNOSIS — Z79899 Other long term (current) drug therapy: Secondary | ICD-10-CM | POA: Diagnosis not present

## 2016-05-18 DIAGNOSIS — I10 Essential (primary) hypertension: Secondary | ICD-10-CM

## 2016-05-18 DIAGNOSIS — E782 Mixed hyperlipidemia: Secondary | ICD-10-CM | POA: Diagnosis not present

## 2016-05-18 LAB — BASIC METABOLIC PANEL WITH GFR
BUN: 12 mg/dL (ref 7–25)
CHLORIDE: 102 mmol/L (ref 98–110)
CO2: 32 mmol/L — AB (ref 20–31)
Calcium: 9.2 mg/dL (ref 8.6–10.3)
Creat: 0.81 mg/dL (ref 0.70–1.25)
GFR, Est African American: >89 mL/min (ref >=60)
Glucose, Bld: 81 mg/dL (ref 65–99)
Potassium: 4.5 mmol/L (ref 3.5–5.3)
SODIUM: 140 mmol/L (ref 135–146)

## 2016-05-18 LAB — CBC WITH DIFFERENTIAL/PLATELET
BASOS PCT: 0 %
Basophils Absolute: 0 cells/uL (ref 0–200)
EOS PCT: 1 %
Eosinophils Absolute: 74 cells/uL (ref 15–500)
HCT: 44.9 % (ref 38.5–50.0)
HEMOGLOBIN: 13.8 g/dL (ref 13.2–17.1)
LYMPHS ABS: 1628 {cells}/uL (ref 850–3900)
Lymphocytes Relative: 22 %
MCH: 18.7 pg — ABNORMAL LOW (ref 27.0–33.0)
MCHC: 30.7 g/dL — ABNORMAL LOW (ref 32.0–36.0)
MCV: 60.8 fL — ABNORMAL LOW (ref 80.0–100.0)
Monocytes Absolute: 370 cells/uL (ref 200–950)
Monocytes Relative: 5 %
NEUTROS ABS: 5328 {cells}/uL (ref 1500–7800)
Neutrophils Relative %: 72 %
Platelets: 191 10*3/uL (ref 140–400)
RBC: 7.39 MIL/uL — ABNORMAL HIGH (ref 4.20–5.80)
RDW: 21.1 % — ABNORMAL HIGH (ref 11.0–15.0)
WBC: 7.4 10*3/uL (ref 3.8–10.8)

## 2016-05-18 LAB — HEPATIC FUNCTION PANEL
ALT: 29 U/L (ref 9–46)
AST: 36 U/L — AB (ref 10–35)
Albumin: 3.9 g/dL (ref 3.6–5.1)
Alkaline Phosphatase: 71 U/L (ref 40–115)
BILIRUBIN DIRECT: 0.2 mg/dL (ref <=0.2)
BILIRUBIN INDIRECT: 1.2 mg/dL (ref 0.2–1.2)
Total Bilirubin: 1.4 mg/dL — ABNORMAL HIGH (ref 0.2–1.2)
Total Protein: 5.8 g/dL — ABNORMAL LOW (ref 6.1–8.1)

## 2016-05-18 LAB — LIPID PANEL
CHOL/HDL RATIO: 5 ratio — AB (ref <5.0)
CHOLESTEROL: 139 mg/dL (ref <200)
HDL: 28 mg/dL — ABNORMAL LOW (ref >40)
LDL Cholesterol: 85 mg/dL (ref <100)
Triglycerides: 132 mg/dL (ref <150)
VLDL: 26 mg/dL (ref <30)

## 2016-05-18 LAB — TSH: TSH: 1.35 mIU/L (ref 0.40–4.50)

## 2016-05-18 MED ORDER — MELOXICAM 15 MG PO TABS: ORAL_TABLET | ORAL | 1 refills | Status: DC

## 2016-05-18 NOTE — Patient Instructions (Signed)

## 2016-05-18 NOTE — Progress Notes (Signed)
Assessment and Plan:  1. Essential hypertension - continue medications, DASH diet, exercise and monitor at home. Call if greater than 130/80.  - CBC with Differential/Platelet - BASIC METABOLIC PANEL WITH GFR - Hepatic function panel - TSH  2. Hyperlipidemia -continue medications, check lipids, decrease fatty foods, increase activity.  - Lipid panel  3. Medication management   Continue diet and meds as discussed. Further disposition pending results of labs. Future Appointments Date Time Provider Department Center  08/30/2016 9:00 AM [name], MD [facility] None    HPI 61 y.o. male  presents for 3 month follow up with hypertension, hyperlipidemia, prediabetes and vitamin D. His blood pressure has been controlled at home, today their BP is BP: 120/76 He does not workout, but has a very physical job. He denies chest pain, shortness of breath, dizziness.  Going to have to put sam, [facility] down today, 61 year old today.  He is not on cholesterol medication and denies myalgias. His cholesterol is at goal. The cholesterol last visit was:   Lab Results  Component Value Date   CHOL 158 02/15/2016   HDL 29 (L) 02/15/2016   LDLCALC 104 (H) 02/15/2016   TRIG 123 02/15/2016   CHOLHDL 5.4 (H) 02/15/2016   He is in the PreDM range, denies DM poly's Lab Results  Component Value Date   HGBA1C 5.1 02/15/2016   Patient is on Vitamin D supplement.   Lab Results  Component Value Date   VD25OH 10 02/15/2016     He is has a history of testosterone deficiency and is on testosterone replacement, end of last week on Thursday did shot, does 1 cc q week. He states that the testosterone helps with his energy, libido, muscle mass. Lab Results  Component Value Date   TESTOSTERONE 1,126 (H) 02/15/2016   Patient is on allopurinol for gout and does not report a recent flare.  Patient is on an ADD medication and on depression medication, followed by Dr. Chauncy Lean. Waiting to hear back from some  jobs.   Current Medications:  Current Outpatient Prescriptions on File Prior to Visit  Medication Sig Dispense Refill  . allopurinol (ZYLOPRIM) 300 MG tablet TAKE 1 TABLET (300 MG TOTAL) BY MOUTH DAILY. 90 tablet 0  . atenolol (TENORMIN) 100 MG tablet TAKE 1 TABLET (100 MG TOTAL) BY MOUTH DAILY. 90 tablet 4  . B-D 3CC LUER-LOK SYR 21GX1" 21G X 1" 3 ML MISC USE WITH DEPO-TESTOSTERONE INJECTION 100 each PRN  . Cholecalciferol (VITAMIN D) 2000 UNITS tablet Take 2,000 Units by mouth 3 (three) times daily.     . clonazePAM (KLONOPIN) 1 MG tablet Take 1 mg by mouth 2 (two) times daily as needed. Takes up to 2 tabs daily prn    . diclofenac sodium (VOLTAREN) 1 % GEL APPLY 4 GRAMS TOPICALLY FOUR TIMES A DAY 300 g 1  . DULoxetine (CYMBALTA) 60 MG capsule Take 60 mg by mouth 2 (two) times daily.    . enalapril (VASOTEC) 20 MG tablet TAKE 1 TABLET EVERY DAY FOR BLOOD PRESSURE 90 tablet 1  . gabapentin (NEURONTIN) 800 MG tablet TAKE 6 TABLETS BY MOUTH DAILY OR AS DIRECTED FOR PAIN 540 tablet 0  . meloxicam (MOBIC) 15 MG tablet TAKE 1 TABLET ONCE DAILY AS NEEDED FOR  PAIN/INFLAMMATION. 90 tablet 0  . methylphenidate (RITALIN SR) 20 MG ER tablet Take 20 mg by mouth daily. Takes 3 per day    . mirtazapine (REMERON) 45 MG tablet Take 45 mg by mouth  at bedtime.    Marland Kitchen. NEEDLE, DISP, 21 G (YALE DISP NEEDLES 21GX1") 21G X 1" MISC Use with Depo-Testosterone injection 100 each 99  . Omega-3 Fatty Acids (FISH OIL) 1000 MG CAPS Take 1,000 capsules by mouth.    Marland Kitchen. PROAIR HFA 108 (90 Base) MCG/ACT inhaler INHALE 2 PUFFS INTO THE LUNGS EVERY 6 HOURS AS NEEDED FOR WHEEZING OR SHORTNESS OF BREATH 8.5 each 0  . REXULTI 1 MG TABS     . tadalafil (CIALIS) 20 MG tablet Take 1 tablet (20 mg total) by mouth daily as needed for erectile dysfunction. 30 tablet 2  . testosterone cypionate (DEPOTESTOSTERONE CYPIONATE) 200 MG/ML injection INJECT 2 CC IM EVERY 2 WEEKS, AS DIRECTED 10 mL 3   No current facility-administered medications  on file prior to visit.    Medical History:  Past Medical History:  Diagnosis Date  . ADD (attention deficit disorder)   . Anemia   . Depression   . Elevated hemoglobin A1c   . Elevated hemoglobin A1c   . Hyperlipidemia   . Hypertension   . Vitamin D deficiency    Allergies:  Allergies  Allergen Reactions  . Serzone [Nefazodone]     Leg cramps  . Trazamine [Trazodone & Diet Manage Prod] Other (See Comments)    confusion    Review of Systems  Constitutional: Negative.   HENT: Negative.   Eyes: Negative.   Respiratory: Negative.   Cardiovascular: Negative.   Gastrointestinal: Negative.   Genitourinary: Negative.   Musculoskeletal: Negative.   Skin: Negative.   Neurological: Negative.   Endo/Heme/Allergies: Negative.   Psychiatric/Behavioral: Negative.     Family history- Review and unchanged Social history- Review and unchanged Physical Exam: BP 120/76   Pulse (!) 56   Temp 97.9 F (36.6 C)   Resp 16   Ht 5' 9.5" (1.765 m)   Wt 162 lb 6.4 oz (73.7 kg)   SpO2 99%   BMI 23.64 kg/m  Wt Readings from Last 3 Encounters:  05/18/16 162 lb 6.4 oz (73.7 kg)  02/15/16 161 lb 9.6 oz (73.3 kg)  11/10/15 162 lb (73.5 kg)   General Appearance: Well nourished, in no apparent distress. Eyes: PERRLA, EOMs, conjunctiva no swelling or erythema Sinuses: No Frontal/maxillary tenderness ENT/Mouth: Ext aud canals clear, TMs without erythema, bulging. No erythema, swelling, or exudate on post pharynx.  Tonsils not swollen or erythematous. Hearing normal.  Neck: Supple, thyroid normal.  Respiratory: Respiratory effort normal, BS equal bilaterally without rales, rhonchi, wheezing or stridor.  Cardio: RRR with no MRGs. Brisk peripheral pulses without edema.  Abdomen: Soft, + BS.  Non tender, no guarding, rebound, hernias, masses. Lymphatics: Non tender without lymphadenopathy.  Musculoskeletal: Full ROM, 5/5 strength, normal gait.  Skin: Warm, dry without rashes, lesions,  ecchymosis.  Neuro: Cranial nerves intact. Normal muscle tone, no cerebellar symptoms. Sensation intact.  Psych: Awake and oriented X 3, normal affect, Insight and Judgment appropriate.    Quentin MullingAmanda Xyon Lukasik 9:46 AM

## 2016-06-07 ENCOUNTER — Other Ambulatory Visit: Payer: Self-pay | Admitting: Internal Medicine

## 2016-07-17 ENCOUNTER — Other Ambulatory Visit: Payer: Self-pay | Admitting: *Deleted

## 2016-07-17 MED ORDER — ENALAPRIL MALEATE 20 MG PO TABS: ORAL_TABLET | ORAL | 1 refills | Status: DC

## 2016-08-14 ENCOUNTER — Other Ambulatory Visit: Payer: Self-pay | Admitting: *Deleted

## 2016-08-14 MED ORDER — GABAPENTIN 800 MG PO TABS: ORAL_TABLET | ORAL | 0 refills | Status: DC

## 2016-08-15 ENCOUNTER — Other Ambulatory Visit: Payer: Self-pay | Admitting: *Deleted

## 2016-08-15 MED ORDER — GABAPENTIN 800 MG PO TABS: ORAL_TABLET | ORAL | 0 refills | Status: DC

## 2016-08-30 ENCOUNTER — Other Ambulatory Visit: Payer: Self-pay | Admitting: *Deleted

## 2016-08-30 ENCOUNTER — Encounter: Payer: Self-pay | Admitting: Internal Medicine

## 2016-08-30 ENCOUNTER — Ambulatory Visit (INDEPENDENT_AMBULATORY_CARE_PROVIDER_SITE_OTHER): Payer: BC Managed Care – PPO | Admitting: Internal Medicine

## 2016-08-30 VITALS — BP 136/90 | HR 48 | Temp 97.5°F | Resp 16 | Ht 69.25 in | Wt 173.4 lb

## 2016-08-30 DIAGNOSIS — Z0001 Encounter for general adult medical examination with abnormal findings: Secondary | ICD-10-CM

## 2016-08-30 DIAGNOSIS — Z79899 Other long term (current) drug therapy: Secondary | ICD-10-CM | POA: Diagnosis not present

## 2016-08-30 DIAGNOSIS — E291 Testicular hypofunction: Secondary | ICD-10-CM | POA: Diagnosis not present

## 2016-08-30 DIAGNOSIS — R5383 Other fatigue: Secondary | ICD-10-CM

## 2016-08-30 DIAGNOSIS — R7309 Other abnormal glucose: Secondary | ICD-10-CM

## 2016-08-30 DIAGNOSIS — F32A Depression, unspecified: Secondary | ICD-10-CM

## 2016-08-30 DIAGNOSIS — M1000 Idiopathic gout, unspecified site: Secondary | ICD-10-CM

## 2016-08-30 DIAGNOSIS — F329 Major depressive disorder, single episode, unspecified: Secondary | ICD-10-CM | POA: Diagnosis not present

## 2016-08-30 DIAGNOSIS — Z111 Encounter for screening for respiratory tuberculosis: Secondary | ICD-10-CM | POA: Diagnosis not present

## 2016-08-30 DIAGNOSIS — Z1212 Encounter for screening for malignant neoplasm of rectum: Secondary | ICD-10-CM

## 2016-08-30 DIAGNOSIS — E782 Mixed hyperlipidemia: Secondary | ICD-10-CM

## 2016-08-30 DIAGNOSIS — E559 Vitamin D deficiency, unspecified: Secondary | ICD-10-CM

## 2016-08-30 DIAGNOSIS — Z125 Encounter for screening for malignant neoplasm of prostate: Secondary | ICD-10-CM

## 2016-08-30 DIAGNOSIS — F900 Attention-deficit hyperactivity disorder, predominantly inattentive type: Secondary | ICD-10-CM

## 2016-08-30 DIAGNOSIS — Z136 Encounter for screening for cardiovascular disorders: Secondary | ICD-10-CM

## 2016-08-30 DIAGNOSIS — I10 Essential (primary) hypertension: Secondary | ICD-10-CM | POA: Diagnosis not present

## 2016-08-30 LAB — CBC WITH DIFFERENTIAL/PLATELET
Basophils Absolute: 0 cells/uL (ref 0–200)
Basophils Relative: 0 %
Eosinophils Absolute: 164 cells/uL (ref 15–500)
Eosinophils Relative: 2 %
HCT: 45.3 % (ref 38.5–50.0)
Hemoglobin: 14.4 g/dL (ref 13.2–17.1)
Lymphocytes Relative: 29 %
Lymphs Abs: 2378 cells/uL (ref 850–3900)
MCH: 18.8 pg — ABNORMAL LOW (ref 27.0–33.0)
MCHC: 31.8 g/dL — ABNORMAL LOW (ref 32.0–36.0)
MCV: 59.3 fL — ABNORMAL LOW (ref 80.0–100.0)
MONOS PCT: 7 %
Monocytes Absolute: 574 cells/uL (ref 200–950)
NEUTROS ABS: 5084 {cells}/uL (ref 1500–7800)
Neutrophils Relative %: 62 %
PLATELETS: 195 10*3/uL (ref 140–400)
RBC: 7.64 MIL/uL — AB (ref 4.20–5.80)
RDW: 20.7 % — AB (ref 11.0–15.0)
WBC: 8.2 10*3/uL (ref 3.8–10.8)

## 2016-08-30 LAB — TSH: TSH: 2.33 m[IU]/L (ref 0.40–4.50)

## 2016-08-30 MED ORDER — DICLOFENAC SODIUM 1 % TD GEL: TRANSDERMAL | 0 refills | Status: DC

## 2016-08-30 NOTE — Patient Instructions (Addendum)

## 2016-08-30 NOTE — Progress Notes (Signed)
[facility]   Kevin Gomez, M.D.      [name]. Steffanie Dunn, [name] [facility]                [address]                [name], [place]. [address] Telephone [phone] Telefax [phone] Annual  Screening/Preventative Visit  & Comprehensive Evaluation & Examination     This very nice 61 y.o. [name] presents for a Screening/Preventative Visit & comprehensive evaluation and management of multiple medical co-morbidities.  Patient has been followed for HTN, T2_NIDDM  Prediabetes, Hyperlipidemia and Vitamin D Deficiency. Patient is followed for hx/o bipolar depression at [name] by [name]. Patient also has hx/o Gout controlled on meds.     HTN predates since 1998. Patient's BP has been controlled at home.  Today's BP is at goal - 136/90. Patient denies any cardiac symptoms as chest pain, palpitations, shortness of breath, dizziness or ankle swelling.     Patient's hyperlipidemia is controlled with diet and medications. Patient denies myalgias or other medication SE's. Last lipids were at goal:  Lab Results  Component Value Date   CHOL 139 05/18/2016   HDL 28 (L) 05/18/2016   LDLCALC 85 05/18/2016   TRIG 132 05/18/2016   CHOLHDL 5.0 (H) 05/18/2016      Patient has prediabetes (A1c 5.8% in 2010)  and patient denies reactive hypoglycemic symptoms, visual blurring, diabetic polys or paresthesias. Last A1c was at goal: Lab Results  Component Value Date   HGBA1C 5.1 02/15/2016       Patient has hx/o Testosterone Deficiency and is on parenteral replacement with improved stamina, libido and sense of well being. Finally, patient has history of Vitamin D Deficiency ("31" in 2008) and last vitamin D was at goal: Lab Results  Component Value Date   VD25OH 42 02/15/2016   Current Outpatient Prescriptions on File Prior to Visit  Medication Sig  . atenolol (TENORMIN) 100 MG tablet TAKE 1 TABLET (100 MG  TOTAL) BY MOUTH DAILY.  Marland Kitchen B-D 3CC LUER-LOK SYR 21GX1" 21G X 1" 3 ML MISC USE WITH DEPO-TESTOSTERONE INJECTION  . Cholecalciferol (VITAMIN D) 2000 UNITS tablet Take 2,000 Units by mouth 3 (three) times daily.   . clonazePAM (KLONOPIN) 1 MG tablet Take 1 mg by mouth 2 (two) times daily as needed. Takes up to 2 tabs daily prn  . DULoxetine (CYMBALTA) 60 MG capsule Take 60 mg by mouth 2 (two) times daily.  . enalapril (VASOTEC) 20 MG tablet TAKE 1 TABLET EVERY DAY FOR BLOOD PRESSURE  . gabapentin (NEURONTIN) 800 MG tablet TAKE 6 TABLETS BY MOUTH DAILY OR AS DIRECTED FOR PAIN  . meloxicam (MOBIC) 15 MG tablet TAKE 1 TABLET ONCE DAILY AS NEEDED FOR  PAIN/INFLAMMATION.  . methylphenidate (RITALIN SR) 20 MG ER tablet Take 20 mg by mouth daily. Takes 3 per day  . mirtazapine (REMERON) 45 MG tablet Take 45 mg by mouth at bedtime.  Marland Kitchen NEEDLE, DISP, 21 G (YALE DISP NEEDLES 21GX1") 21G X 1" MISC Use with Depo-Testosterone injection  . Omega-3 Fatty Acids (FISH OIL) 1000 MG CAPS Take 1,000 capsules by mouth.  Marland Kitchen PROAIR HFA 108 (90 Base) MCG/ACT inhaler INHALE 2 PUFFS INTO THE LUNGS EVERY 6 HOURS AS NEEDED FOR WHEEZING OR SHORTNESS OF BREATH  . REXULTI 1 MG TABS   . tadalafil (CIALIS) 20 MG tablet Take 1 tablet (20 mg total) by mouth daily  as needed for erectile dysfunction.  Marland Kitchen. testosterone cypionate (DEPOTESTOSTERONE CYPIONATE) 200 MG/ML injection INJECT 2 CC IM EVERY 2 WEEKS, AS DIRECTED   No current facility-administered medications on file prior to visit.    Allergies  Allergen Reactions  . Serzone [Nefazodone]     Leg cramps  . Trazamine [Trazodone & Diet Manage Prod] Other (See Comments)    confusion   Past Medical History:  Diagnosis Date  . ADD (attention deficit disorder)   . Anemia   . Depression   . Elevated hemoglobin A1c   . Elevated hemoglobin A1c   . Hyperlipidemia   . Hypertension   . Vitamin D deficiency    Health Maintenance  Topic Date Due  . INFLUENZA VACCINE  10/04/2016  .  COLONOSCOPY  08/18/2019  . TETANUS/TDAP  03/24/2022  . Hepatitis C Screening  Completed  . HIV Screening  Completed   Immunization History  Administered Date(s) Administered  . Influenza-Unspecified 12/06/2015  . PPD Test 07/23/2013, 07/24/2014, 08/09/2015  . Pneumococcal-Unspecified 03/06/2009  . Td 03/24/2012   Past Surgical History:  Procedure Laterality Date  . FRACTURE SURGERY    . ORTHOPEDIC SURGERY Left    left foot pinning  . ORTHOPEDIC SURGERY Left    left foot pinning  . ORTHOPEDIC SURGERY Right    right elbow bursectomy  1984  . ORTHOPEDIC SURGERY Right 2002 dr supple   right rotator cuff   Family History  Problem Relation Age of Onset  . Cancer Mother        melonoma  . Hypertension Father   . Aortic stenosis Father    Social History   Social History  . Marital status: Married    Spouse name: N/A  . Number of children: N/A  . Years of education: N/A   Occupational History  .    Social History Main Topics  . Smoking status: Never Smoker  . Smokeless tobacco: Never Used  . Alcohol use No  . Drug use: Unknown  . Sexual activity: Not on file    ROS Constitutional: Denies fever, chills, weight loss/gain, headaches, insomnia,  night sweats or change in appetite. Does c/o fatigue. Eyes: Denies redness, blurred vision, diplopia, discharge, itchy or watery eyes.  ENT: Denies discharge, congestion, post nasal drip, epistaxis, sore throat, earache, hearing loss, dental pain, Tinnitus, Vertigo, Sinus pain or snoring.  Cardio: Denies chest pain, palpitations, irregular heartbeat, syncope, dyspnea, diaphoresis, orthopnea, PND, claudication or edema Respiratory: denies cough, dyspnea, DOE, pleurisy, hoarseness, laryngitis or wheezing.  Gastrointestinal: Denies dysphagia, heartburn, reflux, water brash, pain, cramps, nausea, vomiting, bloating, diarrhea, constipation, hematemesis, melena, hematochezia, jaundice or hemorrhoids Genitourinary: Denies dysuria,  frequency, urgency, nocturia, hesitancy, discharge, hematuria or flank pain Musculoskeletal: Denies arthralgia, myalgia, stiffness, Jt. Swelling, pain, limp or strain/sprain. Denies Falls. Skin: Denies puritis, rash, hives, warts, acne, eczema or change in skin lesion Neuro: No weakness, tremor, incoordination, spasms, paresthesia or pain Psychiatric: Denies confusion, memory loss or sensory loss. Denies Depression. Endocrine: Denies change in weight, skin, hair change, nocturia, and paresthesia, diabetic polys, visual blurring or hyper / hypo glycemic episodes.  Heme/Lymph: No excessive bleeding, bruising or enlarged lymph nodes.  Physical Exam  BP 136/90   Pulse (!) 48   Temp 97.5 F (36.4 C)   Resp 16   Ht 5' 9.25" (1.759 m)   Wt 173 lb 6.4 oz (78.7 kg)   BMI 25.42 kg/m   General Appearance: Well nourished and well groomed and in no apparent distress.  Eyes: PERRLA,  EOMs, conjunctiva no swelling or erythema, normal fundi and vessels. Sinuses: No frontal/maxillary tenderness ENT/Mouth: EACs patent / TMs  nl. Nares clear without erythema, swelling, mucoid exudates. Oral hygiene is good. No erythema, swelling, or exudate. Tongue normal, non-obstructing. Tonsils not swollen or erythematous. Hearing normal.  Neck: Supple, thyroid normal. No bruits, nodes or JVD. Respiratory: Respiratory effort normal.  BS equal and clear bilateral without rales, rhonci, wheezing or stridor. Cardio: Heart sounds are normal with regular rate and rhythm and no murmurs, rubs or gallops. Peripheral pulses are normal and equal bilaterally without edema. No aortic or femoral bruits. Chest: symmetric with normal excursions and percussion.  Abdomen: Soft, with Nl bowel sounds. Nontender, no guarding, rebound, hernias, masses, or organomegaly.  Lymphatics: Non tender without lymphadenopathy.  Genitourinary: No hernias.Testes nl. DRE - prostate nl for age - smooth & firm w/o nodules. Musculoskeletal: Full ROM all  peripheral extremities, joint stability, 5/5 strength, and normal gait. Skin: Warm and dry without rashes, lesions, cyanosis, clubbing or  ecchymosis.  Neuro: Cranial nerves intact, reflexes equal bilaterally. Normal muscle tone, no cerebellar symptoms. Sensation intact.  Pysch: Alert and oriented X 3 with normal affect, insight and judgment appropriate.   Assessment and Plan  1. Annual Preventative/Screening Exam   2. Essential hypertension  - EKG 12-Lead - [place], RETROPERITNL ABD,  LTD - Urinalysis, Routine w reflex microscopic - Microalbumin / creatinine urine ratio - CBC with Differential/Platelet - BASIC METABOLIC PANEL WITH GFR - Magnesium - TSH  3. Hyperlipidemia, mixed  - EKG 12-Lead - [place], RETROPERITNL ABD,  LTD - Hepatic function panel - Lipid panel - TSH  4. PreDiabetes  - EKG 12-Lead - [place], RETROPERITNL ABD,  LTD - Hemoglobin A1c - Insulin, random  5. Vitamin D deficiency  - VITAMIN D 25 Hydroxy   6. Idiopathic gout  - Uric acid  7. Testosterone Deficiency  - Testosterone  8. Attention deficit hyperactivity disorder (ADHD), predominantly inattentive type   9. Depression, controlled   10. Screening for rectal cancer  - POC Hemoccult Bld/Stl - Urinalysis, Routine w reflex microscopic  11. Prostate cancer screening  - PSA  12. Screening examination for pulmonary tuberculosis  - PPD  13. Screening for ischemic heart disease  - EKG 12-Lead  14. Screening for AAA (aortic abdominal aneurysm)  - [place], RETROPERITNL ABD,  LTD  15. Fatigue, unspecified type  - Vitamin B12 - Iron and TIBC - Testosterone  16. Medication management  - Urinalysis, Routine w reflex microscopic - Microalbumin / creatinine urine ratio - Testosterone - CBC with Differential/Platelet - BASIC METABOLIC PANEL WITH GFR - Hepatic function panel - Magnesium - Lipid panel - TSH - Hemoglobin A1c - Insulin, random - VITAMIN D 25 Hydroxy  - Uric acid      Patient  was counseled in prudent diet, weight control to achieve/maintain BMI less than 25, BP monitoring, regular exercise and medications as discussed.  Discussed med effects and SE's. Routine screening labs and tests as requested with regular follow-up as recommended. Over 40 minutes of exam, counseling, chart review and high complex critical decision making was performed

## 2016-08-31 LAB — HEPATIC FUNCTION PANEL
ALK PHOS: 73 U/L (ref 40–115)
ALT: 38 U/L (ref 9–46)
AST: 49 U/L — AB (ref 10–35)
Albumin: 4.4 g/dL (ref 3.6–5.1)
BILIRUBIN DIRECT: 0.1 mg/dL (ref <=0.2)
BILIRUBIN TOTAL: 0.9 mg/dL (ref 0.2–1.2)
Indirect Bilirubin: 0.8 mg/dL (ref 0.2–1.2)
Total Protein: 6.4 g/dL (ref 6.1–8.1)

## 2016-08-31 LAB — URINALYSIS, ROUTINE W REFLEX MICROSCOPIC
BILIRUBIN URINE: NEGATIVE
Glucose, UA: NEGATIVE
HGB URINE DIPSTICK: NEGATIVE
Leukocytes, UA: NEGATIVE
Nitrite: NEGATIVE
PROTEIN: NEGATIVE
Specific Gravity, Urine: 1.019 (ref 1.001–1.035)
pH: 5.5 (ref 5.0–8.0)

## 2016-08-31 LAB — BASIC METABOLIC PANEL WITH GFR
BUN: 22 mg/dL (ref 7–25)
CHLORIDE: 100 mmol/L (ref 98–110)
CO2: 21 mmol/L (ref 20–31)
CREATININE: 1.12 mg/dL (ref 0.70–1.25)
Calcium: 9.3 mg/dL (ref 8.6–10.3)
GFR, EST AFRICAN AMERICAN: 82 mL/min (ref >=60)
GFR, Est Non African American: 71 mL/min (ref >=60)
Glucose, Bld: 72 mg/dL (ref 65–99)
POTASSIUM: 4.6 mmol/L (ref 3.5–5.3)
Sodium: 137 mmol/L (ref 135–146)

## 2016-08-31 LAB — LIPID PANEL
CHOL/HDL RATIO: 6.9 ratio — AB (ref <5.0)
Cholesterol: 165 mg/dL (ref <200)
HDL: 24 mg/dL — AB (ref >40)
LDL Cholesterol: 68 mg/dL (ref <100)
TRIGLYCERIDES: 365 mg/dL — AB (ref <150)
VLDL: 73 mg/dL — ABNORMAL HIGH (ref <30)

## 2016-08-31 LAB — TESTOSTERONE: TESTOSTERONE: 622 ng/dL (ref 250–827)

## 2016-08-31 LAB — IRON AND TIBC
%SAT: 26 % (ref 15–60)
Iron: 121 ug/dL (ref 50–180)
TIBC: 458 ug/dL — ABNORMAL HIGH (ref 250–425)
UIBC: 337 ug/dL

## 2016-08-31 LAB — URIC ACID: Uric Acid, Serum: 6.8 mg/dL (ref 4.0–8.0)

## 2016-08-31 LAB — MICROALBUMIN / CREATININE URINE RATIO
CREATININE, URINE: 233 mg/dL (ref 20–370)
Microalb Creat Ratio: 3 mcg/mg creat (ref <30)
Microalb, Ur: 0.7 mg/dL

## 2016-08-31 LAB — VITAMIN B12: Vitamin B-12: 696 pg/mL (ref 200–1100)

## 2016-08-31 LAB — HEMOGLOBIN A1C
Hgb A1c MFr Bld: 5.2 % (ref <5.7)
Mean Plasma Glucose: 103 mg/dL

## 2016-08-31 LAB — INSULIN, RANDOM: INSULIN: 5.2 u[IU]/mL (ref 2.0–19.6)

## 2016-08-31 LAB — MAGNESIUM: MAGNESIUM: 2 mg/dL (ref 1.5–2.5)

## 2016-08-31 LAB — PSA: PSA: 0.5 ng/mL (ref <=4.0)

## 2016-08-31 LAB — VITAMIN D 25 HYDROXY (VIT D DEFICIENCY, FRACTURES): VIT D 25 HYDROXY: 58 ng/mL (ref 30–100)

## 2016-09-03 ENCOUNTER — Encounter: Payer: Self-pay | Admitting: Internal Medicine

## 2016-09-04 LAB — TB SKIN TEST
Induration: 0 mm
TB SKIN TEST: NEGATIVE

## 2016-09-26 ENCOUNTER — Other Ambulatory Visit: Payer: Self-pay | Admitting: Internal Medicine

## 2016-10-03 ENCOUNTER — Other Ambulatory Visit: Payer: Self-pay | Admitting: *Deleted

## 2016-10-03 DIAGNOSIS — Z1212 Encounter for screening for malignant neoplasm of rectum: Secondary | ICD-10-CM

## 2016-10-03 LAB — POC HEMOCCULT BLD/STL (HOME/3-CARD/SCREEN)
Card #2 Fecal Occult Blod, POC: NEGATIVE
FECAL OCCULT BLD: NEGATIVE
Fecal Occult Blood, POC: NEGATIVE

## 2016-10-24 ENCOUNTER — Other Ambulatory Visit: Payer: Self-pay | Admitting: Internal Medicine

## 2016-11-08 ENCOUNTER — Other Ambulatory Visit: Payer: Self-pay | Admitting: Physician Assistant

## 2016-11-10 ENCOUNTER — Other Ambulatory Visit: Payer: Self-pay | Admitting: Internal Medicine

## 2016-11-14 ENCOUNTER — Encounter: Payer: Self-pay | Admitting: Internal Medicine

## 2016-11-14 ENCOUNTER — Other Ambulatory Visit: Payer: Self-pay | Admitting: Internal Medicine

## 2016-11-14 MED ORDER — ALLOPURINOL 300 MG PO TABS: 300.0000 mg | ORAL_TABLET | Freq: Every day | ORAL | 3 refills | Status: DC

## 2016-12-18 ENCOUNTER — Encounter: Payer: Self-pay | Admitting: *Deleted

## 2016-12-21 ENCOUNTER — Encounter: Payer: Self-pay | Admitting: Internal Medicine

## 2016-12-25 NOTE — Progress Notes (Signed)
FOLLOW UP  Assessment and Plan:   Hypertension The current medical regimen is effective;  continue present plan and medications. Monitor blood pressure at home; call if consistently greater than 130/80 Continue DASH diet.   Reminder to go to the ER if any CP, SOB, nausea, dizziness, severe HA, changes vision/speech, left arm numbness and tingling and jaw pain.  Cholesterol Not currently on medication; pending labs may start statin or fibrate Continue diet and exercise.  Check lipid panel.   Preiabetes  Well controlled by lifestyle  Continue diet and exercise.  Perform daily foot/skin check, notify office of any concerning changes.  Check A1C  Obesity with co morbidities Long discussion about weight loss, diet, and exercise Discussed ideal weight for height (below 170) - he plans to work towards this goal Patient will work on restarting exercise Will follow up in 3 months  Vitamin D Def/ osteoporosis prevention Continue supplementation Check Vit D level  Hereditary spherocytosis CBC - monitor for anemia  Gout Continue medications Diet discussed Check uric acid as needed  Bipolar depression Is continuing to follow up with Dr. Azucena Fallen, appears to be doing well on current medications.  Encouraged to work on stress, mindfulness, start exercising, and possibly follow up with counseling.   Continue diet and meds as discussed. Further disposition pending results of labs. Discussed med's effects and SE's.   Over 30 minutes of exam, counseling, chart review, and critical decision making was performed.   Future Appointments Date Time Provider Department Center  03/28/2017 9:30 AM Kevin Cowboy, MD [facility] None  09/14/2017 9:00 AM Kevin Cowboy, MD [facility] None    ----------------------------------------------------------------------------------------------------------------------  HPI 60 y.o. male  presents for 3 month follow up on hypertension, cholesterol,  prediabetes, weight, gout and vitamin D deficiency. Patient is followed for hx/o bipolar depression and ADD at [name] by [name]. He reports he was switched form rexulti to risperidone, with most recent episode depressive but is doing well other than experiencing some stress related to interviewing for a job.   BMI is Body mass index is 27.71 kg/m., he has been working on diet, but has not been exercising, is currently  Wt Readings from Last 3 Encounters:  12/26/16 189 lb (85.7 kg)  08/30/16 173 lb 6.4 oz (78.7 kg)  05/18/16 162 lb 6.4 oz (73.7 kg)   His blood pressure has been controlled at home (130/85), today their BP is BP: (!) 144/94  He was working out, but has been busi. He denies chest pain, shortness of breath, dizziness.   He is not on cholesterol medication and denies myalgias. His cholesterol is not at goal. The cholesterol last visit was:   Lab Results  Component Value Date   CHOL 165 08/30/2016   HDL 24 (L) 08/30/2016   LDLCALC 68 08/30/2016   TRIG 365 (H) 08/30/2016   CHOLHDL 6.9 (H) 08/30/2016    He has not been working on diet and exercise for prediabetes, and denies hyperglycemia, hypoglycemia , increased appetite, nausea, paresthesia of the feet, polydipsia and polyuria. Last A1C in the office was:  Lab Results  Component Value Date   HGBA1C 5.2 08/30/2016   Patient is on Vitamin D supplement and near goal:    Lab Results  Component Value Date   VD25OH 58 08/30/2016     Patient is on allopurinol for gout and does not report a recent flare.  Lab Results  Component Value Date   LABURIC 6.8 08/30/2016   He has a history  of testosterone deficiency and is on testosterone replacement. He states that the testosterone helps with his energy, libido, muscle mass. Lab Results  Component Value Date   TESTOSTERONE 622 08/30/2016     Current Medications:  Current Outpatient Prescriptions on File Prior to Visit  Medication Sig  . allopurinol  (ZYLOPRIM) 300 MG tablet Take 1 tablet (300 mg total) by mouth daily.  Marland Kitchen. atenolol (TENORMIN) 100 MG tablet TAKE 1 TABLET (100 MG TOTAL) BY MOUTH DAILY.  Marland Kitchen. B-D 3CC LUER-LOK SYR 21GX1" 21G X 1" 3 ML MISC USE WITH DEPO-TESTOSTERONE INJECTION  . Cholecalciferol (VITAMIN D) 2000 UNITS tablet Take 2,000 Units by mouth 3 (three) times daily.   . clonazePAM (KLONOPIN) 1 MG tablet Take 1 mg by mouth 2 (two) times daily as needed. Takes up to 2 tabs daily prn  . diclofenac sodium (VOLTAREN) 1 % GEL APPLY 4 GRAMS TOPICALLY FOUR TIMES A DAY  . DULoxetine (CYMBALTA) 60 MG capsule Take 60 mg by mouth 2 (two) times daily.  . enalapril (VASOTEC) 20 MG tablet TAKE 1 TABLET EVERY DAY FOR BLOOD PRESSURE  . gabapentin (NEURONTIN) 800 MG tablet TAKE 6 TABLETS BY MOUTH DAILY OR AS DIRECTED FOR PAIN  . meloxicam (MOBIC) 15 MG tablet TAKE 1 TABLET BY MOUTH EVERY DAY AS NEEDED FOR PAIN/INFLAMMATION  . methylphenidate (METADATE CD) 20 MG CR capsule   . mirtazapine (REMERON) 45 MG tablet Take 45 mg by mouth at bedtime.  Marland Kitchen. NEEDLE, DISP, 21 G (YALE DISP NEEDLES 21GX1") 21G X 1" MISC Use with Depo-Testosterone injection  . Omega-3 Fatty Acids (FISH OIL) 1000 MG CAPS Take 1,000 capsules by mouth.  Marland Kitchen. PROAIR HFA 108 (90 Base) MCG/ACT inhaler INHALE 2 PUFFS INTO THE LUNGS EVERY 6 HOURS AS NEEDED FOR WHEEZING OR SHORTNESS OF BREATH  . tadalafil (CIALIS) 20 MG tablet Take 1 tablet (20 mg total) by mouth daily as needed for erectile dysfunction.  Marland Kitchen. testosterone cypionate (DEPOTESTOSTERONE CYPIONATE) 200 MG/ML injection INJECT 2 CC IM EVERY 2 WEEKS, AS DIRECTED  . REXULTI 2 MG TABS    No current facility-administered medications on file prior to visit.      Allergies:  Allergies  Allergen Reactions  . Serzone [Nefazodone]     Leg cramps  . Trazamine [Trazodone & Diet Manage Prod] Other (See Comments)    confusion     Medical History:  Past Medical History:  Diagnosis Date  . ADD (attention deficit disorder)   . Anemia    . Depression   . Elevated hemoglobin A1c   . Elevated hemoglobin A1c   . Hyperlipidemia   . Hypertension   . Vitamin D deficiency    Family history- Reviewed and unchanged Social history- Reviewed and unchanged   Review of Systems:  Review of Systems  Constitutional: Negative.  Negative for malaise/fatigue.  HENT: Negative for congestion, hearing loss, sore throat and tinnitus.   Eyes: Negative for blurred vision.  Respiratory: Negative for cough, shortness of breath and wheezing.   Cardiovascular: Negative for chest pain, palpitations, claudication and leg swelling.  Gastrointestinal: Negative for abdominal pain, blood in stool, constipation, diarrhea, heartburn, melena, nausea and vomiting.  Genitourinary: Negative.   Musculoskeletal: Negative for joint pain and myalgias.  Skin: Negative.  Negative for rash.  Neurological: Negative for dizziness, tingling, tremors, sensory change, weakness and headaches.  Endo/Heme/Allergies: Negative.   Psychiatric/Behavioral: Positive for depression. Negative for substance abuse and suicidal ideas. The patient is not nervous/anxious and does not have insomnia.  Physical Exam: BP (!) 144/94   Pulse (!) 51   Temp (!) 97.5 F (36.4 C)   Ht 5' 9.25" (1.759 m)   Wt 189 lb (85.7 kg)   SpO2 97%   BMI 27.71 kg/m  Wt Readings from Last 3 Encounters:  12/26/16 189 lb (85.7 kg)  08/30/16 173 lb 6.4 oz (78.7 kg)  05/18/16 162 lb 6.4 oz (73.7 kg)   General Appearance: Well nourished, in no apparent distress. Eyes: PERRLA, EOMs, conjunctiva no swelling or erythema Sinuses: No Frontal/maxillary tenderness ENT/Mouth: Ext aud canals clear, TMs without erythema, bulging. No erythema, swelling, or exudate on post pharynx.  Tonsils not swollen or erythematous. Hearing normal.  Neck: Supple, thyroid normal.  Respiratory: Respiratory effort normal, BS equal bilaterally without rales, rhonchi, wheezing or stridor.  Cardio: RRR with no MRGs. Brisk  peripheral pulses without edema.  Abdomen: Soft, + BS.  Non tender, no guarding, rebound, hernias, masses. Lymphatics: Non tender without lymphadenopathy.  Musculoskeletal: Full ROM, 5/5 strength, Normal gait Skin: Warm, dry without rashes, lesions, ecchymosis.  Neuro: Cranial nerves intact. No cerebellar symptoms.  Psych: Awake and oriented X 3, normal affect, Insight and Judgment appropriate.    Dan Maker, NP 9:21 AM [facility]

## 2016-12-26 ENCOUNTER — Encounter: Payer: Self-pay | Admitting: Adult Health

## 2016-12-26 ENCOUNTER — Ambulatory Visit (INDEPENDENT_AMBULATORY_CARE_PROVIDER_SITE_OTHER): Payer: BC Managed Care – PPO | Admitting: Adult Health

## 2016-12-26 ENCOUNTER — Ambulatory Visit: Payer: Self-pay | Admitting: Physician Assistant

## 2016-12-26 VITALS — BP 144/94 | HR 51 | Temp 97.5°F | Ht 69.25 in | Wt 189.0 lb

## 2016-12-26 DIAGNOSIS — E782 Mixed hyperlipidemia: Secondary | ICD-10-CM | POA: Diagnosis not present

## 2016-12-26 DIAGNOSIS — I10 Essential (primary) hypertension: Secondary | ICD-10-CM

## 2016-12-26 DIAGNOSIS — E291 Testicular hypofunction: Secondary | ICD-10-CM

## 2016-12-26 DIAGNOSIS — F319 Bipolar disorder, unspecified: Secondary | ICD-10-CM

## 2016-12-26 DIAGNOSIS — Z79899 Other long term (current) drug therapy: Secondary | ICD-10-CM | POA: Diagnosis not present

## 2016-12-26 DIAGNOSIS — Z6825 Body mass index (BMI) 25.0-25.9, adult: Secondary | ICD-10-CM | POA: Diagnosis not present

## 2016-12-26 DIAGNOSIS — D580 Hereditary spherocytosis: Secondary | ICD-10-CM

## 2016-12-26 DIAGNOSIS — M109 Gout, unspecified: Secondary | ICD-10-CM | POA: Diagnosis not present

## 2016-12-26 DIAGNOSIS — F3130 Bipolar disorder, current episode depressed, mild or moderate severity, unspecified: Secondary | ICD-10-CM

## 2016-12-26 DIAGNOSIS — E559 Vitamin D deficiency, unspecified: Secondary | ICD-10-CM | POA: Diagnosis not present

## 2016-12-26 DIAGNOSIS — R7309 Other abnormal glucose: Secondary | ICD-10-CM

## 2016-12-26 DIAGNOSIS — M1000 Idiopathic gout, unspecified site: Secondary | ICD-10-CM | POA: Diagnosis not present

## 2016-12-26 NOTE — Addendum Note (Signed)
Addended by: Dan MakerORBETT, Evrett Hakim C on: 12/26/2016 12:51 PM   Modules accepted: Orders

## 2016-12-26 NOTE — Patient Instructions (Addendum)
I would like for you to work on stress reduction- taking time throughout the day when you feel yourself becoming tense or worked up to mentally wind down, start exercising (moderate walking 30 min a day is good for a lot of people).   Let's aim to get your weight back under 170 lb   Here is some information to help you keep your heart healthy: Move it! - Aim for 30 mins of activity every day. Take it slowly at first. Talk to us before starting any new exercise program.   Lose it.  -Body Mass Index (BMI) can indicate if you need to lose weight. A healthy range is 18.5-24.9. For a BMI calculator, go to [employer]  Waist Management -Excess abdominal fat is a risk factor for heart disease, diabetes, asthma, stroke and more. Ideal waist circumference is less than 35" for women and less than 40" for men.   Eat Right -focus on fruits, vegetables, whole grains, and meals you make yourself. Avoid foods with trans fat and high sugar/sodium content.   Snooze or Snore? - Loud snoring can be a sign of sleep apnea, a significant risk factor for high blood pressure, heart attach, stroke, and heart arrhythmias.  Kick the habit -Quit Smoking! Avoid second hand smoke. A single cigarette raises your blood pressure for 20 mins and increases the risk of heart attack and stroke for the next 24 hours.   Are Aspirin and Supplements right for you? -Add ENTERIC COATED low dose 81 mg Aspirin daily OR can do every other day if you have easy bruising to protect your heart and head. As well as to reduce risk of Colon Cancer by 20 %, Skin Cancer by 26 % , Melanoma by 46% and Pancreatic cancer by 60%  Say "No to Stress -There may be little you can do about problems that cause stress. However, techniques such as long walks, meditation, and exercise can help you manage it.   Start Now! - Make changes one at a time and set reasonable goals to increase your likelihood of success.

## 2016-12-27 ENCOUNTER — Other Ambulatory Visit: Payer: Self-pay | Admitting: Adult Health

## 2016-12-27 DIAGNOSIS — D649 Anemia, unspecified: Secondary | ICD-10-CM

## 2016-12-27 LAB — CBC WITH DIFFERENTIAL/PLATELET
Basophils Absolute: 26 cells/uL (ref 0–200)
Basophils Relative: 0.3 %
EOS PCT: 1.3 %
Eosinophils Absolute: 114 cells/uL (ref 15–500)
HCT: 36.4 % — ABNORMAL LOW (ref 38.5–50.0)
Hemoglobin: 10.9 g/dL — ABNORMAL LOW (ref 13.2–17.1)
Lymphs Abs: 1883 cells/uL (ref 850–3900)
MCH: 18.5 pg — ABNORMAL LOW (ref 27.0–33.0)
MCHC: 29.9 g/dL — ABNORMAL LOW (ref 32.0–36.0)
MCV: 61.8 fL — ABNORMAL LOW (ref 80.0–100.0)
MONOS PCT: 6.3 %
Neutro Abs: 6222 cells/uL (ref 1500–7800)
Neutrophils Relative %: 70.7 %
PLATELETS: 219 10*3/uL (ref 140–400)
RBC: 5.89 10*6/uL — AB (ref 4.20–5.80)
RDW: 21.4 % — ABNORMAL HIGH (ref 11.0–15.0)
Total Lymphocyte: 21.4 %
WBC mixed population: 554 cells/uL (ref 200–950)
WBC: 8.8 10*3/uL (ref 3.8–10.8)

## 2016-12-27 LAB — TSH: TSH: 2.82 m[IU]/L (ref 0.40–4.50)

## 2016-12-27 LAB — HEPATIC FUNCTION PANEL
AG RATIO: 2.2 (calc) (ref 1.0–2.5)
ALKALINE PHOSPHATASE (APISO): 70 U/L (ref 40–115)
ALT: 30 U/L (ref 9–46)
AST: 42 U/L — ABNORMAL HIGH (ref 10–35)
Albumin: 4.2 g/dL (ref 3.6–5.1)
BILIRUBIN DIRECT: 0.2 mg/dL (ref 0.0–0.2)
BILIRUBIN INDIRECT: 1 mg/dL (ref 0.2–1.2)
Globulin: 1.9 g/dL (calc) (ref 1.9–3.7)
TOTAL PROTEIN: 6.1 g/dL (ref 6.1–8.1)
Total Bilirubin: 1.2 mg/dL (ref 0.2–1.2)

## 2016-12-27 LAB — LIPID PANEL
CHOLESTEROL: 141 mg/dL (ref <200)
HDL: 29 mg/dL — AB (ref >40)
LDL Cholesterol (Calc): 81 mg/dL (calc)
Non-HDL Cholesterol (Calc): 112 mg/dL (calc) (ref <130)
TRIGLYCERIDES: 214 mg/dL — AB (ref <150)
Total CHOL/HDL Ratio: 4.9 (calc) (ref <5.0)

## 2016-12-27 LAB — TEST AUTHORIZATION

## 2016-12-27 LAB — BASIC METABOLIC PANEL WITH GFR
BUN: 15 mg/dL (ref 7–25)
CALCIUM: 9.4 mg/dL (ref 8.6–10.3)
CHLORIDE: 101 mmol/L (ref 98–110)
CO2: 33 mmol/L — ABNORMAL HIGH (ref 20–32)
Creat: 1.06 mg/dL (ref 0.70–1.25)
GFR, Est African American: 88 mL/min/{1.73_m2} (ref >=60)
GFR, Est Non African American: 76 mL/min/{1.73_m2} (ref >=60)
GLUCOSE: 80 mg/dL (ref 65–99)
Potassium: 4.2 mmol/L (ref 3.5–5.3)
Sodium: 140 mmol/L (ref 135–146)

## 2016-12-27 LAB — RETICULOCYTES
ABS Retic: 129800 cells/uL — ABNORMAL HIGH (ref 25000–9000)
RETIC CT PCT: 2.2 %

## 2016-12-27 LAB — IRON, TOTAL/TOTAL IRON BINDING CAP
%SAT: 16 % (calc) (ref 15–60)
Iron: 70 ug/dL (ref 50–180)
TIBC: 439 mcg/dL (calc) — ABNORMAL HIGH (ref 250–425)

## 2016-12-27 LAB — TESTOSTERONE: TESTOSTERONE: 718 ng/dL (ref 250–827)

## 2016-12-27 LAB — URIC ACID: Uric Acid, Serum: 6.9 mg/dL (ref 4.0–8.0)

## 2016-12-27 LAB — MAGNESIUM: Magnesium: 1.8 mg/dL (ref 1.5–2.5)

## 2016-12-27 LAB — FERRITIN: Ferritin: 17 ng/mL — ABNORMAL LOW (ref 20–380)

## 2016-12-27 LAB — HEMOGLOBIN A1C
EAG (MMOL/L): 5.5 (calc)
HEMOGLOBIN A1C: 5.1 %{Hb} (ref <5.7)
MEAN PLASMA GLUCOSE: 100 (calc)

## 2016-12-27 LAB — VITAMIN D 25 HYDROXY (VIT D DEFICIENCY, FRACTURES): VIT D 25 HYDROXY: 62 ng/mL (ref 30–100)

## 2016-12-28 ENCOUNTER — Other Ambulatory Visit: Payer: Self-pay | Admitting: Adult Health

## 2016-12-28 ENCOUNTER — Encounter: Payer: Self-pay | Admitting: Adult Health

## 2016-12-28 DIAGNOSIS — M1990 Unspecified osteoarthritis, unspecified site: Secondary | ICD-10-CM

## 2016-12-28 MED ORDER — DICLOFENAC SODIUM 1 % TD GEL: TRANSDERMAL | 3 refills | Status: DC

## 2017-01-08 ENCOUNTER — Other Ambulatory Visit: Payer: Self-pay | Admitting: Internal Medicine

## 2017-01-09 ENCOUNTER — Ambulatory Visit: Payer: BC Managed Care – PPO

## 2017-01-09 VITALS — BP 132/76 | Ht 69.25 in

## 2017-01-09 DIAGNOSIS — Z0130 Encounter for examination of blood pressure without abnormal findings: Secondary | ICD-10-CM

## 2017-01-09 NOTE — Progress Notes (Signed)
Pt presents for B/P check & to pick up a hemoccult card. Blood was checked & pt stated that this was much better today. Pt was handed his check out slip & he went to check out.

## 2017-01-19 ENCOUNTER — Other Ambulatory Visit: Payer: Self-pay | Admitting: Internal Medicine

## 2017-02-02 ENCOUNTER — Other Ambulatory Visit: Payer: Self-pay | Admitting: Internal Medicine

## 2017-02-06 ENCOUNTER — Other Ambulatory Visit: Payer: Self-pay | Admitting: Internal Medicine

## 2017-02-15 ENCOUNTER — Other Ambulatory Visit: Payer: Self-pay | Admitting: *Deleted

## 2017-02-15 DIAGNOSIS — D649 Anemia, unspecified: Secondary | ICD-10-CM

## 2017-02-15 DIAGNOSIS — Z1212 Encounter for screening for malignant neoplasm of rectum: Secondary | ICD-10-CM | POA: Diagnosis not present

## 2017-02-15 LAB — POC HEMOCCULT BLD/STL (HOME/3-CARD/SCREEN)
Card #2 Fecal Occult Blod, POC: NEGATIVE
FECAL OCCULT BLD: NEGATIVE
FECAL OCCULT BLD: NEGATIVE

## 2017-02-27 ENCOUNTER — Other Ambulatory Visit: Payer: Self-pay | Admitting: Internal Medicine

## 2017-02-27 DIAGNOSIS — E349 Endocrine disorder, unspecified: Secondary | ICD-10-CM

## 2017-03-28 ENCOUNTER — Ambulatory Visit: Payer: Self-pay | Admitting: Internal Medicine

## 2017-03-29 ENCOUNTER — Other Ambulatory Visit: Payer: Self-pay | Admitting: Internal Medicine

## 2017-03-29 DIAGNOSIS — E349 Endocrine disorder, unspecified: Secondary | ICD-10-CM

## 2017-03-31 ENCOUNTER — Other Ambulatory Visit: Payer: Self-pay | Admitting: Adult Health

## 2017-03-31 DIAGNOSIS — M1990 Unspecified osteoarthritis, unspecified site: Secondary | ICD-10-CM

## 2017-04-19 ENCOUNTER — Ambulatory Visit: Payer: BC Managed Care – PPO | Admitting: Internal Medicine

## 2017-04-19 VITALS — BP 128/76 | HR 56 | Temp 97.5°F | Resp 16 | Ht 69.25 in | Wt 191.4 lb

## 2017-04-19 DIAGNOSIS — I10 Essential (primary) hypertension: Secondary | ICD-10-CM | POA: Diagnosis not present

## 2017-04-19 DIAGNOSIS — E559 Vitamin D deficiency, unspecified: Secondary | ICD-10-CM | POA: Diagnosis not present

## 2017-04-19 DIAGNOSIS — E782 Mixed hyperlipidemia: Secondary | ICD-10-CM

## 2017-04-19 DIAGNOSIS — Z79899 Other long term (current) drug therapy: Secondary | ICD-10-CM | POA: Diagnosis not present

## 2017-04-19 DIAGNOSIS — M1000 Idiopathic gout, unspecified site: Secondary | ICD-10-CM | POA: Diagnosis not present

## 2017-04-19 DIAGNOSIS — R7309 Other abnormal glucose: Secondary | ICD-10-CM | POA: Diagnosis not present

## 2017-04-19 DIAGNOSIS — E291 Testicular hypofunction: Secondary | ICD-10-CM

## 2017-04-19 NOTE — Patient Instructions (Signed)

## 2017-04-19 NOTE — Progress Notes (Signed)
This very nice 62 y.o.  MWMpresents for 6 month follow up with Hypertension, Hyperlipidemia, Pre-Diabetes and Vitamin D Deficiency. Patient has  hx/o bipolar depression  & is followed at [name] by [name] who manages his psychotropic meds including Metadate . He also has hx/o Gout controlled on meds.     Patient is treated for HTN (1998) & BP has been controlled at home. Today's BP is at goal - 128/76. Patient has had no complaints of any cardiac type chest pain, palpitations, dyspnea / orthopnea / PND, dizziness, claudication, or dependent edema.     Hyperlipidemia is controlled with diet & meds. Patient denies myalgias or other med SE's. Last Lipids were at goal albeit elevated Trig's: Lab Results  Component Value Date   CHOL 141 12/26/2016   HDL 29 (L) 12/26/2016   LDLCALC 68 08/30/2016   TRIG 214 (H) 12/26/2016   CHOLHDL 4.9 12/26/2016      Also, the patient has history of PreDiabetes (A1c 5.8% / 2010) and has had no symptoms of reactive hypoglycemia, diabetic polys, paresthesias or visual blurring.  Last A1c was Normal & at goal: Lab Results  Component Value Date   HGBA1C 5.1 12/26/2016      Patient is on Depo-Testosterone for Low T with reported improvement in stamina, mood and sense of well-being.      Further, the patient also has history of Vitamin D Deficiency ("31" / 2008) and supplements vitamin D without any suspected side-effects. Last vitamin D was at goal:  Lab Results  Component Value Date   VD25OH 62 12/26/2016   Current Outpatient Medications on File Prior to Visit  Medication Sig  . allopurinol (ZYLOPRIM) 300 MG tablet Take 1 tablet (300 mg total) by mouth daily.  Marland Kitchen atenolol (TENORMIN) 100 MG tablet TAKE 1 TABLET BY MOUTH EVERY DAY  . B-D 3CC LUER-LOK SYR 21GX1" 21G X 1" 3 ML MISC USE WITH DEPO-TESTOSTERONE INJECTION  . Cholecalciferol (VITAMIN D) 2000 UNITS tablet Take 2,000 Units by mouth 3 (three) times daily.   . clonazePAM  (KLONOPIN) 1 MG tablet Take 1 mg by mouth 2 (two) times daily as needed. Takes up to 2 tabs daily prn  . diclofenac sodium (VOLTAREN) 1 % GEL APPLY 4GRAMS TOPICALLY FOUR TIMES A DAY  . DULoxetine (CYMBALTA) 60 MG capsule Take 60 mg by mouth 2 (two) times daily.  . enalapril (VASOTEC) 20 MG tablet TAKE 1 TABLET EVERY DAY FOR BLOOD PRESSURE  . gabapentin (NEURONTIN) 800 MG tablet TAKE 6 TABLETS BY MOUTH DAILY OR AS DIRECTED FOR PAIN  . meloxicam (MOBIC) 15 MG tablet TAKE 1 TABLET BY MOUTH EVERY DAY AS NEEDED FOR PAIN/INFLAMMATION  . methylphenidate (METADATE CD) 20 MG CR capsule   . mirtazapine (REMERON) 45 MG tablet Take 45 mg by mouth at bedtime.  Marland Kitchen NEEDLE, DISP, 21 G (YALE DISP NEEDLES 21GX1") 21G X 1" MISC Use with Depo-Testosterone injection  . Omega-3 Fatty Acids (FISH OIL) 1000 MG CAPS Take 1,000 capsules by mouth.  Marland Kitchen PROAIR HFA 108 (90 Base) MCG/ACT inhaler INHALE 2 PUFFS INTO THE LUNGS EVERY 6 HOURS AS NEEDED FOR WHEEZING OR SHORTNESS OF BREATH  . risperiDONE (RISPERDAL) 2 MG tablet Take 2 mg by mouth at bedtime.  . tadalafil (CIALIS) 20 MG tablet Take 1 tablet (20 mg total) by mouth daily as needed for erectile dysfunction.  Marland Kitchen testosterone cypionate (DEPOTESTOSTERONE CYPIONATE) 200 MG/ML injection INJECT 2 ML INTRAMUSCULARLY EVERY 2 WEEKS AS DIRECTED  No current facility-administered medications on file prior to visit.    Allergies  Allergen Reactions  . Serzone [Nefazodone]     Leg cramps  . Trazamine [Trazodone & Diet Manage Prod] Other (See Comments)    confusion   PMHx:   Past Medical History:  Diagnosis Date  . ADD (attention deficit disorder)   . Anemia   . Depression   . Elevated hemoglobin A1c   . Elevated hemoglobin A1c   . Hyperlipidemia   . Hypertension   . Vitamin D deficiency    Immunization History  Administered Date(s) Administered  . Influenza-Unspecified 12/06/2015, 12/07/2016  . PPD Test 07/23/2013, 07/24/2014, 08/09/2015, 08/30/2016  .  Pneumococcal-Unspecified 03/06/2009  . Td 03/24/2012   Past Surgical History:  Procedure Laterality Date  . FRACTURE SURGERY    . ORTHOPEDIC SURGERY Left    left foot pinning  . ORTHOPEDIC SURGERY Left    left foot pinning  . ORTHOPEDIC SURGERY Right    right elbow bursectomy  1984  . ORTHOPEDIC SURGERY Right 2002 dr supple   right rotator cuff   FHx:    Reviewed / unchanged  SHx:    Reviewed / unchanged  Systems Review:  Constitutional: Denies fever, chills, wt changes, headaches, insomnia, fatigue, night sweats, change in appetite. Eyes: Denies redness, blurred vision, diplopia, discharge, itchy, watery eyes.  ENT: Denies discharge, congestion, post nasal drip, epistaxis, sore throat, earache, hearing loss, dental pain, tinnitus, vertigo, sinus pain, snoring.  CV: Denies chest pain, palpitations, irregular heartbeat, syncope, dyspnea, diaphoresis, orthopnea, PND, claudication or edema. Respiratory: denies cough, dyspnea, DOE, pleurisy, hoarseness, laryngitis, wheezing.  Gastrointestinal: Denies dysphagia, odynophagia, heartburn, reflux, water brash, abdominal pain or cramps, nausea, vomiting, bloating, diarrhea, constipation, hematemesis, melena, hematochezia  or hemorrhoids. Genitourinary: Denies dysuria, frequency, urgency, nocturia, hesitancy, discharge, hematuria or flank pain. Musculoskeletal: Denies arthralgias, myalgias, stiffness, jt. swelling, pain, limping or strain/sprain.  Skin: Denies pruritus, rash, hives, warts, acne, eczema or change in skin lesion(s). Neuro: No weakness, tremor, incoordination, spasms, paresthesia or pain. Psychiatric: Denies confusion, memory loss or sensory loss. Endo: Denies change in weight, skin or hair change.  Heme/Lymph: No excessive bleeding, bruising or enlarged lymph nodes.  Physical Exam  BP 128/76   Pulse (!) 56   Temp (!) 97.5 F (36.4 C)   Resp 16   Ht 5' 9.25" (1.759 m)   Wt 191 lb 6.4 oz (86.8 kg)   BMI 28.06 kg/m    Appears well nourished, well groomed  and in no distress.  Eyes: PERRLA, EOMs, conjunctiva no swelling or erythema. Sinuses: No frontal/maxillary tenderness ENT/Mouth: EAC's clear, TM's nl w/o erythema, bulging. Nares clear w/o erythema, swelling, exudates. Oropharynx clear without erythema or exudates. Oral hygiene is good. Tongue normal, non obstructing. Hearing intact.  Neck: Supple. Thyroid not palpable. Car 2+/2+ without bruits, nodes or JVD. Chest: Respirations nl with BS clear & equal w/o rales, rhonchi, wheezing or stridor.  Cor: Heart sounds normal w/ regular rate and rhythm without sig. murmurs, gallops, clicks or rubs. Peripheral pulses normal and equal  without edema.  Abdomen: Soft & bowel sounds normal. Non-tender w/o guarding, rebound, hernias, masses or organomegaly.  Lymphatics: Unremarkable.  Musculoskeletal: Full ROM all peripheral extremities, joint stability, 5/5 strength and normal gait.  Skin: Warm, dry without exposed rashes, lesions or ecchymosis apparent.  Neuro: Cranial nerves intact, reflexes equal bilaterally. Sensory-motor testing grossly intact. Tendon reflexes grossly intact.  Pysch: Alert & oriented x 3.  Insight and judgement nl & appropriate. No  ideations.  Assessment and Plan:  1. Essential hypertension  - Continue medication, monitor blood pressure at home.  - Continue DASH diet. Reminder to go to the ER if any CP,  SOB, nausea, dizziness, severe HA, changes vision/speech.  - CBC with Differential/Platelet - BASIC METABOLIC PANEL WITH GFR - Magnesium - TSH  2. Hyperlipidemia, mixed  - Continue diet/meds, exercise,& lifestyle modifications.  - Continue monitor periodic cholesterol/liver & renal functions   - Hepatic function panel - Lipid panel - TSH  3. PreDiabetes  - Continue diet, exercise, lifestyle modifications.  - Monitor appropriate labs.  - Hemoglobin A1c - Insulin, random  4. Vitamin D deficiency  - Continue  supplementation.  - VITAMIN D 25 Hydroxy  5. Testosterone Deficiency  - Testosterone  6. Idiopathic gout  - Uric acid  7. Medication management  - CBC with Differential/Platelet - BASIC METABOLIC PANEL WITH GFR - Hepatic function panel - Magnesium - Lipid panel - TSH - Hemoglobin A1c - Insulin, random - VITAMIN D 25 Hydroxyl - Testosterone - Uric acid       Discussed  regular exercise, BP monitoring, weight control to achieve/maintain BMI less than 25 and discussed med and SE's. Recommended labs to assess and monitor clinical status with further disposition pending results of labs. Over 30 minutes of exam, counseling, chart review was performed.

## 2017-04-20 ENCOUNTER — Encounter: Payer: Self-pay | Admitting: Internal Medicine

## 2017-04-20 LAB — BASIC METABOLIC PANEL WITH GFR
BUN: 10 mg/dL (ref 7–25)
CALCIUM: 9.5 mg/dL (ref 8.6–10.3)
CO2: 32 mmol/L (ref 20–32)
CREATININE: 1.01 mg/dL (ref 0.70–1.25)
Chloride: 99 mmol/L (ref 98–110)
GFR, Est African American: 93 mL/min/{1.73_m2} (ref >=60)
GFR, Est Non African American: 80 mL/min/{1.73_m2} (ref >=60)
GLUCOSE: 76 mg/dL (ref 65–99)
Potassium: 4 mmol/L (ref 3.5–5.3)
SODIUM: 139 mmol/L (ref 135–146)

## 2017-04-20 LAB — CBC WITH DIFFERENTIAL/PLATELET
Basophils Absolute: 53 cells/uL (ref 0–200)
Basophils Relative: 0.6 %
EOS PCT: 1.8 %
Eosinophils Absolute: 158 cells/uL (ref 15–500)
HCT: 41.1 % (ref 38.5–50.0)
Hemoglobin: 12.2 g/dL — ABNORMAL LOW (ref 13.2–17.1)
Lymphs Abs: 1698 cells/uL (ref 850–3900)
MCH: 18.1 pg — ABNORMAL LOW (ref 27.0–33.0)
MCHC: 29.7 g/dL — AB (ref 32.0–36.0)
MCV: 61 fL — ABNORMAL LOW (ref 80.0–100.0)
Monocytes Relative: 5.9 %
NEUTROS PCT: 72.4 %
Neutro Abs: 6371 cells/uL (ref 1500–7800)
PLATELETS: 185 10*3/uL (ref 140–400)
RBC: 6.74 10*6/uL — AB (ref 4.20–5.80)
RDW: 21.3 % — AB (ref 11.0–15.0)
TOTAL LYMPHOCYTE: 19.3 %
WBC mixed population: 519 cells/uL (ref 200–950)
WBC: 8.8 10*3/uL (ref 3.8–10.8)

## 2017-04-20 LAB — HEPATIC FUNCTION PANEL
AG RATIO: 2.1 (calc) (ref 1.0–2.5)
ALBUMIN MSPROF: 4.4 g/dL (ref 3.6–5.1)
ALKALINE PHOSPHATASE (APISO): 68 U/L (ref 40–115)
ALT: 32 U/L (ref 9–46)
AST: 37 U/L — ABNORMAL HIGH (ref 10–35)
Bilirubin, Direct: 0.2 mg/dL (ref 0.0–0.2)
GLOBULIN: 2.1 g/dL (ref 1.9–3.7)
Indirect Bilirubin: 0.9 mg/dL (calc) (ref 0.2–1.2)
TOTAL PROTEIN: 6.5 g/dL (ref 6.1–8.1)
Total Bilirubin: 1.1 mg/dL (ref 0.2–1.2)

## 2017-04-20 LAB — HEMOGLOBIN A1C
Hgb A1c MFr Bld: 5.4 % of total Hgb (ref <5.7)
MEAN PLASMA GLUCOSE: 108 (calc)
eAG (mmol/L): 6 (calc)

## 2017-04-20 LAB — LIPID PANEL
CHOL/HDL RATIO: 5.4 (calc) — AB (ref <5.0)
CHOLESTEROL: 147 mg/dL (ref <200)
HDL: 27 mg/dL — AB (ref >40)
LDL Cholesterol (Calc): 81 mg/dL (calc)
Non-HDL Cholesterol (Calc): 120 mg/dL (calc) (ref <130)
Triglycerides: 321 mg/dL — ABNORMAL HIGH (ref <150)

## 2017-04-20 LAB — INSULIN, RANDOM: Insulin: 12.4 u[IU]/mL (ref 2.0–19.6)

## 2017-04-20 LAB — VITAMIN D 25 HYDROXY (VIT D DEFICIENCY, FRACTURES): VIT D 25 HYDROXY: 54 ng/mL (ref 30–100)

## 2017-04-20 LAB — TESTOSTERONE: TESTOSTERONE: 1214 ng/dL — AB (ref 250–827)

## 2017-04-20 LAB — TSH: TSH: 2.02 mIU/L (ref 0.40–4.50)

## 2017-04-20 LAB — MAGNESIUM: Magnesium: 1.9 mg/dL (ref 1.5–2.5)

## 2017-04-20 LAB — CBC MORPHOLOGY

## 2017-04-20 LAB — URIC ACID: URIC ACID, SERUM: 6 mg/dL (ref 4.0–8.0)

## 2017-05-04 ENCOUNTER — Other Ambulatory Visit: Payer: Self-pay | Admitting: Internal Medicine

## 2017-07-03 ENCOUNTER — Other Ambulatory Visit: Payer: Self-pay | Admitting: Physician Assistant

## 2017-07-15 NOTE — Progress Notes (Signed)
Assessment and Plan:  1. Essential hypertension - continue medications, DASH diet, exercise and monitor at home. Call if greater than 130/80.  - CBC with Differential/Platelet - BASIC METABOLIC PANEL WITH GFR - Hepatic function panel - TSH  2. Hyperlipidemia -continue medications, check lipids, decrease fatty foods, increase activity.  - Lipid panel  3. Medication management   Continue diet and meds as discussed. Further disposition pending results of labs. Future Appointments  Date Time Provider Department Center  09/14/2017  9:00 AM [name], MD [facility] None    HPI 62 y.o. male  presents for 3 month follow up with hypertension, hyperlipidemia, prediabetes and vitamin D.   He states last fall he had a patch taken off his finger on right side, has a place on his left 4th finger that is new.  His blood pressure has been controlled at home, today their BP is BP: 138/90(left arm) He does not workout, but has a very physical job. He denies chest pain, shortness of breath, dizziness.   BMI is Body mass index is 28.5 kg/m., he is working on diet and exercise. Wt Readings from Last 3 Encounters:  07/17/17 194 lb 6.4 oz (88.2 kg)  04/19/17 191 lb 6.4 oz (86.8 kg)  12/26/16 189 lb (85.7 kg)    He is not on cholesterol medication and denies myalgias. His cholesterol is at goal. The cholesterol last visit was:   Lab Results  Component Value Date   CHOL 147 04/19/2017   HDL 27 (L) 04/19/2017   LDLCALC 81 04/19/2017   TRIG 321 (H) 04/19/2017   CHOLHDL 5.4 (H) 04/19/2017   He is in the PreDM range, denies DM poly's Lab Results  Component Value Date   HGBA1C 5.4 04/19/2017   Patient is on Vitamin D supplement.   Lab Results  Component Value Date   VD25OH 74 04/19/2017     He is has a history of testosterone deficiency and is on testosterone replacement, he is over due for a shot, last one a few weeks ago, does 1 cc q week. He states that the testosterone helps with  his energy, libido, muscle mass. Lab Results  Component Value Date   TESTOSTERONE 1,214 (H) 04/19/2017   Patient is on allopurinol for gout and does not report a recent flare.  Patient is on an ADD medication and on depression medication, followed by Dr. Chauncy Lean.  Current Medications:  Current Outpatient Medications on File Prior to Visit  Medication Sig Dispense Refill  . allopurinol (ZYLOPRIM) 300 MG tablet Take 1 tablet (300 mg total) by mouth daily. 90 tablet 3  . atenolol (TENORMIN) 100 MG tablet TAKE 1 TABLET BY MOUTH EVERY DAY 90 tablet 1  . B-D 3CC LUER-LOK SYR 21GX1" 21G X 1" 3 ML MISC USE WITH DEPO-TESTOSTERONE INJECTION 100 each PRN  . Cholecalciferol (VITAMIN D) 2000 UNITS tablet Take 2,000 Units by mouth 3 (three) times daily.     . clonazePAM (KLONOPIN) 1 MG tablet Take 1 mg by mouth 2 (two) times daily as needed. Takes up to 2 tabs daily prn    . diclofenac sodium (VOLTAREN) 1 % GEL APPLY 4GRAMS TOPICALLY FOUR TIMES A DAY 100 g 3  . DULoxetine (CYMBALTA) 60 MG capsule Take 60 mg by mouth 2 (two) times daily.    . enalapril (VASOTEC) 20 MG tablet TAKE 1 TABLET EVERY DAY FOR BLOOD PRESSURE 90 tablet 1  . gabapentin (NEURONTIN) 800 MG tablet TAKE 6 TABLETS BY MOUTH DAILY OR AS DIRECTED  FOR PAIN 540 tablet 0  . meloxicam (MOBIC) 15 MG tablet TAKE 1 TABLET BY MOUTH EVERY DAY AS NEEDED FOR PAIN/INFLAMMATION 90 tablet 0  . methylphenidate (METADATE CD) 20 MG CR capsule     . mirtazapine (REMERON) 45 MG tablet Take 45 mg by mouth at bedtime.    Marland Kitchen NEEDLE, DISP, 21 G (YALE DISP NEEDLES 21GX1") 21G X 1" MISC Use with Depo-Testosterone injection 100 each 99  . Omega-3 Fatty Acids (FISH OIL) 1000 MG CAPS Take 1,000 capsules by mouth.    Marland Kitchen PROAIR HFA 108 (90 Base) MCG/ACT inhaler INHALE 2 PUFFS INTO THE LUNGS EVERY 6 HOURS AS NEEDED FOR WHEEZING OR SHORTNESS OF BREATH 8.5 each 0  . risperiDONE (RISPERDAL) 2 MG tablet Take 2 mg by mouth at bedtime.    . tadalafil (CIALIS) 20 MG tablet Take  1 tablet (20 mg total) by mouth daily as needed for erectile dysfunction. 30 tablet 2  . testosterone cypionate (DEPOTESTOSTERONE CYPIONATE) 200 MG/ML injection INJECT 2 ML INTRAMUSCULARLY EVERY 2 WEEKS AS DIRECTED 10 mL 3   No current facility-administered medications on file prior to visit.    Medical History:  Past Medical History:  Diagnosis Date  . ADD (attention deficit disorder)   . Anemia   . Depression   . Elevated hemoglobin A1c   . Elevated hemoglobin A1c   . Hyperlipidemia   . Hypertension   . Vitamin D deficiency    Allergies:  Allergies  Allergen Reactions  . Serzone [Nefazodone]     Leg cramps  . Trazamine [Trazodone & Diet Manage Prod] Other (See Comments)    confusion    Review of Systems  Constitutional: Negative.   HENT: Negative.   Eyes: Negative.   Respiratory: Negative.   Cardiovascular: Negative.   Gastrointestinal: Negative.   Genitourinary: Negative.   Musculoskeletal: Negative.   Skin: Negative.   Neurological: Negative.   Endo/Heme/Allergies: Negative.   Psychiatric/Behavioral: Negative.     Family history- Review and unchanged Social history- Review and unchanged Physical Exam: BP 138/90 Comment: left arm  Pulse (!) 56   Temp (!) 97.5 F (36.4 C)   Ht 5' 9.25" (1.759 m)   Wt 194 lb 6.4 oz (88.2 kg)   SpO2 99%   BMI 28.50 kg/m  Wt Readings from Last 3 Encounters:  07/17/17 194 lb 6.4 oz (88.2 kg)  04/19/17 191 lb 6.4 oz (86.8 kg)  12/26/16 189 lb (85.7 kg)   General Appearance: Well nourished, in no apparent distress. Eyes: PERRLA, EOMs, conjunctiva no swelling or erythema Sinuses: No Frontal/maxillary tenderness ENT/Mouth: Ext aud canals clear, TMs without erythema, bulging. No erythema, swelling, or exudate on post pharynx.  Tonsils not swollen or erythematous. Hearing normal.  Neck: Supple, thyroid normal.  Respiratory: Respiratory effort normal, BS equal bilaterally without rales, rhonchi, wheezing or stridor.  Cardio: RRR  with no MRGs. Brisk peripheral pulses without edema.  Abdomen: Soft, + BS.  Non tender, no guarding, rebound, hernias, masses. Lymphatics: Non tender without lymphadenopathy.  Musculoskeletal: Full ROM, 5/5 strength, normal gait.  Skin: 2-3 mm dark area on left lateral 4th digit likely vascular. Warm, dry without rashes, lesions, ecchymosis.  Neuro: Cranial nerves intact. Normal muscle tone, no cerebellar symptoms. Sensation intact.  Psych: Awake and oriented X 3, normal affect, Insight and Judgment appropriate.      Quentin Mulling 10:53 AM

## 2017-07-17 ENCOUNTER — Ambulatory Visit: Payer: BC Managed Care – PPO | Admitting: Physician Assistant

## 2017-07-17 ENCOUNTER — Encounter: Payer: Self-pay | Admitting: Physician Assistant

## 2017-07-17 VITALS — BP 138/90 | HR 56 | Temp 97.5°F | Ht 69.25 in | Wt 194.4 lb

## 2017-07-17 DIAGNOSIS — F900 Attention-deficit hyperactivity disorder, predominantly inattentive type: Secondary | ICD-10-CM

## 2017-07-17 DIAGNOSIS — E782 Mixed hyperlipidemia: Secondary | ICD-10-CM

## 2017-07-17 DIAGNOSIS — D580 Hereditary spherocytosis: Secondary | ICD-10-CM | POA: Diagnosis not present

## 2017-07-17 DIAGNOSIS — F319 Bipolar disorder, unspecified: Secondary | ICD-10-CM | POA: Diagnosis not present

## 2017-07-17 DIAGNOSIS — I10 Essential (primary) hypertension: Secondary | ICD-10-CM

## 2017-07-17 DIAGNOSIS — E291 Testicular hypofunction: Secondary | ICD-10-CM

## 2017-07-17 MED ORDER — "NEEDLE (DISP) 21G X 1"" MISC": 99 refills | Status: DC

## 2017-07-17 MED ORDER — SYRINGE/NEEDLE (DISP) 21G X 1" 3 ML MISC
99 refills | Status: DC
Start: 2017-07-17 — End: 2019-02-26

## 2017-07-17 NOTE — Patient Instructions (Addendum)
Can do zinc 40-50 mg a day Can try shake that is whey protein, almond milk, avocado oil, and spinach and strawberries in the morning  IF YOUR LEFT FINGER GETS WORSE OR NOT BETTER WE CAN REFER FOR REMOVAL- KEEP AN EYE ON IT  9 Ways to Naturally Increase Testosterone Levels  1.   Lose Weight If you're overweight, shedding the excess pounds may increase your testosterone levels, according to research presented at the Endocrine Society's 2012 meeting. Overweight men are more likely to have low testosterone levels to begin with, so this is an important trick to increase your body's testosterone production when you need it most.  2.   High-Intensity Exercise like Peak Fitness  Short intense exercise has a proven positive effect on increasing testosterone levels and preventing its decline. That's unlike aerobics or prolonged moderate exercise, which have shown to have negative or no effect on testosterone levels. Having a whey protein meal after exercise can further enhance the satiety/testosterone-boosting impact (hunger hormones cause the opposite effect on your testosterone and libido). Here's a summary of what a typical high-intensity Peak Fitness routine might look like: " Warm up for three minutes  " Exercise as hard and fast as you can for 30 seconds. You should feel like you couldn't possibly go on another few seconds  " Recover at a slow to moderate pace for 90 seconds  " Repeat the high intensity exercise and recovery 7 more times .  3.   Consume Plenty of Zinc The mineral zinc is important for testosterone production, and supplementing your diet for as little as six weeks has been shown to cause a marked improvement in testosterone among men with low levels.1 Likewise, research has shown that restricting dietary sources of zinc leads to a significant decrease in testosterone, while zinc supplementation increases it2 -- and even protects men from exercised-induced reductions in testosterone  levels.3 It's estimated that up to 45 percent of adults over the age of 60 may have lower than recommended zinc intakes; even when dietary supplements were added in, an estimated 20-25 percent of older adults still had inadequate zinc intakes, according to a [employer] and Nutrition Examination Survey.4 Your diet is the best source of zinc; along with protein-rich foods like meats and fish, other good dietary sources of zinc include raw milk, raw cheese, beans, and yogurt or kefir made from raw milk. It can be difficult to obtain enough dietary zinc if you're a vegetarian, and also for meat-eaters as well, largely because of conventional farming methods that rely heavily on chemical fertilizers and pesticides. These chemicals deplete the soil of nutrients ... nutrients like zinc that must be absorbed by plants in order to be passed on to you. In many cases, you may further deplete the nutrients in your food by the way you prepare it. For most food, cooking it will drastically reduce its levels of nutrients like zinc ... particularly over-cooking, which many people do. If you decide to use a zinc supplement, stick to a dosage of less than 40 mg a day, as this is the recommended adult upper limit. Taking too much zinc can interfere with your body's ability to absorb other minerals, especially copper, and may cause nausea as a side effect.  4.   Strength Training In addition to Peak Fitness, strength training is also known to boost testosterone levels, provided you are doing so intensely enough. When strength training to boost testosterone, you'll want to increase the weight and lower your  number of reps, and then focus on exercises that work a large number of muscles, such as dead lifts or squats.  You can "turbo-charge" your weight training by going slower. By slowing down your movement, you're actually turning it into a high-intensity exercise. Super Slow movement allows your muscle, at the microscopic  level, to access the maximum number of cross-bridges between the protein filaments that produce movement in the muscle.   5.   Optimize Your Vitamin D Levels Vitamin D, a steroid hormone, is essential for the healthy development of the nucleus of the sperm cell, and helps maintain semen quality and sperm count. Vitamin D also increases levels of testosterone, which may boost libido. In one study, overweight men who were given vitamin D supplements had a significant increase in testosterone levels after one year.5   6.   Reduce Stress When you're under a lot of stress, your body releases high levels of the stress hormone cortisol. This hormone actually blocks the effects of testosterone,6 presumably because, from a biological standpoint, testosterone-associated behaviors (mating, competing, aggression) may have lowered your chances of survival in an emergency (hence, the "fight or flight" response is dominant, courtesy of cortisol).  7.   Limit or Eliminate Sugar from Your Diet Testosterone levels decrease after you eat sugar, which is likely because the sugar leads to a high insulin level, another factor leading to low testosterone.7 Based on USDA estimates, the average American consumes 12 teaspoons of sugar a day, which equates to about TWO TONS of sugar during a lifetime.  8.   Eat Healthy Fats By healthy, this means not only mon- and polyunsaturated fats, like that found in avocadoes and nuts, but also saturated, as these are essential for building testosterone. Research shows that a diet with less than 40 percent of energy as fat (and that mainly from animal sources, i.e. saturated) lead to a decrease in testosterone levels.8 My personal diet is about 60-70 percent healthy fat, and other experts agree that the ideal diet includes somewhere between 50-70 percent fat.  It's important to understand that your body requires saturated fats from animal and vegetable sources (such as meat, dairy, certain  oils, and tropical plants like coconut) for optimal functioning, and if you neglect this important food group in favor of sugar, grains and other starchy carbs, your health and weight are almost guaranteed to suffer. Examples of healthy fats you can eat more of to give your testosterone levels a boost include: Olives and Olive oil  Coconuts and coconut oil Butter made from raw grass-fed organic milk Raw nuts, such as, almonds or pecans Organic pastured egg yolks Avocados Grass-fed meats Palm oil Unheated organic nut oils   9.   Boost Your Intake of Branch Chain Amino Acids (BCAA) from Foods Like Whey Protein Research suggests that BCAAs result in higher testosterone levels, particularly when taken along with resistance training.9 While BCAAs are available in supplement form, you'll find the highest concentrations of BCAAs like leucine in dairy products - especially quality cheeses and whey protein. Even when getting leucine from your natural food supply, it's often wasted or used as a building block instead of an anabolic agent. So to create the correct anabolic environment, you need to boost leucine consumption way beyond mere maintenance levels. That said, keep in mind that using leucine as a free form amino acid can be highly counterproductive as when free form amino acids are artificially administrated, they rapidly enter your circulation while disrupting insulin function, and  impairing your body's glycemic control. Food-based leucine is really the ideal form that can benefit your muscles without side effects.   Ask insurance and pharmacy about shingrix - new vaccine   Can go to [email or web address].cy for more information  Shingrix Vaccination  Two vaccines are licensed and recommended to prevent shingles in the [place] Zoster vaccine live (ZVL, Zostavax) has been in use since 2006. Recombinant zoster vaccine (RZV, Shingrix), has been in use since 2017  and is recommended by ACIP as the preferred shingles vaccine.  What Everyone Should Know about Shingles Vaccine (Shingrix) One of the Recommended Vaccines by Disease Shingles vaccination is the only way to protect against shingles and postherpetic neuralgia (PHN), the most common complication from shingles. CDC recommends that healthy adults 50 years and older get two doses of the shingles vaccine called Shingrix (recombinant zoster vaccine), separated by 2 to 6 months, to prevent shingles and the complications from the disease. Your doctor or pharmacist can give you Shingrix as a shot in your upper arm. Shingrix provides strong protection against shingles and PHN. Two doses of Shingrix is more than 90% effective at preventing shingles and PHN. Protection stays above 85% for at least the first four years after you get vaccinated. Shingrix is the preferred vaccine, over Zostavax (zoster vaccine live), a shingles vaccine in use since 2006. Zostavax may still be used to prevent shingles in healthy adults 60 years and older. For example, you could use Zostavax if a person is allergic to Shingrix, prefers Zostavax, or requests immediate vaccination and Shingrix is unavailable. Who Should Get Shingrix? Healthy adults 50 years and older should get two doses of Shingrix, separated by 2 to 6 months. You should get Shingrix even if in the past you . had shingles  . received Zostavax  . are not sure if you had chickenpox There is no maximum age for getting Shingrix. If you had shingles in the past, you can get Shingrix to help prevent future occurrences of the disease. There is no specific length of time that you need to wait after having shingles before you can receive Shingrix, but generally you should make sure the shingles rash has gone away before getting vaccinated. You can get Shingrix whether or not you remember having had chickenpox in the past. Studies show that more than 99% of Americans 40 years and  older have had chickenpox, even if they don't remember having the disease. Chickenpox and shingles are related because they are caused by the same virus (varicella zoster virus). After a person recovers from chickenpox, the virus stays dormant (inactive) in the body. It can reactivate years later and cause shingles. If you had Zostavax in the recent past, you should wait at least eight weeks before getting Shingrix. Talk to your healthcare provider to determine the best time to get Shingrix. Shingrix is available in [employer] and pharmacies. To find doctor's offices or pharmacies near you that offer the vaccine, visit HealthMap Vaccine FinderExternal. If you have questions about Shingrix, talk with your healthcare provider. Vaccine for Those 50 Years and Older  Shingrix reduces the risk of shingles and PHN by more than 90% in people 56 and older. CDC recommends the vaccine for healthy adults 50 and older.  Who Should Not Get Shingrix? You should not get Shingrix if you: . have ever had a severe allergic reaction to any component of the vaccine or after a dose of Shingrix  . tested negative for immunity to varicella zoster virus. If  you test negative, you should get chickenpox vaccine.  . currently have shingles  . currently are pregnant or breastfeeding. Women who are pregnant or breastfeeding should wait to get Shingrix.  Marland Kitchen receive specific antiviral drugs (acyclovir, famciclovir, or valacyclovir) 24 hours before vaccination (avoid use of these antiviral drugs for 14 days after vaccination)- zoster vaccine live only If you have a minor acute (starts suddenly) illness, such as a cold, you may get Shingrix. But if you have a moderate or severe acute illness, you should usually wait until you recover before getting the vaccine. This includes anyone with a temperature of 101.71F or higher. The side effects of the Shingrix are temporary, and usually last 2 to 3 days. While you may experience pain  for a few days after getting Shingrix, the pain will be less severe than having shingles and the complications from the disease. How Well Does Shingrix Work? Two doses of Shingrix provides strong protection against shingles and postherpetic neuralgia (PHN), the most common complication of shingles. . In adults 71 to 61 years old who got two doses, Shingrix was 97% effective in preventing shingles; among adults 70 years and older, Shingrix was 91% effective.  . In adults 28 to 61 years old who got two doses, Shingrix was 91% effective in preventing PHN; among adults 70 years and older, Shingrix was 89% effective. Shingrix protection remained high (more than 85%) in people 70 years and older throughout the four years following vaccination. Since your risk of shingles and PHN increases as you get older, it is important to have strong protection against shingles in your older years. Top of Page  What Are the Possible Side Effects of Shingrix? Studies show that Shingrix is safe. The vaccine helps your body create a strong defense against shingles. As a result, you are likely to have temporary side effects from getting the shots. The side effects may affect your ability to do normal daily activities for 2 to 3 days. Most people got a sore arm with mild or moderate pain after getting Shingrix, and some also had redness and swelling where they got the shot. Some people felt tired, had muscle pain, a headache, shivering, fever, stomach pain, or nausea. About 1 out of 6 people who got Shingrix experienced side effects that prevented them from doing regular activities. Symptoms went away on their own in about 2 to 3 days. Side effects were more common in younger people. You might have a reaction to the first or second dose of Shingrix, or both doses. If you experience side effects, you may choose to take over-the-counter pain medicine such as ibuprofen or acetaminophen. If you experience side effects from Shingrix,  you should report them to the Vaccine Adverse Event Reporting System (VAERS). Your doctor might file this report, or you can do it yourself through the VAERS websiteExternal, or by calling [phone]. If you have any questions about side effects from Shingrix, talk with your doctor. The shingles vaccine does not contain thimerosal (a preservative containing mercury). Top of Page  When Should I See a Doctor Because of the Side Effects I Experience From Shingrix? In clinical trials, Shingrix was not associated with serious adverse events. In fact, serious side effects from vaccines are extremely rare. For example, for every 1 million doses of a vaccine given, only one or two people may have a severe allergic reaction. Signs of an allergic reaction happen within minutes or hours after vaccination and include hives, swelling of the face and  throat, difficulty breathing, a fast heartbeat, dizziness, or weakness. If you experience these or any other life-threatening symptoms, see a doctor right away. Shingrix causes a strong response in your immune system, so it may produce short-term side effects more intense than you are used to from other vaccines. These side effects can be uncomfortable, but they are expected and usually go away on their own in 2 or 3 days. Top of Page  How Can I Pay For Shingrix? There are several ways shingles vaccine may be paid for: Medicare . Medicare Part D plans cover the shingles vaccine, but there may be a cost to you depending on your plan. There may be a copay for the vaccine, or you may need to pay in full then get reimbursed for a certain amount.  . Medicare Part B does not cover the shingles vaccine. Medicaid . Medicaid may or may not cover the vaccine. Contact your insurer to find out. Private health insurance . Many private health insurance plans will cover the vaccine, but there may be a cost to you depending on your plan. Contact your insurer to find out. Vaccine  assistance programs . Some pharmaceutical companies provide vaccines to eligible adults who cannot afford them. You may want to check with the vaccine manufacturer, [employer], about Shingrix. If you do not currently have health insurance, learn more about affordable health coverage optionsExternal. To find doctor's offices or pharmacies near you that offer the vaccine, visit HealthMap Vaccine FinderExternal.

## 2017-07-18 LAB — CBC WITH DIFFERENTIAL/PLATELET
BASOS PCT: 0.7 %
Basophils Absolute: 53 cells/uL (ref 0–200)
EOS ABS: 91 {cells}/uL (ref 15–500)
Eosinophils Relative: 1.2 %
HCT: 40.6 % (ref 38.5–50.0)
HEMOGLOBIN: 11.6 g/dL — AB (ref 13.2–17.1)
Lymphs Abs: 1847 cells/uL (ref 850–3900)
MCH: 16.7 pg — ABNORMAL LOW (ref 27.0–33.0)
MCHC: 28.6 g/dL — ABNORMAL LOW (ref 32.0–36.0)
MCV: 58.5 fL — ABNORMAL LOW (ref 80.0–100.0)
MONOS PCT: 5.9 %
NEUTROS ABS: 5160 {cells}/uL (ref 1500–7800)
Neutrophils Relative %: 67.9 %
PLATELETS: 195 10*3/uL (ref 140–400)
RBC: 6.94 10*6/uL — AB (ref 4.20–5.80)
RDW: 20.6 % — ABNORMAL HIGH (ref 11.0–15.0)
Total Lymphocyte: 24.3 %
WBC mixed population: 448 cells/uL (ref 200–950)
WBC: 7.6 10*3/uL (ref 3.8–10.8)

## 2017-07-18 LAB — COMPLETE METABOLIC PANEL WITH GFR
AG Ratio: 2.4 (calc) (ref 1.0–2.5)
ALBUMIN MSPROF: 4.4 g/dL (ref 3.6–5.1)
ALT: 28 U/L (ref 9–46)
AST: 35 U/L (ref 10–35)
Alkaline phosphatase (APISO): 69 U/L (ref 40–115)
BUN: 11 mg/dL (ref 7–25)
CALCIUM: 9.3 mg/dL (ref 8.6–10.3)
CO2: 31 mmol/L (ref 20–32)
CREATININE: 1.17 mg/dL (ref 0.70–1.25)
Chloride: 100 mmol/L (ref 98–110)
GFR, EST AFRICAN AMERICAN: 78 mL/min/{1.73_m2} (ref >=60)
GFR, EST NON AFRICAN AMERICAN: 67 mL/min/{1.73_m2} (ref >=60)
GLUCOSE: 84 mg/dL (ref 65–99)
Globulin: 1.8 g/dL (calc) — ABNORMAL LOW (ref 1.9–3.7)
POTASSIUM: 4.2 mmol/L (ref 3.5–5.3)
Sodium: 138 mmol/L (ref 135–146)
TOTAL PROTEIN: 6.2 g/dL (ref 6.1–8.1)
Total Bilirubin: 1 mg/dL (ref 0.2–1.2)

## 2017-07-18 LAB — LIPID PANEL
Cholesterol: 137 mg/dL (ref <200)
HDL: 27 mg/dL — AB (ref >40)
LDL CHOLESTEROL (CALC): 70 mg/dL
NON-HDL CHOLESTEROL (CALC): 110 mg/dL (ref <130)
Total CHOL/HDL Ratio: 5.1 (calc) — ABNORMAL HIGH (ref <5.0)
Triglycerides: 335 mg/dL — ABNORMAL HIGH (ref <150)

## 2017-07-18 LAB — TESTOSTERONE: TESTOSTERONE: 289 ng/dL (ref 250–827)

## 2017-07-18 LAB — TSH: TSH: 2.33 mIU/L (ref 0.40–4.50)

## 2017-07-18 LAB — CBC MORPHOLOGY

## 2017-07-27 ENCOUNTER — Other Ambulatory Visit: Payer: Self-pay | Admitting: Internal Medicine

## 2017-07-31 ENCOUNTER — Other Ambulatory Visit: Payer: Self-pay | Admitting: Internal Medicine

## 2017-09-14 ENCOUNTER — Encounter: Payer: Self-pay | Admitting: Internal Medicine

## 2017-10-09 ENCOUNTER — Other Ambulatory Visit: Payer: Self-pay | Admitting: Internal Medicine

## 2017-10-09 DIAGNOSIS — E349 Endocrine disorder, unspecified: Secondary | ICD-10-CM

## 2017-10-23 ENCOUNTER — Ambulatory Visit: Payer: BC Managed Care – PPO | Admitting: Internal Medicine

## 2017-10-23 ENCOUNTER — Encounter: Payer: Self-pay | Admitting: Internal Medicine

## 2017-10-23 VITALS — BP 136/86 | HR 52 | Temp 97.5°F | Resp 16 | Ht 68.75 in | Wt 194.0 lb

## 2017-10-23 DIAGNOSIS — N401 Enlarged prostate with lower urinary tract symptoms: Secondary | ICD-10-CM

## 2017-10-23 DIAGNOSIS — Z79899 Other long term (current) drug therapy: Secondary | ICD-10-CM | POA: Diagnosis not present

## 2017-10-23 DIAGNOSIS — E559 Vitamin D deficiency, unspecified: Secondary | ICD-10-CM

## 2017-10-23 DIAGNOSIS — Z130 Encounter for screening for diseases of the blood and blood-forming organs and certain disorders involving the immune mechanism: Secondary | ICD-10-CM | POA: Diagnosis not present

## 2017-10-23 DIAGNOSIS — Z1211 Encounter for screening for malignant neoplasm of colon: Secondary | ICD-10-CM

## 2017-10-23 DIAGNOSIS — Z1329 Encounter for screening for other suspected endocrine disorder: Secondary | ICD-10-CM | POA: Diagnosis not present

## 2017-10-23 DIAGNOSIS — I10 Essential (primary) hypertension: Secondary | ICD-10-CM

## 2017-10-23 DIAGNOSIS — Z1389 Encounter for screening for other disorder: Secondary | ICD-10-CM | POA: Diagnosis not present

## 2017-10-23 DIAGNOSIS — M1000 Idiopathic gout, unspecified site: Secondary | ICD-10-CM

## 2017-10-23 DIAGNOSIS — R5383 Other fatigue: Secondary | ICD-10-CM

## 2017-10-23 DIAGNOSIS — R7309 Other abnormal glucose: Secondary | ICD-10-CM

## 2017-10-23 DIAGNOSIS — Z136 Encounter for screening for cardiovascular disorders: Secondary | ICD-10-CM | POA: Diagnosis not present

## 2017-10-23 DIAGNOSIS — Z8249 Family history of ischemic heart disease and other diseases of the circulatory system: Secondary | ICD-10-CM

## 2017-10-23 DIAGNOSIS — Z0001 Encounter for general adult medical examination with abnormal findings: Secondary | ICD-10-CM

## 2017-10-23 DIAGNOSIS — Z131 Encounter for screening for diabetes mellitus: Secondary | ICD-10-CM | POA: Diagnosis not present

## 2017-10-23 DIAGNOSIS — Z0000 Encounter for general adult medical examination without abnormal findings: Secondary | ICD-10-CM | POA: Diagnosis not present

## 2017-10-23 DIAGNOSIS — Z125 Encounter for screening for malignant neoplasm of prostate: Secondary | ICD-10-CM | POA: Diagnosis not present

## 2017-10-23 DIAGNOSIS — Z111 Encounter for screening for respiratory tuberculosis: Secondary | ICD-10-CM | POA: Diagnosis not present

## 2017-10-23 DIAGNOSIS — R350 Frequency of micturition: Secondary | ICD-10-CM

## 2017-10-23 DIAGNOSIS — E782 Mixed hyperlipidemia: Secondary | ICD-10-CM

## 2017-10-23 DIAGNOSIS — Z13220 Encounter for screening for lipoid disorders: Secondary | ICD-10-CM | POA: Diagnosis not present

## 2017-10-23 DIAGNOSIS — E291 Testicular hypofunction: Secondary | ICD-10-CM

## 2017-10-23 DIAGNOSIS — Z1212 Encounter for screening for malignant neoplasm of rectum: Secondary | ICD-10-CM

## 2017-10-23 NOTE — Patient Instructions (Signed)

## 2017-10-23 NOTE — Progress Notes (Signed)
[name]   Kevin Gomez, M.D.     [name], [name] Judd GaudierAshley Corbett, DNP [facility]                [address]                [place], [place] [id number] Telephone [phone] Telefax [phone] Annual  Screening/Preventative Visit  & Comprehensive Evaluation & Examination     This very nice 62 y.o. [name] presents for a Screening /Preventative Visit & comprehensive evaluation and management of multiple medical co-morbidities.  Patient has been followed for HTN, HLD, Prediabetes and Vitamin D Deficiency.Patient is followed by Lindaann SloughLisa Poulas, FNP at [name]. Patient has hx Splenectomy for Hereditary Spherocytosis and has hx/o Gout controlled on meds.     HTN predates circa 1998. Patient's BP has been controlled at home.  Today's BP was initially sl elevated & rechecked at goal - 136/86. Marland Kitchen. Patient denies any cardiac symptoms as chest pain, palpitations, shortness of breath, dizziness or ankle swelling.     Patient's hyperlipidemia is controlled with diet and medications. Patient denies myalgias or other medication SE's. Last lipids were at goal albeit eelevated Trig's: Lab Results  Component Value Date   CHOL 137 07/17/2017   HDL 27 (L) 07/17/2017   LDLCALC 70 07/17/2017   TRIG 335 (H) 07/17/2017   CHOLHDL 5.1 (H) 07/17/2017      Patient has hx/o prediabetes (A1c 5.8%/2010)  and patient denies reactive hypoglycemic symptoms, visual blurring, diabetic polys or paresthesias. Last A1c was normal & at goal: Lab Results  Component Value Date   HGBA1C 5.4 04/19/2017       Patient has hx/o Low T and is on replacement by injection with improved stamina, libido and sense off well being.      Finally, patient has history of Vitamin D Deficiency ("31"/2008) and last vitamin D was near goal (70-100): Lab Results  Component Value Date   VD25OH 54 04/19/2017   Current Outpatient Medications on  File Prior to Visit  Medication Sig  . allopurinol (ZYLOPRIM) 300 MG tablet Take 1 tablet (300 mg total) by mouth daily.  Marland Kitchen. atenolol (TENORMIN) 100 MG tablet TAKE 1 TABLET BY MOUTH EVERY DAY  . Cholecalciferol (VITAMIN D) 2000 UNITS tablet Take 2,000 Units by mouth 4 (four) times daily.   . clonazePAM (KLONOPIN) 1 MG tablet Take 1 mg by mouth 2 (two) times daily as needed. Takes up to 2 tabs daily prn  . diclofenac sodium (VOLTAREN) 1 % GEL APPLY 4GRAMS TOPICALLY FOUR TIMES A DAY  . DULoxetine (CYMBALTA) 60 MG capsule Take 60 mg by mouth 2 (two) times daily.  . enalapril (VASOTEC) 20 MG tablet TAKE 1 TABLET EVERY DAY FOR BLOOD PRESSURE  . gabapentin (NEURONTIN) 800 MG tablet TAKE 6 TABLETS BY MOUTH DAILY OR AS DIRECTED FOR PAIN  . meloxicam (MOBIC) 15 MG tablet TAKE 1 TABLET BY MOUTH EVERY DAY AS NEEDED FOR PAIN/INFLAMMATION  . methylphenidate (METADATE CD) 20 MG CR capsule   . mirtazapine (REMERON) 45 MG tablet Take 45 mg by mouth at bedtime.  Marland Kitchen. NEEDLE, DISP, 21 G (YALE DISP NEEDLES 21GX1") 21G X 1" MISC Use with Depo-Testosterone injection  . Omega-3 Fatty Acids (FISH OIL) 1000 MG CAPS Take 1,000 capsules by mouth.  Marland Kitchen. PROAIR HFA 108 (90 Base) MCG/ACT inhaler INHALE 2 PUFFS INTO THE LUNGS EVERY 6 HOURS AS NEEDED FOR WHEEZING OR SHORTNESS OF BREATH  .  risperiDONE (RISPERDAL) 2 MG tablet Take 2 mg by mouth at bedtime.  . SYRINGE-NEEDLE, DISP, 3 ML (B-D 3CC LUER-LOK SYR 21GX1") 21G X 1" 3 ML MISC USE WITH DEPO-TESTOSTERONE INJECTION  . tadalafil (CIALIS) 20 MG tablet Take 1 tablet (20 mg total) by mouth daily as needed for erectile dysfunction.  Marland Kitchen testosterone cypionate (DEPOTESTOSTERONE CYPIONATE) 200 MG/ML injection INJECT 2 ML INTRAMUSCULARLY EVERY 2 WEEKS AS DIRECTED   No current facility-administered medications on file prior to visit.    Allergies  Allergen Reactions  . Serzone [Nefazodone]     Leg cramps  . Trazamine [Trazodone & Diet Manage Prod] Other (See Comments)    confusion    Past Medical History:  Diagnosis Date  . ADD (attention deficit disorder)   . Anemia   . Depression   . Elevated hemoglobin A1c   . Elevated hemoglobin A1c   . Hyperlipidemia   . Hypertension   . Vitamin D deficiency    Health Maintenance  Topic Date Due  . INFLUENZA VACCINE  10/04/2017  . COLONOSCOPY  08/18/2019  . TETANUS/TDAP  03/24/2022  . Hepatitis C Screening  Completed  . HIV Screening  Completed   Immunization History  Administered Date(s) Administered  . Influenza-Unspecified 12/06/2015, 12/07/2016  . PPD Test 07/23/2013, 07/24/2014, 08/09/2015, 08/30/2016  . Pneumococcal-Unspecified 03/06/2009  . Td 03/24/2012   Last Colon - 08/2009 Dr Reece Agar recc 10 yr f/u due June 2021  Past Surgical History:  Procedure Laterality Date  . FRACTURE SURGERY    . ORTHOPEDIC SURGERY Left    left foot pinning  . ORTHOPEDIC SURGERY Left    left foot pinning  . ORTHOPEDIC SURGERY Right    right elbow bursectomy  1984  . ORTHOPEDIC SURGERY Right 2002 dr supple   right rotator cuff   Family History  Problem Relation Age of Onset  . Cancer Mother        melonoma  . Hypertension Father   . Aortic stenosis Father    Social History   Socioeconomic History  . Marital status: Married    Spouse name: Amy  . Number of children:  2 adopted chinese daughters  . Highest education level: Post graduate  Occupational History  . 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 Smoking status: Never Smoker  . Smokeless tobacco: Never Used  Substance and Sexual Activity  . Alcohol use: No    Alcohol/week: 0.0 standard drinks  . Drug use: Not on file  . Sexual activity: Not on file    ROS Constitutional: Denies fever, chills, weight loss/gain, headaches, insomnia,  night sweats or change in appetite. Does c/o fatigue. Eyes: Denies redness, blurred vision, diplopia, discharge, itchy or watery eyes.  ENT: Denies discharge, congestion, post nasal drip, epistaxis,  sore throat, earache, hearing loss, dental pain, Tinnitus, Vertigo, Sinus pain or snoring.  Cardio: Denies chest pain, palpitations, irregular heartbeat, syncope, dyspnea, diaphoresis, orthopnea, PND, claudication or edema Respiratory: denies cough, dyspnea, DOE, pleurisy, hoarseness, laryngitis or wheezing.  Gastrointestinal: Denies dysphagia, heartburn, reflux, water brash, pain, cramps, nausea, vomiting, bloating, diarrhea, constipation, hematemesis, melena, hematochezia, jaundice or hemorrhoids Genitourinary: Denies dysuria, frequency, urgency, nocturia, hesitancy, discharge, hematuria or flank pain Musculoskeletal: Denies arthralgia, myalgia, stiffness, Jt. Swelling, pain, limp or strain/sprain. Denies Falls. Skin: Denies puritis, rash, hives, warts, acne, eczema or change in skin lesion Neuro: No weakness, tremor, incoordination, spasms, paresthesia or pain Psychiatric: Denies confusion, memory loss or sensory loss. Denies Depression. Endocrine: Denies  change in weight, skin, hair change, nocturia, and paresthesia, diabetic polys, visual blurring or hyper / hypo glycemic episodes.  Heme/Lymph: No excessive bleeding, bruising or enlarged lymph nodes.  Physical Exam  BP (!) 150/90   Pulse (!) 52   Temp (!) 97.5 F (36.4 C)   Resp 16   Ht 5' 8.75" (1.746 m)   Wt 194 lb (88 kg)   BMI 28.86 kg/m   General Appearance: Well nourished and well groomed and in no apparent distress.  Eyes: PERRLA, EOMs, conjunctiva no swelling or erythema, normal fundi and vessels. Sinuses: No frontal/maxillary tenderness ENT/Mouth: EACs patent / TMs  nl. Nares clear without erythema, swelling, mucoid exudates. Oral hygiene is good. No erythema, swelling, or exudate. Tongue normal, non-obstructing. Tonsils not swollen or erythematous. Hearing normal.  Neck: Supple, thyroid not palpable. No bruits, nodes or JVD. Respiratory: Respiratory effort normal.  BS equal and clear bilateral without rales, rhonci,  wheezing or stridor. Cardio: Heart sounds are normal with regular rate and rhythm and no murmurs, rubs or gallops. Peripheral pulses are normal and equal bilaterally without edema. No aortic or femoral bruits. Chest: symmetric with normal excursions and percussion.  Abdomen: Soft, with Nl bowel sounds. Nontender, no guarding, rebound, hernias, masses, or organomegaly.  Lymphatics: Non tender without lymphadenopathy.  Genitourinary: No hernias.Testes nl. DRE - prostate nl for age - smooth & firm w/o nodules. Musculoskeletal: Full ROM all peripheral extremities, joint stability, 5/5 strength, and normal gait. Skin: Warm and dry without rashes, lesions, cyanosis, clubbing or  ecchymosis.  Neuro: Cranial nerves intact, reflexes equal bilaterally. Normal muscle tone, no cerebellar symptoms. Sensation intact.  Pysch: Alert and oriented X 3 with normal affect, insight and judgment appropriate.   Assessment and Plan  1. Annual Preventative/Screening Exam   2. Essential hypertension  - Urinalysis, Routine w reflex microscopic - Microalbumin / creatinine urine ratio  - EKG 12-Lead - US, RETROPERITNL ABD,  LTD - CBC with Differential/Platelet - COMPLETE METABOLIC PANEL WITH GFR - Magnesium - TSH  3. Hyperlipidemia, mixed  - EKG 12-Lead - US, RETROPERITNL ABD,  LTD - Lipid panel - TSH  4. PreDiabetes  - EKG 12-Lead - US, RETROPERITNL ABD,  LTD - Hemoglobin A1c - Insulin, random  5. Vitamin D deficiency  - VITAMIN D 25 Hydroxyl  6. Idiopathic gout  - Uric acid  7. Testosterone Deficiency  - Testosterone  8. Screening examination for pulmonary tuberculosis  - PPD  9. Screening for colorectal cancer  - POC Hemoccult Bld/Stl   10. Prostate cancer screening  - PSA  11. Screening for ischemic heart disease  - EKG 12-Lead  12. FHx: heart disease  - EKG 12-Lead - US, RETROPERITNL ABD,  LTD  13. Screening for AAA (aortic abdominal aneurysm)  - US, RETROPERITNL  ABD,  LTD  14. Fatigue, unspecified type  - Iron,Total/Total Iron Binding Cap - Vitamin B12 - Testosterone - CBC with Differential/Platelet - TSH  15. Medication management  - Urinalysis, Routine w reflex microscopic - Microalbumin / creatinine urine ratio - Testosterone - Uric acid - CBC with Differential/Platelet - COMPLETE METABOLIC PANEL WITH GFR - Magnesium - Lipid panel - TSH - Hemoglobin A1c - Insulin, random - VITAMIN D 25 Hydroxyl       Patient was counseled in prudent diet, weight control to achieve/maintain BMI less than 25, BP monitoring, regular exercise and medications as discussed.  Discussed med effects and SE's. Routine screening labs and tests as requested with regular follow-up as recommended.  Over 40 minutes of exam, counseling, chart review and high complex critical decision making was performed

## 2017-10-24 LAB — COMPLETE METABOLIC PANEL WITH GFR
AG RATIO: 2.5 (calc) (ref 1.0–2.5)
ALKALINE PHOSPHATASE (APISO): 81 U/L (ref 40–115)
ALT: 31 U/L (ref 9–46)
AST: 33 U/L (ref 10–35)
Albumin: 4.2 g/dL (ref 3.6–5.1)
BILIRUBIN TOTAL: 0.8 mg/dL (ref 0.2–1.2)
BUN: 11 mg/dL (ref 7–25)
CO2: 30 mmol/L (ref 20–32)
Calcium: 8.7 mg/dL (ref 8.6–10.3)
Chloride: 103 mmol/L (ref 98–110)
Creat: 1.14 mg/dL (ref 0.70–1.25)
GFR, EST NON AFRICAN AMERICAN: 69 mL/min/{1.73_m2} (ref >=60)
GFR, Est African American: 80 mL/min/{1.73_m2} (ref >=60)
GLOBULIN: 1.7 g/dL — AB (ref 1.9–3.7)
Glucose, Bld: 93 mg/dL (ref 65–99)
Potassium: 4 mmol/L (ref 3.5–5.3)
SODIUM: 138 mmol/L (ref 135–146)
Total Protein: 5.9 g/dL — ABNORMAL LOW (ref 6.1–8.1)

## 2017-10-24 LAB — LIPID PANEL
CHOL/HDL RATIO: 4.4 (calc) (ref <5.0)
CHOLESTEROL: 120 mg/dL (ref <200)
HDL: 27 mg/dL — ABNORMAL LOW (ref >40)
LDL Cholesterol (Calc): 63 mg/dL (calc)
Non-HDL Cholesterol (Calc): 93 mg/dL (calc) (ref <130)
Triglycerides: 244 mg/dL — ABNORMAL HIGH (ref <150)

## 2017-10-24 LAB — INSULIN, RANDOM: Insulin: 11.9 u[IU]/mL (ref 2.0–19.6)

## 2017-10-24 LAB — CBC WITH DIFFERENTIAL/PLATELET
BASOS ABS: 41 {cells}/uL (ref 0–200)
Basophils Relative: 0.5 %
Eosinophils Absolute: 57 cells/uL (ref 15–500)
Eosinophils Relative: 0.7 %
HEMATOCRIT: 39.1 % (ref 38.5–50.0)
Hemoglobin: 10.9 g/dL — ABNORMAL LOW (ref 13.2–17.1)
LYMPHS ABS: 1507 {cells}/uL (ref 850–3900)
MCH: 15.9 pg — ABNORMAL LOW (ref 27.0–33.0)
MCHC: 27.9 g/dL — AB (ref 32.0–36.0)
MCV: 57.1 fL — AB (ref 80.0–100.0)
Monocytes Relative: 5.1 %
NEUTROS PCT: 75.1 %
Neutro Abs: 6083 cells/uL (ref 1500–7800)
PLATELETS: 227 10*3/uL (ref 140–400)
RBC: 6.85 10*6/uL — ABNORMAL HIGH (ref 4.20–5.80)
RDW: 23.7 % — AB (ref 11.0–15.0)
TOTAL LYMPHOCYTE: 18.6 %
WBC: 8.1 10*3/uL (ref 3.8–10.8)
WBCMIX: 413 {cells}/uL (ref 200–950)

## 2017-10-24 LAB — URINALYSIS, ROUTINE W REFLEX MICROSCOPIC
BILIRUBIN URINE: NEGATIVE
GLUCOSE, UA: NEGATIVE
HGB URINE DIPSTICK: NEGATIVE
Ketones, ur: NEGATIVE
LEUKOCYTES UA: NEGATIVE
Nitrite: NEGATIVE
PROTEIN: NEGATIVE
Specific Gravity, Urine: 1.008 (ref 1.001–1.03)
pH: 6 (ref 5.0–8.0)

## 2017-10-24 LAB — HEMOGLOBIN A1C
EAG (MMOL/L): 6.2 (calc)
HEMOGLOBIN A1C: 5.5 %{Hb} (ref <5.7)
MEAN PLASMA GLUCOSE: 111 (calc)

## 2017-10-24 LAB — MICROALBUMIN / CREATININE URINE RATIO
Creatinine, Urine: 54 mg/dL (ref 20–320)
MICROALB/CREAT RATIO: 4 ug/mg{creat} (ref <30)
Microalb, Ur: 0.2 mg/dL

## 2017-10-24 LAB — TESTOSTERONE: Testosterone: 1195 ng/dL — ABNORMAL HIGH (ref 250–827)

## 2017-10-24 LAB — IRON, TOTAL/TOTAL IRON BINDING CAP
%SAT: 9 % — AB (ref 20–48)
IRON: 38 ug/dL — AB (ref 50–180)
TIBC: 429 mcg/dL (calc) — ABNORMAL HIGH (ref 250–425)

## 2017-10-24 LAB — MAGNESIUM: MAGNESIUM: 1.9 mg/dL (ref 1.5–2.5)

## 2017-10-24 LAB — VITAMIN B12: VITAMIN B 12: 460 pg/mL (ref 200–1100)

## 2017-10-24 LAB — VITAMIN D 25 HYDROXY (VIT D DEFICIENCY, FRACTURES): VIT D 25 HYDROXY: 51 ng/mL (ref 30–100)

## 2017-10-24 LAB — URIC ACID: URIC ACID, SERUM: 5.7 mg/dL (ref 4.0–8.0)

## 2017-10-24 LAB — TSH: TSH: 1.55 mIU/L (ref 0.40–4.50)

## 2017-10-24 LAB — PSA: PSA: 0.6 ng/mL (ref <=4.0)

## 2017-10-24 LAB — CBC MORPHOLOGY

## 2017-10-28 ENCOUNTER — Other Ambulatory Visit: Payer: Self-pay | Admitting: Adult Health

## 2017-10-28 ENCOUNTER — Encounter: Payer: Self-pay | Admitting: Internal Medicine

## 2017-10-29 LAB — TB SKIN TEST
Induration: 0 mm
TB Skin Test: NEGATIVE

## 2017-11-15 ENCOUNTER — Telehealth: Payer: Self-pay | Admitting: *Deleted

## 2017-11-15 NOTE — Telephone Encounter (Signed)
Lab results faxed to [facility] at [phone].

## 2017-12-06 ENCOUNTER — Ambulatory Visit: Payer: BC Managed Care – PPO | Admitting: Internal Medicine

## 2017-12-06 VITALS — BP 136/100 | HR 56 | Temp 97.3°F | Resp 16 | Ht 68.75 in | Wt 193.4 lb

## 2017-12-06 DIAGNOSIS — Z23 Encounter for immunization: Secondary | ICD-10-CM | POA: Diagnosis not present

## 2017-12-06 DIAGNOSIS — S39012A Strain of muscle, fascia and tendon of lower back, initial encounter: Secondary | ICD-10-CM

## 2017-12-06 MED ORDER — CYCLOBENZAPRINE HCL 10 MG PO TABS: ORAL_TABLET | ORAL | 0 refills | Status: DC

## 2017-12-06 MED ORDER — PREDNISONE 20 MG PO TABS: ORAL_TABLET | ORAL | 0 refills | Status: DC

## 2017-12-09 ENCOUNTER — Encounter: Payer: Self-pay | Admitting: Internal Medicine

## 2017-12-09 NOTE — Progress Notes (Signed)
  Subjective:    Patient ID: Kevin Gomez, male    DOB: May 28, 1955, 62 y.o.   MRN: [id number]  HPI   This very nice 62 yo [name] presents with a 6 day prodrome of unprovoked LBP w/o sciatica. Describes pain as a dull ache in the low lumbar area aggravated by certain positions.   Outpatient Medications Prior to Visit  Medication Sig  . Allopurinol 300 MG tablet Take 1 tablet (300 mg total) by mouth daily.  Marland Kitchen atenolol  100 MG tablet TAKE 1 TABLET BY MOUTH EVERY DAY  . VITAMIN D 2000 UNITS tablet Take 2,000 Units by mouth 4 (four) times daily.   . clonazePAM  1 MG tablet Take 1 mg 2  times daily as needed. Takes up to 2 tabs daily   . DULoxetine  60 MG capsule Take 60 mg by mouth daily.   . enalapril  20 MG tablet TAKE 1 TABLET EVERY DAY FOR BLOOD PRESSURE  . gabapentin 800 MG tablet TAKE 6 TABLETS DAILY OR AS DIRECTED   . meloxicam 15 MG tablet TAKE 1 TABLET  EVERY DAY AS NEEDED   . METADATE CD 20 MG CR cap   . mirtazapine 45 MG tablet Take 45 mg by mouth at bedtime.  . Omega-3  FISH OIL)1000 MG CAPS Take 1,000 mg by mouth.   Marland Kitchen PROAIR HFA  inhaler 2 PUFFS  EVERY 6 HRS AS NEEDED   . risperiDONE  2 MG tablet Take 2 mg by mouth at bedtime.  . tadalafil (CIALIS) 20 MG tablet Take 1 tablet daily as needed  . testosterone cypio 200 MG/ML injec INJECT 2 ML IM EVERY 2 WEEKS   Allergies  Allergen Reactions  . Serzone [Nefazodone]     Leg cramps  . Trazamine [Trazodone & Diet Manage Prod] Other (See Comments)    confusion   Past Medical History:  Diagnosis Date  . ADD (attention deficit disorder)   . Anemia   . Depression   . Elevated hemoglobin A1c   . Elevated hemoglobin A1c   . Hyperlipidemia   . Hypertension   . Vitamin D deficiency    Review of Systems   10 point systems review negative except as above.    Objective:   Physical Exam  BP (!) 136/100   Pulse (!) 56   Temp (!) 97.3 F (36.3 C)   Resp 16   Ht 5' 8.75" (1.746 m)   Wt 193 lb 6.4 oz (87.7 kg)   BMI 28.77 kg/m    HEENT - WNL. Neck - supple.  Chest - Clear equal BS. Cor - Nl HS. RRR w/o sig MGR. PP 1(+). No edema. MS- FROM w/o deformities.  Gait Nl. Tender low paralumbar spasm. Negative SLR & Hip Fig of 4.  Neuro -  Nl w/o focal abnormalities.    Assessment & Plan:   1. Lumbar strain, initial encounter  - predniSONE 20 MG tablet; 1 tab 3 x day for 3 days, then 1 tab 2 x day for 3 days, then 1 tab 1 x day for 5 days  Dispense: 20 tablet  - cyclobenzaprine  10 MG tablet; Take 1/2 to 1 tablet    2 to 3 x /day for Muscle Spasm  Dispense: 90 tablet;  2. Need for prophylactic vaccination and inoculation against influenza  - FLU VACCINE MDCK QUAD W/Preservative  - discussed heat & stretching exercises

## 2017-12-09 NOTE — Patient Instructions (Signed)
Low Back Strain A strain is a stretch or tear in a muscle or the strong cords of tissue that attach muscle to bone (tendons). Strains of the lower back (lumbar spine) are a common cause of low back pain. A strain occurs when muscles or tendons are torn or are stretched beyond their limits. The muscles may become inflamed, resulting in pain and sudden muscle tightening (spasms). A strain can happen suddenly due to an injury (trauma), or it can develop gradually due to overuse. There are three types of strains:  Grade 1 is a mild strain involving a minor tear of the muscle fibers or tendons. This may cause some pain but no loss of muscle strength.  Grade 2 is a moderate strain involving a partial tear of the muscle fibers or tendons. This causes more severe pain and some loss of muscle strength.  Grade 3 is a severe strain involving a complete tear of the muscle or tendon. This causes severe pain and complete or nearly complete loss of muscle strength. What are the causes? This condition may be caused by:  Trauma, such as a fall or a hit to the body.  Twisting or overstretching the back. This may result from doing activities that require a lot of energy, such as lifting heavy objects. What increases the risk? The following factors may increase your risk of getting this condition:  Playing contact sports.  Participating in sports or activities that put excessive stress on the back and require a lot of bending and twisting, including:  Lifting weights or heavy objects.  Gymnastics.  Soccer.  Figure skating.  Snowboarding.  Being overweight or obese.  Having poor strength and flexibility. What are the signs or symptoms? Symptoms of this condition may include:  Sharp or dull pain in the lower back that does not go away. Pain may extend to the buttocks.  Stiffness.  Limited range of motion.  Inability to stand up straight due to stiffness or pain.  Muscle spasms. How is this  diagnosed?   This condition may be diagnosed based on:  Your symptoms.  Your medical history.  A physical exam.  Your health care provider may push on certain areas of your back to determine the source of your pain.  You may be asked to bend forward, backward, and side to side to assess the severity of your pain and your range of motion.  Imaging tests, such as:  X-rays.  MRI. How is this treated? Treatment for this condition may include:  Applying heat and cold to the affected area.  Medicines to help relieve pain and to relax your muscles (muscle relaxants).  NSAIDs to help reduce swelling and discomfort.  Physical therapy. When your symptoms improve, it is important to gradually return to your normal routine as soon as possible to reduce pain, avoid stiffness, and avoid loss of muscle strength. Generally, symptoms should improve within 6 weeks of treatment. However, recovery time varies. Follow these instructions at home: Managing pain, stiffness, and swelling  If directed, apply ice to the injured area during the first 24 hours after your injury.  Put ice in a plastic bag.  Place a towel between your skin and the bag.  Leave the ice on for 20 minutes, 2-3 times a day.  If directed, apply heat to the affected area as often as told by your health care provider. Use the heat source that your health care provider recommends, such as a moist heat pack or a heating pad.    Place a towel between your skin and the heat source.  Leave the heat on for 20-30 minutes.  Remove the heat if your skin turns bright red. This is especially important if you are unable to feel pain, heat, or cold. You may have a greater risk of getting burned. Activity  Rest and return to your normal activities as told by your health care provider. Ask your health care provider what activities are safe for you.  Avoid activities that take a lot of effort (are strenuous) for as long as told by your  health care provider.  Do exercises as told by your health care provider. General instructions  Take over-the-counter and prescription medicines only as told by your health care provider.  If you have questions or concerns about safety while taking pain medicine, talk with your health care provider.  Do not drive or operate heavy machinery until you know how your pain medicine affects you.  Do not use any tobacco products, such as cigarettes, chewing tobacco, and e-cigarettes. Tobacco can delay bone healing. If you need help quitting, ask your health care provider.  Keep all follow-up visits as told by your health care provider. This is important. How is this prevented?  Warm up and stretch before being active.  Cool down and stretch after being active.  Give your body time to rest between periods of activity.  Avoid:  Being physically inactive for long periods at a time.  Exercising or playing sports when you are tired or in pain.  Use correct form when playing sports and lifting heavy objects.  Use good posture when sitting and standing.  Maintain a healthy weight.  Sleep on a mattress with medium firmness to support your back.  Make sure to use equipment that fits you, including shoes that fit well.  Be safe and responsible while being active to avoid falls.  Do at least 150 minutes of moderate-intensity exercise each week, such as brisk walking or water aerobics. Try a form of exercise that takes stress off your back, such as swimming or stationary cycling.  Maintain physical fitness, including:  Strength.  Flexibility.  Cardiovascular fitness.  Endurance. Contact a health care provider if:  Your back pain does not improve after 6 weeks of treatment.  Your symptoms get worse. Get help right away if:  Your back pain is severe.  You are unable to stand or walk.  You develop pain in your legs.  You develop weakness in your buttocks or legs.  You have  difficulty controlling when you urinate or when you have a bowel movement. This information is not intended to replace advice given to you by your health care provider. Make sure you discuss any questions you have with your health care provider. Document Released: 02/20/2005 Document Revised: 10/28/2015 Document Reviewed: 12/02/2014 [employer] Interactive Patient Education  2017 [employer]  

## 2017-12-18 ENCOUNTER — Other Ambulatory Visit: Payer: Self-pay

## 2017-12-18 DIAGNOSIS — Z1212 Encounter for screening for malignant neoplasm of rectum: Principal | ICD-10-CM

## 2017-12-18 DIAGNOSIS — Z1211 Encounter for screening for malignant neoplasm of colon: Secondary | ICD-10-CM

## 2017-12-18 LAB — POC HEMOCCULT BLD/STL (HOME/3-CARD/SCREEN)
Card #2 Fecal Occult Blod, POC: NEGATIVE
Card #3 Fecal Occult Blood, POC: NEGATIVE
Fecal Occult Blood, POC: NEGATIVE

## 2017-12-24 ENCOUNTER — Other Ambulatory Visit: Payer: Self-pay | Admitting: Adult Health

## 2018-01-16 ENCOUNTER — Other Ambulatory Visit: Payer: Self-pay | Admitting: Adult Health

## 2018-01-16 DIAGNOSIS — E349 Endocrine disorder, unspecified: Secondary | ICD-10-CM

## 2018-01-22 ENCOUNTER — Other Ambulatory Visit: Payer: Self-pay | Admitting: Internal Medicine

## 2018-02-04 NOTE — Progress Notes (Signed)
FOLLOW UP  Assessment and Plan:   Hypertension The current medical regimen is effective;  continue present plan and medications. Monitor blood pressure at home; call if consistently greater than 130/80 Continue DASH diet.   Reminder to go to the ER if any CP, SOB, nausea, dizziness, severe HA, changes vision/speech, left arm numbness and tingling and jaw pain.  Cholesterol Not currently on medication; LDL at goal; lifestyle for trigs discussed, initiate fenofibrate if 300+ Continue diet and exercise.  Check lipid panel.   Other abnormal glucose  Recent A1Cs at goal Discussed diet/exercise, weight management  Defer A1C; check CMP  Obesity with co morbidities Long discussion about weight loss, diet, and exercise Discussed ideal weight for height (below 170) - he plans to work towards this goal Patient will work on restarting exercise Will follow up in 3 months  Vitamin D Def/ osteoporosis prevention Continue supplementation Check Vit D level  Hereditary spherocytosis CBC - monitor for anemia  Gout Continue medications Diet discussed Check uric acid as needed  Bipolar depression/ADD Is continuing to follow up with Dr. Azucena Fallen, appears to be doing well on current medications.  Encouraged to work on stress, mindfulness, start exercising, and possibly follow up with counseling.   Lumbar back pain Continue mobic, flexeril Declines imaging, PT/ortho referral at this time Back exercises provided   Continue diet and meds as discussed. Further disposition pending results of labs. Discussed med's effects and SE's.   Over 30 minutes of exam, counseling, chart review, and critical decision making was performed.   Future Appointments  Date Time Provider Department Center  05/09/2018  9:30 AM [name], MD [facility] None  11/26/2018  3:00 PM [name], MD [facility] None     ----------------------------------------------------------------------------------------------------------------------  HPI 62 y.o. male  presents for 3 month follow up on hypertension, cholesterol, prediabetes, weight, gout and vitamin D deficiency.   Today he reports ongoing mild lumbar discomfort; he reports he had mild ongoing lumbar discomfort for several years, recently strained in 12/2017 prednisone and flexeril were helpful, and he continues with muscle relaxer, and take meloxicam 15 mg daily which he has been taking for several years now. We discussed options at length today, he declines imaging, PT or ortho referral today.   Patient is followed for hx/o bipolar depression and ADD at [name] by [name]. He reports he was switched form rexulti to risperidone, with most recent episode depressive but is doing well. He is also prescribed methylphenidate 20 mg CR for add, has klonopin 1 mg PRN which he reports uses sparingly.   BMI is Body mass index is 28.11 kg/m., he has been working on diet, watching portions particularly in the evening, but has not been exercising, though current job is somewhat more active. He would like to get back into walking regularly, he plans to start do so prior to work.  Wt Readings from Last 3 Encounters:  02/07/18 189 lb (85.7 kg)  12/06/17 193 lb 6.4 oz (87.7 kg)  10/23/17 194 lb (88 kg)   His blood pressure has been controlled at home (130/85), today their BP is BP: 122/88  He was not been working out. He denies chest pain, shortness of breath, dizziness.   He is not on cholesterol medication, takes omega 3 supplement and denies myalgias. His LDL cholesterol is at goal. The cholesterol last visit was:   Lab Results  Component Value Date   CHOL 120 10/23/2017   HDL 27 (L) 10/23/2017   LDLCALC 63  10/23/2017   TRIG 244 (H) 10/23/2017   CHOLHDL 4.4 10/23/2017    He has been working on diet and exercise for glucose  management, and denies hyperglycemia, hypoglycemia , increased appetite, nausea, paresthesia of the feet, polydipsia and polyuria. Last A1C in the office was:  Lab Results  Component Value Date   HGBA1C 5.5 10/23/2017   Patient is on Vitamin D supplement, 8000 IU daily, and near goal:    Lab Results  Component Value Date   VD25OH 51 10/23/2017     Patient is on allopurinol for gout and does not report a recent flare.  Lab Results  Component Value Date   LABURIC 5.7 10/23/2017   He has a history of testosterone deficiency and is on testosterone replacement. Last injection was 4 days ago, Taking 1 cc weekly. He states that the testosterone helps with his energy, libido, muscle mass. Lab Results  Component Value Date   TESTOSTERONE 1,195 (H) 10/23/2017     Current Medications:  Current Outpatient Medications on File Prior to Visit  Medication Sig  . allopurinol (ZYLOPRIM) 300 MG tablet TAKE 1 TABLET BY MOUTH EVERY DAY  . atenolol (TENORMIN) 100 MG tablet TAKE 1 TABLET BY MOUTH EVERY DAY  . Cholecalciferol (VITAMIN D) 2000 UNITS tablet Take 2,000 Units by mouth 4 (four) times daily.   . clonazePAM (KLONOPIN) 1 MG tablet Take 1 mg by mouth 2 (two) times daily as needed. Takes up to 2 tabs daily prn  . cyclobenzaprine (FLEXERIL) 10 MG tablet Take 1/2 to 1 tablet    2 to 3 x /day for Muscle Spasm  . DULoxetine (CYMBALTA) 60 MG capsule Take 60 mg by mouth daily.   . enalapril (VASOTEC) 20 MG tablet TAKE 1 TABLET EVERY DAY FOR BLOOD PRESSURE  . gabapentin (NEURONTIN) 800 MG tablet TAKE 6 TABLETS BY MOUTH DAILY OR AS DIRECTED FOR PAIN  . meloxicam (MOBIC) 15 MG tablet TAKE 1 TABLET BY MOUTH EVERY DAY AS NEEDED FOR PAIN/INFLAMMATION  . methylphenidate (METADATE CD) 20 MG CR capsule   . mirtazapine (REMERON) 45 MG tablet Take 45 mg by mouth at bedtime.  Marland Kitchen NEEDLE, DISP, 21 G (YALE DISP NEEDLES 21GX1") 21G X 1" MISC Use with Depo-Testosterone injection  . Omega-3 Fatty Acids (FISH OIL) 1000 MG  CAPS Take 1,000 mg by mouth.   Marland Kitchen PROAIR HFA 108 (90 Base) MCG/ACT inhaler INHALE 2 PUFFS INTO THE LUNGS EVERY 6 HOURS AS NEEDED FOR WHEEZING OR SHORTNESS OF BREATH  . risperiDONE (RISPERDAL) 2 MG tablet Take 2 mg by mouth at bedtime.  . SYRINGE-NEEDLE, DISP, 3 ML (B-D 3CC LUER-LOK SYR 21GX1") 21G X 1" 3 ML MISC USE WITH DEPO-TESTOSTERONE INJECTION  . tadalafil (CIALIS) 20 MG tablet Take 1 tablet (20 mg total) by mouth daily as needed for erectile dysfunction.  Marland Kitchen testosterone cypionate (DEPOTESTOSTERONE CYPIONATE) 200 MG/ML injection INJECT 2 ML INTRAMUSCULARLY EVERY 2 WEEKS AS DIRECTED   No current facility-administered medications on file prior to visit.      Allergies:  Allergies  Allergen Reactions  . Serzone [Nefazodone]     Leg cramps  . Trazamine [Trazodone & Diet Manage Prod] Other (See Comments)    confusion     Medical History:  Past Medical History:  Diagnosis Date  . ADD (attention deficit disorder)   . Anemia   . Depression   . Elevated hemoglobin A1c   . Elevated hemoglobin A1c   . Hyperlipidemia   . Hypertension   . Vitamin D  deficiency    Family history- Reviewed and unchanged Social history- Reviewed and unchanged   Review of Systems:  Review of Systems  Constitutional: Negative.  Negative for malaise/fatigue.  HENT: Negative for congestion, hearing loss, sore throat and tinnitus.   Eyes: Negative for blurred vision.  Respiratory: Negative for cough, shortness of breath and wheezing.   Cardiovascular: Negative for chest pain, palpitations, claudication and leg swelling.  Gastrointestinal: Negative for abdominal pain, blood in stool, constipation, diarrhea, heartburn, melena, nausea and vomiting.  Genitourinary: Negative.   Musculoskeletal: Positive for back pain. Negative for joint pain and myalgias.  Skin: Negative.  Negative for rash.  Neurological: Negative for dizziness, tingling, tremors, sensory change, weakness and headaches.   Endo/Heme/Allergies: Negative.   Psychiatric/Behavioral: Positive for depression. Negative for substance abuse and suicidal ideas. The patient is not nervous/anxious and does not have insomnia.     Physical Exam: BP 122/88   Pulse (!) 54   Temp 97.9 F (36.6 C)   Ht 5' 8.75" (1.746 m)   Wt 189 lb (85.7 kg)   SpO2 98%   BMI 28.11 kg/m  Wt Readings from Last 3 Encounters:  02/07/18 189 lb (85.7 kg)  12/06/17 193 lb 6.4 oz (87.7 kg)  10/23/17 194 lb (88 kg)   General Appearance: Well nourished, in no apparent distress. Eyes: PERRLA, EOMs, conjunctiva no swelling or erythema Sinuses: No Frontal/maxillary tenderness ENT/Mouth: Ext aud canals clear, TMs without erythema, bulging. No erythema, swelling, or exudate on post pharynx.  Tonsils not swollen or erythematous. Hearing normal.  Neck: Supple, thyroid normal.  Respiratory: Respiratory effort normal, BS equal bilaterally without rales, rhonchi, wheezing or stridor.  Cardio: RRR with no MRGs. Brisk peripheral pulses without edema.  Abdomen: Soft, + BS.  Non tender, no guarding, rebound, hernias, masses. Lymphatics: Non tender without lymphadenopathy.  Musculoskeletal: Full ROM, 5/5 strength, Normal gait Skin: Warm, dry without rashes, lesions, ecchymosis.  Neuro: Cranial nerves intact. No cerebellar symptoms.  Psych: Awake and oriented X 3, normal affect, Insight and Judgment appropriate.    Dan MakerAshley C Malory Spurr, NP 8:52 AM [facility]

## 2018-02-07 ENCOUNTER — Ambulatory Visit: Payer: BC Managed Care – PPO | Admitting: Adult Health

## 2018-02-07 ENCOUNTER — Encounter: Payer: Self-pay | Admitting: Adult Health

## 2018-02-07 VITALS — BP 122/88 | HR 54 | Temp 97.9°F | Ht 68.75 in | Wt 189.0 lb

## 2018-02-07 DIAGNOSIS — Z79899 Other long term (current) drug therapy: Secondary | ICD-10-CM

## 2018-02-07 DIAGNOSIS — E559 Vitamin D deficiency, unspecified: Secondary | ICD-10-CM

## 2018-02-07 DIAGNOSIS — I10 Essential (primary) hypertension: Secondary | ICD-10-CM

## 2018-02-07 DIAGNOSIS — D580 Hereditary spherocytosis: Secondary | ICD-10-CM

## 2018-02-07 DIAGNOSIS — M545 Low back pain, unspecified: Secondary | ICD-10-CM | POA: Insufficient documentation

## 2018-02-07 DIAGNOSIS — E291 Testicular hypofunction: Secondary | ICD-10-CM | POA: Diagnosis not present

## 2018-02-07 DIAGNOSIS — Z6825 Body mass index (BMI) 25.0-25.9, adult: Secondary | ICD-10-CM

## 2018-02-07 DIAGNOSIS — M1990 Unspecified osteoarthritis, unspecified site: Secondary | ICD-10-CM

## 2018-02-07 DIAGNOSIS — R7309 Other abnormal glucose: Secondary | ICD-10-CM | POA: Diagnosis not present

## 2018-02-07 DIAGNOSIS — F900 Attention-deficit hyperactivity disorder, predominantly inattentive type: Secondary | ICD-10-CM

## 2018-02-07 DIAGNOSIS — E782 Mixed hyperlipidemia: Secondary | ICD-10-CM | POA: Diagnosis not present

## 2018-02-07 DIAGNOSIS — M1000 Idiopathic gout, unspecified site: Secondary | ICD-10-CM

## 2018-02-07 DIAGNOSIS — F319 Bipolar disorder, unspecified: Secondary | ICD-10-CM

## 2018-02-07 MED ORDER — DICLOFENAC SODIUM 1 % TD GEL: TRANSDERMAL | 3 refills | Status: DC

## 2018-02-07 NOTE — Patient Instructions (Signed)
Goals    . Exercise 150 min/wk Moderate Activity    . Weight (lb) < 170 lb (77.1 kg)       Consider adding tumeric + bioprene/black pepper fruit supplement    Back Exercises The following exercises strengthen the muscles that help to support the back. They also help to keep the lower back flexible. Doing these exercises can help to prevent back pain or lessen existing pain. If you have back pain or discomfort, try doing these exercises 2-3 times each day or as told by your health care provider. When the pain goes away, do them once each day, but increase the number of times that you repeat the steps for each exercise (do more repetitions). If you do not have back pain or discomfort, do these exercises once each day or as told by your health care provider. Exercises Single Knee to Chest  Repeat these steps 3-5 times for each leg: 1. Lie on your back on a firm bed or the floor with your legs extended. 2. Bring one knee to your chest. Your other leg should stay extended and in contact with the floor. 3. Hold your knee in place by grabbing your knee or thigh. 4. Pull on your knee until you feel a gentle stretch in your lower back. 5. Hold the stretch for 10-30 seconds. 6. Slowly release and straighten your leg.  Pelvic Tilt  Repeat these steps 5-10 times: 1. Lie on your back on a firm bed or the floor with your legs extended. 2. Bend your knees so they are pointing toward the ceiling and your feet are flat on the floor. 3. Tighten your lower abdominal muscles to press your lower back against the floor. This motion will tilt your pelvis so your tailbone points up toward the ceiling instead of pointing to your feet or the floor. 4. With gentle tension and even breathing, hold this position for 5-10 seconds.  Cat-Cow  Repeat these steps until your lower back becomes more flexible: 1. Get into a hands-and-knees position on a firm surface. Keep your hands under your shoulders, and keep your  knees under your hips. You may place padding under your knees for comfort. 2. Let your head hang down, and point your tailbone toward the floor so your lower back becomes rounded like the back of a cat. 3. Hold this position for 5 seconds. 4. Slowly lift your head and point your tailbone up toward the ceiling so your back forms a sagging arch like the back of a cow. 5. Hold this position for 5 seconds.  Press-Ups  Repeat these steps 5-10 times: 1. Lie on your abdomen (face-down) on the floor. 2. Place your palms near your head, about shoulder-width apart. 3. While you keep your back as relaxed as possible and keep your hips on the floor, slowly straighten your arms to raise the top half of your body and lift your shoulders. Do not use your back muscles to raise your upper torso. You may adjust the placement of your hands to make yourself more comfortable. 4. Hold this position for 5 seconds while you keep your back relaxed. 5. Slowly return to lying flat on the floor.  Bridges  Repeat these steps 10 times: 1. Lie on your back on a firm surface. 2. Bend your knees so they are pointing toward the ceiling and your feet are flat on the floor. 3. Tighten your buttocks muscles and lift your buttocks off of the floor until your waist  is at almost the same height as your knees. You should feel the muscles working in your buttocks and the back of your thighs. If you do not feel these muscles, slide your feet 1-2 inches farther away from your buttocks. 4. Hold this position for 3-5 seconds. 5. Slowly lower your hips to the starting position, and allow your buttocks muscles to relax completely.  If this exercise is too easy, try doing it with your arms crossed over your chest. Abdominal Crunches  Repeat these steps 5-10 times: 1. Lie on your back on a firm bed or the floor with your legs extended. 2. Bend your knees so they are pointing toward the ceiling and your feet are flat on the  floor. 3. Cross your arms over your chest. 4. Tip your chin slightly toward your chest without bending your neck. 5. Tighten your abdominal muscles and slowly raise your trunk (torso) high enough to lift your shoulder blades a tiny bit off of the floor. Avoid raising your torso higher than that, because it can put too much stress on your low back and it does not help to strengthen your abdominal muscles. 6. Slowly return to your starting position.  Back Lifts Repeat these steps 5-10 times: 1. Lie on your abdomen (face-down) with your arms at your sides, and rest your forehead on the floor. 2. Tighten the muscles in your legs and your buttocks. 3. Slowly lift your chest off of the floor while you keep your hips pressed to the floor. Keep the back of your head in line with the curve in your back. Your eyes should be looking at the floor. 4. Hold this position for 3-5 seconds. 5. Slowly return to your starting position.  Contact a health care provider if:  Your back pain or discomfort gets much worse when you do an exercise.  Your back pain or discomfort does not lessen within 2 hours after you exercise. If you have any of these problems, stop doing these exercises right away. Do not do them again unless your health care provider says that you can. Get help right away if:  You develop sudden, severe back pain. If this happens, stop doing the exercises right away. Do not do them again unless your health care provider says that you can. This information is not intended to replace advice given to you by your health care provider. Make sure you discuss any questions you have with your health care provider. Document Released: 03/30/2004 Document Revised: 06/30/2015 Document Reviewed: 04/16/2014 Elsevier Interactive Patient Education  2017 [employer].

## 2018-02-08 ENCOUNTER — Encounter: Payer: Self-pay | Admitting: Adult Health

## 2018-02-08 LAB — COMPLETE METABOLIC PANEL WITH GFR
AG Ratio: 2.2 (calc) (ref 1.0–2.5)
ALT: 31 U/L (ref 9–46)
AST: 42 U/L — AB (ref 10–35)
Albumin: 4.7 g/dL (ref 3.6–5.1)
Alkaline phosphatase (APISO): 75 U/L (ref 40–115)
BUN: 14 mg/dL (ref 7–25)
CO2: 31 mmol/L (ref 20–32)
CREATININE: 1.2 mg/dL (ref 0.70–1.25)
Calcium: 10.2 mg/dL (ref 8.6–10.3)
Chloride: 100 mmol/L (ref 98–110)
GFR, EST AFRICAN AMERICAN: 75 mL/min/{1.73_m2} (ref >=60)
GFR, EST NON AFRICAN AMERICAN: 65 mL/min/{1.73_m2} (ref >=60)
Globulin: 2.1 g/dL (calc) (ref 1.9–3.7)
Glucose, Bld: 79 mg/dL (ref 65–99)
POTASSIUM: 4.8 mmol/L (ref 3.5–5.3)
SODIUM: 138 mmol/L (ref 135–146)
TOTAL PROTEIN: 6.8 g/dL (ref 6.1–8.1)
Total Bilirubin: 1 mg/dL (ref 0.2–1.2)

## 2018-02-08 LAB — LIPID PANEL
CHOL/HDL RATIO: 5.1 (calc) — AB (ref <5.0)
Cholesterol: 147 mg/dL (ref <200)
HDL: 29 mg/dL — ABNORMAL LOW (ref >40)
LDL Cholesterol (Calc): 83 mg/dL (calc)
NON-HDL CHOLESTEROL (CALC): 118 mg/dL (ref <130)
Triglycerides: 266 mg/dL — ABNORMAL HIGH (ref <150)

## 2018-02-08 LAB — CBC WITH DIFFERENTIAL/PLATELET
BASOS PCT: 0.5 %
Basophils Absolute: 44 cells/uL (ref 0–200)
EOS ABS: 79 {cells}/uL (ref 15–500)
Eosinophils Relative: 0.9 %
HEMATOCRIT: 48.2 % (ref 38.5–50.0)
Hemoglobin: 14.1 g/dL (ref 13.2–17.1)
Lymphs Abs: 2464 cells/uL (ref 850–3900)
MCH: 18.1 pg — ABNORMAL LOW (ref 27.0–33.0)
MCHC: 29.3 g/dL — ABNORMAL LOW (ref 32.0–36.0)
MCV: 62 fL — AB (ref 80.0–100.0)
MONOS PCT: 5.4 %
NEUTROS ABS: 5738 {cells}/uL (ref 1500–7800)
Neutrophils Relative %: 65.2 %
PLATELETS: 227 10*3/uL (ref 140–400)
RBC: 7.78 10*6/uL — AB (ref 4.20–5.80)
RDW: 23.9 % — AB (ref 11.0–15.0)
TOTAL LYMPHOCYTE: 28 %
WBC: 8.8 10*3/uL (ref 3.8–10.8)
WBCMIX: 475 {cells}/uL (ref 200–950)

## 2018-02-08 LAB — TSH: TSH: 2.49 mIU/L (ref 0.40–4.50)

## 2018-02-08 LAB — VITAMIN D 25 HYDROXY (VIT D DEFICIENCY, FRACTURES): VIT D 25 HYDROXY: 61 ng/mL (ref 30–100)

## 2018-02-08 LAB — CBC MORPHOLOGY

## 2018-03-12 ENCOUNTER — Other Ambulatory Visit: Payer: Self-pay | Admitting: Internal Medicine

## 2018-03-17 ENCOUNTER — Other Ambulatory Visit: Payer: Self-pay | Admitting: Adult Health

## 2018-03-17 DIAGNOSIS — E349 Endocrine disorder, unspecified: Secondary | ICD-10-CM

## 2018-03-26 ENCOUNTER — Other Ambulatory Visit: Payer: Self-pay | Admitting: Internal Medicine

## 2018-03-26 DIAGNOSIS — S39012A Strain of muscle, fascia and tendon of lower back, initial encounter: Secondary | ICD-10-CM

## 2018-05-08 ENCOUNTER — Encounter: Payer: Self-pay | Admitting: Internal Medicine

## 2018-05-08 NOTE — Patient Instructions (Signed)

## 2018-05-08 NOTE — Progress Notes (Signed)
This very nice 63 y.o. [name] presents for 6 month follow up with HTN, HLD, Pre-Diabetes and Vitamin D Deficiency. Patient has hx/o Depression and is followed by [name], FNP at [name]. Patient also has  for Hereditary Spherocytosis (s/p Splenectomy) and has  Gout controlled on his meds.      Patient is treated for HTN (1998) & BP has been controlled at home. Today's BP is at goal - 116/82. Patient has had no complaints of any cardiac type chest pain, palpitations, dyspnea / orthopnea / PND, dizziness, claudication, or dependent edema.     Hyperlipidemia is controlled with diet & meds. Patient denies myalgias or other med SE's. Last Lipids were at goal albeit elevated Trigs's:  Lab Results  Component Value Date   CHOL 154 05/09/2018   HDL 26 (L) 05/09/2018   LDLCALC 92 05/09/2018   TRIG 273 (H) 05/09/2018   CHOLHDL 5.9 (H) 05/09/2018      Also, the patient has history of PreDiabetes  (A1c 5.8% / 2010) and has had no symptoms of reactive hypoglycemia, diabetic polys, paresthesias or visual blurring.  Last A1c was Normal & at goal: Lab Results  Component Value Date   HGBA1C 5.4 05/09/2018      Further, the patient also has history of Vitamin D Deficiency ("31" / 2008) and supplements vitamin D without any suspected side-effects. Last vitamin D was at goal: Lab Results  Component Value Date   VD25OH 73 05/09/2018   Current Outpatient Medications on File Prior to Visit  Medication Sig  . allopurinol (ZYLOPRIM) 300 MG tablet TAKE 1 TABLET BY MOUTH EVERY DAY  . atenolol (TENORMIN) 100 MG tablet TAKE 1 TABLET BY MOUTH EVERY DAY  . Cholecalciferol (VITAMIN D) 2000 UNITS tablet Take 2,000 Units by mouth 4 (four) times daily.   . clonazePAM (KLONOPIN) 1 MG tablet Take 1 mg by mouth 2 (two) times daily as needed. Takes up to 2 tabs daily prn  . cyclobenzaprine (FLEXERIL) 10 MG tablet TAKE 1/2 TO 1 TABLET 2 TO 3 TIMES DAILY FOR MUSCLE SPASM  . diclofenac sodium  (VOLTAREN) 1 % GEL APPLY 4GRAMS TOPICALLY FOUR TIMES A DAY  . DULoxetine (CYMBALTA) 60 MG capsule Take 60 mg by mouth daily.   . enalapril (VASOTEC) 20 MG tablet TAKE 1 TABLET EVERY DAY FOR BLOOD PRESSURE  . gabapentin (NEURONTIN) 800 MG tablet TAKE 6 TABLETS BY MOUTH DAILY OR AS DIRECTED FOR PAIN  . meloxicam (MOBIC) 15 MG tablet TAKE 1 TABLET BY MOUTH EVERY DAY AS NEEDED FOR PAIN/INFLAMMATION  . methylphenidate (METADATE CD) 20 MG CR capsule   . mirtazapine (REMERON) 45 MG tablet Take 45 mg by mouth at bedtime.  Marland Kitchen NEEDLE, DISP, 21 G (YALE DISP NEEDLES 21GX1") 21G X 1" MISC Use with Depo-Testosterone injection  . Omega-3 Fatty Acids (FISH OIL) 1000 MG CAPS Take 1,000 mg by mouth.   Marland Kitchen PROAIR HFA 108 (90 Base) MCG/ACT inhaler INHALE 2 PUFFS INTO THE LUNGS EVERY 6 HOURS AS NEEDED FOR WHEEZING OR SHORTNESS OF BREATH  . risperiDONE (RISPERDAL) 2 MG tablet Take 2 mg by mouth at bedtime.  . SYRINGE-NEEDLE, DISP, 3 ML (B-D 3CC LUER-LOK SYR 21GX1") 21G X 1" 3 ML MISC USE WITH DEPO-TESTOSTERONE INJECTION  . tadalafil (CIALIS) 20 MG tablet Take 1 tablet (20 mg total) by mouth daily as needed for erectile dysfunction.  Marland Kitchen testosterone cypionate (DEPOTESTOSTERONE CYPIONATE) 200 MG/ML injection INJECT 2 ML INTRAMUSCULARLY EVERY 2 WEEKS AS DIRECTED  No current facility-administered medications on file prior to visit.    Allergies  Allergen Reactions  . Serzone [Nefazodone]     Leg cramps  . Trazamine [Trazodone & Diet Manage Prod] Other (See Comments)    confusion   PMHx:   Past Medical History:  Diagnosis Date  . ADD (attention deficit disorder)   . Anemia   . Depression   . Elevated hemoglobin A1c   . Hyperlipidemia   . Hypertension   . Vitamin D deficiency    Immunization History  Administered Date(s) Administered  . Influenza Inj Mdck Quad With Preservative 12/06/2017  . Influenza-Unspecified 12/06/2015, 12/07/2016  . PPD Test 07/23/2013, 07/24/2014, 08/09/2015, 08/30/2016, 10/23/2017    . Pneumococcal-Unspecified 03/06/2009  . Td 03/24/2012   Past Surgical History:  Procedure Laterality Date  . FRACTURE SURGERY    . ORTHOPEDIC SURGERY Left    left foot pinning  . ORTHOPEDIC SURGERY Left    left foot pinning  . ORTHOPEDIC SURGERY Right    right elbow bursectomy  1984  . ORTHOPEDIC SURGERY Right 2002 dr supple   right rotator cuff   FHx:    Reviewed / unchanged  SHx:    Reviewed / unchanged   Systems Review:  Constitutional: Denies fever, chills, wt changes, headaches, insomnia, fatigue, night sweats, change in appetite. Eyes: Denies redness, blurred vision, diplopia, discharge, itchy, watery eyes.  ENT: Denies discharge, congestion, post nasal drip, epistaxis, sore throat, earache, hearing loss, dental pain, tinnitus, vertigo, sinus pain, snoring.  CV: Denies chest pain, palpitations, irregular heartbeat, syncope, dyspnea, diaphoresis, orthopnea, PND, claudication or edema. Respiratory: denies cough, dyspnea, DOE, pleurisy, hoarseness, laryngitis, wheezing.  Gastrointestinal: Denies dysphagia, odynophagia, heartburn, reflux, water brash, abdominal pain or cramps, nausea, vomiting, bloating, diarrhea, constipation, hematemesis, melena, hematochezia  or hemorrhoids. Genitourinary: Denies dysuria, frequency, urgency, nocturia, hesitancy, discharge, hematuria or flank pain. Musculoskeletal: Denies arthralgias, myalgias, stiffness, jt. swelling, pain, limping or strain/sprain.  Skin: Denies pruritus, rash, hives, warts, acne, eczema or change in skin lesion(s). Neuro: No weakness, tremor, incoordination, spasms, paresthesia or pain. Psychiatric: Denies confusion, memory loss or sensory loss. Endo: Denies change in weight, skin or hair change.  Heme/Lymph: No excessive bleeding, bruising or enlarged lymph nodes.  Physical Exam  BP 116/82   Pulse (!) 52   Temp (!) 97.3 F (36.3 C)   Resp 16   Ht 5' 8.75" (1.746 m)   Wt 185 lb 3.2 oz (84 kg)   BMI 27.55 kg/m    Appears  well nourished, well groomed  and in no distress.  Eyes: PERRLA, EOMs, conjunctiva no swelling or erythema. Sinuses: No frontal/maxillary tenderness ENT/Mouth: EAC's clear, TM's nl w/o erythema, bulging. Nares clear w/o erythema, swelling, exudates. Oropharynx clear without erythema or exudates. Oral hygiene is good. Tongue normal, non obstructing. Hearing intact.  Neck: Supple. Thyroid not palpable. Car 2+/2+ without bruits, nodes or JVD. Chest: Respirations nl with BS clear & equal w/o rales, rhonchi, wheezing or stridor.  Cor: Heart sounds normal w/ regular rate and rhythm without sig. murmurs, gallops, clicks or rubs. Peripheral pulses normal and equal  without edema.  Abdomen: Soft & bowel sounds normal. Non-tender w/o guarding, rebound, hernias, masses or organomegaly.  Lymphatics: Unremarkable.  Musculoskeletal: Full ROM all peripheral extremities, joint stability, 5/5 strength and normal gait.  Skin: Warm, dry without exposed rashes, lesions or ecchymosis apparent.  Neuro: Cranial nerves intact, reflexes equal bilaterally. Sensory-motor testing grossly intact. Tendon reflexes grossly intact.  Pysch: Alert & oriented x 3.  Insight and judgement nl & appropriate. No ideations.  Assessment and Plan:  1. Essential hypertension  - Continue medication, monitor blood pressure at home.  - Continue DASH diet.  Reminder to go to the ER if any CP,  SOB, nausea, dizziness, severe HA, changes vision/speech.  - CBC with Differential/Platelet - COMPLETE METABOLIC PANEL WITH GFR - Magnesium - TSH  2. Hyperlipidemia, mixed  - Continue diet/meds, exercise,& lifestyle modifications.  - Continue monitor periodic cholesterol/liver & renal functions   - Lipid panel - TSH  3. Abnormal glucose  - Continue diet, exercise  - Lifestyle modifications.  - Monitor appropriate labs.  - Hemoglobin A1c - Insulin, random  4. Vitamin D deficiency  - Continue supplementation.  -  VITAMIN D 25 Hydroxl  5. Idiopathic gout  - Uric acid  6. Testosterone Deficiency  - Testosterone  7. Medication management  - CBC with Differential/Platelet - COMPLETE METABOLIC PANEL WITH GFR - Magnesium - Lipid panel - TSH - Hemoglobin A1c - Insulin, random - VITAMIN D 25 Hydroxyl - Testosterone - Uric acid       Discussed  regular exercise, BP monitoring, weight control to achieve/maintain BMI less than 25 and discussed med and SE's. Recommended labs to assess and monitor clinical status with further disposition pending results of labs. Over 30 minutes of exam, counseling, chart review was performed.

## 2018-05-09 ENCOUNTER — Ambulatory Visit (INDEPENDENT_AMBULATORY_CARE_PROVIDER_SITE_OTHER): Payer: BC Managed Care – PPO | Admitting: Internal Medicine

## 2018-05-09 VITALS — BP 116/82 | HR 52 | Temp 97.3°F | Resp 16 | Ht 68.75 in | Wt 185.2 lb

## 2018-05-09 DIAGNOSIS — Z79899 Other long term (current) drug therapy: Secondary | ICD-10-CM | POA: Diagnosis not present

## 2018-05-09 DIAGNOSIS — M1000 Idiopathic gout, unspecified site: Secondary | ICD-10-CM | POA: Diagnosis not present

## 2018-05-09 DIAGNOSIS — E291 Testicular hypofunction: Secondary | ICD-10-CM

## 2018-05-09 DIAGNOSIS — E559 Vitamin D deficiency, unspecified: Secondary | ICD-10-CM

## 2018-05-09 DIAGNOSIS — I10 Essential (primary) hypertension: Secondary | ICD-10-CM

## 2018-05-09 DIAGNOSIS — D580 Hereditary spherocytosis: Secondary | ICD-10-CM

## 2018-05-09 DIAGNOSIS — R7309 Other abnormal glucose: Secondary | ICD-10-CM

## 2018-05-09 DIAGNOSIS — E782 Mixed hyperlipidemia: Secondary | ICD-10-CM | POA: Diagnosis not present

## 2018-05-10 LAB — COMPLETE METABOLIC PANEL WITH GFR
AG Ratio: 2.1 (calc) (ref 1.0–2.5)
ALT: 32 U/L (ref 9–46)
AST: 38 U/L — ABNORMAL HIGH (ref 10–35)
Albumin: 4.4 g/dL (ref 3.6–5.1)
Alkaline phosphatase (APISO): 72 U/L (ref 35–144)
BUN/Creatinine Ratio: 13 (calc) (ref 6–22)
BUN: 17 mg/dL (ref 7–25)
CO2: 30 mmol/L (ref 20–32)
Calcium: 9.8 mg/dL (ref 8.6–10.3)
Chloride: 96 mmol/L — ABNORMAL LOW (ref 98–110)
Creat: 1.28 mg/dL — ABNORMAL HIGH (ref 0.70–1.25)
GFR, Est African American: 69 mL/min/{1.73_m2} (ref >=60)
GFR, Est Non African American: 60 mL/min/{1.73_m2} (ref >=60)
GLUCOSE: 73 mg/dL (ref 65–99)
Globulin: 2.1 g/dL (calc) (ref 1.9–3.7)
Potassium: 4.3 mmol/L (ref 3.5–5.3)
Sodium: 136 mmol/L (ref 135–146)
Total Bilirubin: 1.1 mg/dL (ref 0.2–1.2)
Total Protein: 6.5 g/dL (ref 6.1–8.1)

## 2018-05-10 LAB — CBC WITH DIFFERENTIAL/PLATELET
Absolute Monocytes: 516 cells/uL (ref 200–950)
Basophils Absolute: 60 cells/uL (ref 0–200)
Basophils Relative: 0.7 %
EOS PCT: 1.2 %
Eosinophils Absolute: 103 cells/uL (ref 15–500)
HCT: 46.9 % (ref 38.5–50.0)
Hemoglobin: 13.9 g/dL (ref 13.2–17.1)
Lymphs Abs: 2692 cells/uL (ref 850–3900)
MCH: 18.1 pg — ABNORMAL LOW (ref 27.0–33.0)
MCHC: 29.6 g/dL — ABNORMAL LOW (ref 32.0–36.0)
MCV: 61.2 fL — ABNORMAL LOW (ref 80.0–100.0)
Monocytes Relative: 6 %
NEUTROS ABS: 5229 {cells}/uL (ref 1500–7800)
Neutrophils Relative %: 60.8 %
Platelets: 256 10*3/uL (ref 140–400)
RBC: 7.66 10*6/uL — ABNORMAL HIGH (ref 4.20–5.80)
RDW: 21.4 % — AB (ref 11.0–15.0)
TOTAL LYMPHOCYTE: 31.3 %
WBC: 8.6 10*3/uL (ref 3.8–10.8)

## 2018-05-10 LAB — VITAMIN D 25 HYDROXY (VIT D DEFICIENCY, FRACTURES): Vit D, 25-Hydroxy: 73 ng/mL (ref 30–100)

## 2018-05-10 LAB — LIPID PANEL
Cholesterol: 154 mg/dL (ref <200)
HDL: 26 mg/dL — ABNORMAL LOW (ref >=40)
LDL Cholesterol (Calc): 92 mg/dL (calc)
Non-HDL Cholesterol (Calc): 128 mg/dL (calc) (ref <130)
Total CHOL/HDL Ratio: 5.9 (calc) — ABNORMAL HIGH (ref <5.0)
Triglycerides: 273 mg/dL — ABNORMAL HIGH (ref <150)

## 2018-05-10 LAB — MAGNESIUM: MAGNESIUM: 2 mg/dL (ref 1.5–2.5)

## 2018-05-10 LAB — TESTOSTERONE: TESTOSTERONE: 1660 ng/dL — AB (ref 250–827)

## 2018-05-10 LAB — HEMOGLOBIN A1C
Hgb A1c MFr Bld: 5.4 % of total Hgb (ref <5.7)
MEAN PLASMA GLUCOSE: 108 (calc)
eAG (mmol/L): 6 (calc)

## 2018-05-10 LAB — CBC MORPHOLOGY

## 2018-05-10 LAB — TSH: TSH: 2.86 m[IU]/L (ref 0.40–4.50)

## 2018-05-10 LAB — INSULIN, RANDOM: Insulin: 13.3 u[IU]/mL

## 2018-05-10 LAB — URIC ACID: Uric Acid, Serum: 6.8 mg/dL (ref 4.0–8.0)

## 2018-05-12 ENCOUNTER — Encounter: Payer: Self-pay | Admitting: Internal Medicine

## 2018-06-16 ENCOUNTER — Other Ambulatory Visit: Payer: Self-pay | Admitting: Adult Health

## 2018-08-15 NOTE — Progress Notes (Signed)
FOLLOW UP  Assessment and Plan:   Hypertension The current medical regimen is effective;  continue present plan and medications. Monitor blood pressure at home; call if consistently greater than 130/80 Continue DASH diet.   Reminder to go to the ER if any CP, SOB, nausea, dizziness, severe HA, changes vision/speech, left arm numbness and tingling and jaw pain.  Cholesterol Not currently on medication; LDL at goal; lifestyle for trigs discussed, initiate fenofibrate if 300+ Continue diet and exercise.  Check lipid panel.   Other abnormal glucose  Recent A1Cs at goal Discussed diet/exercise, weight management  Defer A1C; check CMP  Obesity with co morbidities Long discussion about weight loss, diet, and exercise Discussed ideal weight for height (below 170) - he plans to work towards this goal Patient will work on restarting exercise Will follow up in 3 months  Vitamin D Def/ osteoporosis prevention Continue supplementation Check Vit D level  Hereditary spherocytosis CBC - monitor for anemia  Gout Continue medications Diet discussed Check uric acid as needed  Bipolar depression/ADD Is continuing to follow up with Dr. Magda Paganini, appears to be doing well on current medications.  Encouraged to work on stress, mindfulness, start exercising, and possibly follow up with counseling.   Continue diet and meds as discussed. Further disposition pending results of labs. Discussed med's effects and SE's.   Over 30 minutes of exam, counseling, chart review, and critical decision making was performed.   Future Appointments  Date Time Provider [place]  11/26/2018  3:00 PM Unk Pinto, MD [facility] None    ----------------------------------------------------------------------------------------------------------------------  HPI 63 y.o. male  presents for 3 month follow up on hypertension, cholesterol, prediabetes, weight, gout and vitamin D deficiency.  .   Patient  is followed for hx/o bipolar depression and ADD at [place] by Donata Clay. He is on risperidone, states he is doing well. He is also prescribed methylphenidate 20 mg CR for add, has klonopin 1 mg PRN which he reports uses sparingly.   BMI is Body mass index is 26.06 kg/m., he has been working on diet, watching portions particularly in the evening, but has not been exercising.  Wt Readings from Last 3 Encounters:  08/19/18 175 lb 3.2 oz (79.5 kg)  05/09/18 185 lb 3.2 oz (84 kg)  02/07/18 189 lb (85.7 kg)   His blood pressure has been controlled at home,, today their BP is BP: 118/76  He was not been working out. He denies chest pain, shortness of breath, dizziness.   He is not on cholesterol medication, takes omega 3 supplement and denies myalgias. His LDL cholesterol is at goal. The cholesterol last visit was:   Lab Results  Component Value Date   CHOL 154 05/09/2018   HDL 26 (L) 05/09/2018   LDLCALC 92 05/09/2018   TRIG 273 (H) 05/09/2018   CHOLHDL 5.9 (H) 05/09/2018    He has been working on diet and exercise for glucose management, and denies hyperglycemia, hypoglycemia , increased appetite, nausea, paresthesia of the feet, polydipsia and polyuria. Last A1C in the office was:  Lab Results  Component Value Date   HGBA1C 5.4 05/09/2018   Patient is on Vitamin D supplement, 8000 IU daily, and near goal:    Lab Results  Component Value Date   VD25OH 73 05/09/2018     Patient is on allopurinol for gout and does not report a recent flare.  Lab Results  Component Value Date   LABURIC 6.8 05/09/2018   He has a history  of testosterone deficiency and is on testosterone replacement. Has been over a week, Taking 1 cc weekly. He states that the testosterone helps with his energy, libido, muscle mass. No weak stream, no trouble with nocturia, frequency.  Lab Results  Component Value Date   TESTOSTERONE 1,660 (H) 05/09/2018     Current Medications:  Current  Outpatient Medications on File Prior to Visit  Medication Sig  . allopurinol (ZYLOPRIM) 300 MG tablet TAKE 1 TABLET BY MOUTH EVERY DAY  . atenolol (TENORMIN) 100 MG tablet TAKE 1 TABLET BY MOUTH EVERY DAY  . Cholecalciferol (VITAMIN D) 2000 UNITS tablet Take 2,000 Units by mouth 4 (four) times daily.   . clonazePAM (KLONOPIN) 1 MG tablet Take 1 mg by mouth 2 (two) times daily as needed. Takes up to 2 tabs daily prn  . cyclobenzaprine (FLEXERIL) 10 MG tablet TAKE 1/2 TO 1 TABLET 2 TO 3 TIMES DAILY FOR MUSCLE SPASM  . diclofenac sodium (VOLTAREN) 1 % GEL APPLY 4GRAMS TOPICALLY FOUR TIMES A DAY  . enalapril (VASOTEC) 20 MG tablet TAKE 1 TABLET EVERY DAY FOR BLOOD PRESSURE  . gabapentin (NEURONTIN) 800 MG tablet TAKE 6 TABLETS BY MOUTH DAILY OR AS DIRECTED FOR PAIN  . meloxicam (MOBIC) 15 MG tablet TAKE 1 TABLET BY MOUTH EVERY DAY AS NEEDED FOR PAIN/INFLAMMATION  . methylphenidate (METADATE CD) 20 MG CR capsule   . mirtazapine (REMERON) 45 MG tablet Take 45 mg by mouth at bedtime.  Marland Kitchen. NEEDLE, DISP, 21 G (YALE DISP NEEDLES 21GX1") 21G X 1" MISC Use with Depo-Testosterone injection  . Omega-3 Fatty Acids (FISH OIL) 1000 MG CAPS Take 1,000 mg by mouth.   Marland Kitchen. PROAIR HFA 108 (90 Base) MCG/ACT inhaler INHALE 2 PUFFS INTO THE LUNGS EVERY 6 HOURS AS NEEDED FOR WHEEZING OR SHORTNESS OF BREATH  . risperiDONE (RISPERDAL) 2 MG tablet Take 2 mg by mouth at bedtime.  . SYRINGE-NEEDLE, DISP, 3 ML (B-D 3CC LUER-LOK SYR 21GX1") 21G X 1" 3 ML MISC USE WITH DEPO-TESTOSTERONE INJECTION  . tadalafil (CIALIS) 20 MG tablet Take 1 tablet (20 mg total) by mouth daily as needed for erectile dysfunction.  Marland Kitchen. testosterone cypionate (DEPOTESTOSTERONE CYPIONATE) 200 MG/ML injection INJECT 2 ML INTRAMUSCULARLY EVERY 2 WEEKS AS DIRECTED  . sertraline (ZOLOFT) 100 MG tablet Take 100 mg by mouth daily.   No current facility-administered medications on file prior to visit.      Allergies:  Allergies  Allergen Reactions  . Serzone  [Nefazodone]     Leg cramps  . Trazamine [Trazodone & Diet Manage Prod] Other (See Comments)    confusion     Medical History:  Past Medical History:  Diagnosis Date  . ADD (attention deficit disorder)   . Anemia   . Depression   . Elevated hemoglobin A1c   . Hyperlipidemia   . Hypertension   . Vitamin D deficiency    Family history- Reviewed and unchanged Social history- Reviewed and unchanged   Review of Systems:  Review of Systems  Constitutional: Negative.  Negative for malaise/fatigue.  HENT: Negative for congestion, hearing loss, sore throat and tinnitus.   Eyes: Negative for blurred vision.  Respiratory: Negative for cough, shortness of breath and wheezing.   Cardiovascular: Negative for chest pain, palpitations, claudication and leg swelling.  Gastrointestinal: Negative for abdominal pain, blood in stool, constipation, diarrhea, heartburn, melena, nausea and vomiting.  Genitourinary: Negative.   Musculoskeletal: Negative for back pain, joint pain and myalgias.  Skin: Negative.  Negative for rash.  Neurological:  Negative for dizziness, tingling, tremors, sensory change, weakness and headaches.  Endo/Heme/Allergies: Negative.   Psychiatric/Behavioral: Negative for depression, substance abuse and suicidal ideas. The patient is not nervous/anxious and does not have insomnia.     Physical Exam: BP 118/76   Pulse (!) 53   Temp 97.7 F (36.5 C)   Ht 5' 8.75" (1.746 m)   Wt 175 lb 3.2 oz (79.5 kg)   SpO2 99%   BMI 26.06 kg/m  Wt Readings from Last 3 Encounters:  08/19/18 175 lb 3.2 oz (79.5 kg)  05/09/18 185 lb 3.2 oz (84 kg)  02/07/18 189 lb (85.7 kg)   General Appearance: Well nourished, in no apparent distress. Eyes: PERRLA, EOMs, conjunctiva no swelling or erythema Sinuses: No Frontal/maxillary tenderness ENT/Mouth: Ext aud canals clear, TMs without erythema, bulging. No erythema, swelling, or exudate on post pharynx.  Tonsils not swollen or erythematous.  Hearing normal.  Neck: Supple, thyroid normal.  Respiratory: Respiratory effort normal, BS equal bilaterally without rales, rhonchi, wheezing or stridor.  Cardio: RRR with no MRGs. Brisk peripheral pulses without edema.  Abdomen: Soft, + BS.  Non tender, no guarding, rebound, hernias, masses. Lymphatics: Non tender without lymphadenopathy.  Musculoskeletal: Full ROM, 5/5 strength, Normal gait Skin: Warm, dry without rashes, lesions, ecchymosis.  Neuro: Cranial nerves intact. No cerebellar symptoms.  Psych: Awake and oriented X 3, flat affect, Insight and Judgment appropriate.    Quentin MullingAmanda Najai Waszak, PA-C 9:01 AM [facility]

## 2018-08-19 ENCOUNTER — Other Ambulatory Visit: Payer: Self-pay

## 2018-08-19 ENCOUNTER — Encounter: Payer: Self-pay | Admitting: Physician Assistant

## 2018-08-19 ENCOUNTER — Ambulatory Visit: Payer: BC Managed Care – PPO | Admitting: Physician Assistant

## 2018-08-19 VITALS — BP 118/76 | HR 53 | Temp 97.7°F | Ht 68.75 in | Wt 175.2 lb

## 2018-08-19 DIAGNOSIS — I10 Essential (primary) hypertension: Secondary | ICD-10-CM | POA: Diagnosis not present

## 2018-08-19 DIAGNOSIS — F319 Bipolar disorder, unspecified: Secondary | ICD-10-CM

## 2018-08-19 DIAGNOSIS — Z79899 Other long term (current) drug therapy: Secondary | ICD-10-CM | POA: Diagnosis not present

## 2018-08-19 DIAGNOSIS — E782 Mixed hyperlipidemia: Secondary | ICD-10-CM

## 2018-08-19 DIAGNOSIS — E559 Vitamin D deficiency, unspecified: Secondary | ICD-10-CM | POA: Diagnosis not present

## 2018-08-19 DIAGNOSIS — M1000 Idiopathic gout, unspecified site: Secondary | ICD-10-CM

## 2018-08-19 DIAGNOSIS — D580 Hereditary spherocytosis: Secondary | ICD-10-CM | POA: Diagnosis not present

## 2018-08-19 DIAGNOSIS — E291 Testicular hypofunction: Secondary | ICD-10-CM

## 2018-08-19 DIAGNOSIS — R7309 Other abnormal glucose: Secondary | ICD-10-CM | POA: Diagnosis not present

## 2018-08-19 NOTE — Patient Instructions (Signed)
When it comes to diets, agreement about the perfect plan isn't easy to find, even among the experts. Experts at the [employer] developed an idea known as the Healthy Eating Plate. Just imagine a plate divided into logical, healthy portions.  The emphasis is on diet quality:  Load up on vegetables and fruits - one-half of your plate: Aim for color and variety, and remember that potatoes don't count.  Go for whole grains - one-quarter of your plate: Whole wheat, barley, wheat berries, quinoa, oats, brown rice, and foods made with them. If you want pasta, go with whole wheat pasta.  Protein power - one-quarter of your plate: Fish, chicken, beans, and nuts are all healthy, versatile protein sources. Limit red meat.  The diet, however, does go beyond the plate, offering a few other suggestions.  Use healthy plant oils, such as olive, canola, soy, corn, sunflower and peanut. Check the labels, and avoid partially hydrogenated oil, which have unhealthy trans fats.  If you're thirsty, drink water. Coffee and tea are good in moderation, but skip sugary drinks and limit milk and dairy products to one or two daily servings.  The type of carbohydrate in the diet is more important than the amount. Some sources of carbohydrates, such as vegetables, fruits, whole grains, and beans-are healthier than others.  Finally, stay active.  Can do zinc 40-50 mg a day Can try shake that is whey protein, almond milk, avocado oil, and spinach and strawberries in the morning  9 Ways to Naturally Increase Testosterone Levels  1.   Lose Weight If you're overweight, shedding the excess pounds may increase your testosterone levels, according to research presented at the Endocrine Society's 2012 meeting. Overweight men are more likely to have low testosterone levels to begin with, so this is an important trick to increase your body's testosterone production when you need it most.  2.    High-Intensity Exercise like Peak Fitness  Short intense exercise has a proven positive effect on increasing testosterone levels and preventing its decline. That's unlike aerobics or prolonged moderate exercise, which have shown to have negative or no effect on testosterone levels. Having a whey protein meal after exercise can further enhance the satiety/testosterone-boosting impact (hunger hormones cause the opposite effect on your testosterone and libido). Here's a summary of what a typical high-intensity Peak Fitness routine might look like: " Warm up for three minutes  " Exercise as hard and fast as you can for 30 seconds. You should feel like you couldn't possibly go on another few seconds  " Recover at a slow to moderate pace for 90 seconds  " Repeat the high intensity exercise and recovery 7 more times .  3.   Consume Plenty of Zinc The mineral zinc is important for testosterone production, and supplementing your diet for as little as six weeks has been shown to cause a marked improvement in testosterone among men with low levels.1 Likewise, research has shown that restricting dietary sources of zinc leads to a significant decrease in testosterone, while zinc supplementation increases it2 -- and even protects men from exercised-induced reductions in testosterone levels.3 It's estimated that up to 45 percent of adults over the age of 60 may have lower than recommended zinc intakes; even when dietary supplements were added in, an estimated 20-25 percent of older adults still had inadequate zinc intakes, according to a [employer] Survey.4 Your diet is the best source of zinc; along with protein-rich  foods like meats and fish, other good dietary sources of zinc include raw milk, raw cheese, beans, and yogurt or kefir made from raw milk. It can be difficult to obtain enough dietary zinc if you're a vegetarian, and also for meat-eaters as well, largely because of  conventional farming methods that rely heavily on chemical fertilizers and pesticides. These chemicals deplete the soil of nutrients ... nutrients like zinc that must be absorbed by plants in order to be passed on to you. In many cases, you may further deplete the nutrients in your food by the way you prepare it. For most food, cooking it will drastically reduce its levels of nutrients like zinc ... particularly over-cooking, which many people do. If you decide to use a zinc supplement, stick to a dosage of less than 40 mg a day, as this is the recommended adult upper limit. Taking too much zinc can interfere with your body's ability to absorb other minerals, especially copper, and may cause nausea as a side effect.  4.   Strength Training In addition to Peak Fitness, strength training is also known to boost testosterone levels, provided you are doing so intensely enough. When strength training to boost testosterone, you'll want to increase the weight and lower your number of reps, and then focus on exercises that work a large number of muscles, such as dead lifts or squats.  You can "turbo-charge" your weight training by going slower. By slowing down your movement, you're actually turning it into a high-intensity exercise. Super Slow movement allows your muscle, at the microscopic level, to access the maximum number of cross-bridges between the protein filaments that produce movement in the muscle.   5.   Optimize Your Vitamin D Levels Vitamin D, a steroid hormone, is essential for the healthy development of the nucleus of the sperm cell, and helps maintain semen quality and sperm count. Vitamin D also increases levels of testosterone, which may boost libido. In one study, overweight men who were given vitamin D supplements had a significant increase in testosterone levels after one year.5   6.   Reduce Stress When you're under a lot of stress, your body releases high levels of the stress hormone  cortisol. This hormone actually blocks the effects of testosterone,6 presumably because, from a biological standpoint, testosterone-associated behaviors (mating, competing, aggression) may have lowered your chances of survival in an emergency (hence, the "fight or flight" response is dominant, courtesy of cortisol).  7.   Limit or Eliminate Sugar from Your Diet Testosterone levels decrease after you eat sugar, which is likely because the sugar leads to a high insulin level, another factor leading to low testosterone.7 Based on USDA estimates, the average American consumes 12 teaspoons of sugar a day, which equates to about TWO TONS of sugar during a lifetime.  8.   Eat Healthy Fats By healthy, this means not only mon- and polyunsaturated fats, like that found in avocadoes and nuts, but also saturated, as these are essential for building testosterone. Research shows that a diet with less than 40 percent of energy as fat (and that mainly from animal sources, i.e. saturated) lead to a decrease in testosterone levels.8 My personal diet is about 60-70 percent healthy fat, and other experts agree that the ideal diet includes somewhere between 50-70 percent fat.  It's important to understand that your body requires saturated fats from animal and vegetable sources (such as meat, dairy, certain oils, and tropical plants like coconut) for optimal functioning, and if you  neglect this important food group in favor of sugar, grains and other starchy carbs, your health and weight are almost guaranteed to suffer. Examples of healthy fats you can eat more of to give your testosterone levels a boost include: Olives and Olive oil  Coconuts and coconut oil Butter made from raw grass-fed organic milk Raw nuts, such as, almonds or pecans Organic pastured egg yolks Avocados Grass-fed meats Palm oil Unheated organic nut oils   9.   Boost Your Intake of Branch Chain Amino Acids (BCAA) from Foods Like [place]  suggests that BCAAs result in higher testosterone levels, particularly when taken along with resistance training.9 While BCAAs are available in supplement form, you'll find the highest concentrations of BCAAs like leucine in dairy products - especially quality cheeses and whey protein. Even when getting leucine from your natural food supply, it's often wasted or used as a building block instead of an anabolic agent. So to create the correct anabolic environment, you need to boost leucine consumption way beyond mere maintenance levels. That said, keep in mind that using leucine as a free form amino acid can be highly counterproductive as when free form amino acids are artificially administrated, they rapidly enter your circulation while disrupting insulin function, and impairing your body's glycemic control. Food-based leucine is really the ideal form that can benefit your muscles without side effects.

## 2018-08-20 LAB — CBC WITH DIFFERENTIAL/PLATELET
Absolute Monocytes: 423 cells/uL (ref 200–950)
Basophils Absolute: 33 cells/uL (ref 0–200)
Basophils Relative: 0.5 %
Eosinophils Absolute: 111 cells/uL (ref 15–500)
Eosinophils Relative: 1.7 %
HCT: 41.7 % (ref 38.5–50.0)
Hemoglobin: 13.2 g/dL (ref 13.2–17.1)
Lymphs Abs: 2243 cells/uL (ref 850–3900)
MCH: 20.2 pg — ABNORMAL LOW (ref 27.0–33.0)
MCHC: 31.7 g/dL — ABNORMAL LOW (ref 32.0–36.0)
MCV: 63.7 fL — ABNORMAL LOW (ref 80.0–100.0)
Monocytes Relative: 6.5 %
Neutro Abs: 3692 cells/uL (ref 1500–7800)
Neutrophils Relative %: 56.8 %
Platelets: 211 10*3/uL (ref 140–400)
RBC: 6.55 10*6/uL — ABNORMAL HIGH (ref 4.20–5.80)
RDW: 24.1 % — ABNORMAL HIGH (ref 11.0–15.0)
Total Lymphocyte: 34.5 %
WBC: 6.5 10*3/uL (ref 3.8–10.8)

## 2018-08-20 LAB — COMPLETE METABOLIC PANEL WITH GFR
AG Ratio: 2.9 (calc) — ABNORMAL HIGH (ref 1.0–2.5)
ALT: 34 U/L (ref 9–46)
AST: 41 U/L — ABNORMAL HIGH (ref 10–35)
Albumin: 4.4 g/dL (ref 3.6–5.1)
Alkaline phosphatase (APISO): 77 U/L (ref 35–144)
BUN: 13 mg/dL (ref 7–25)
CO2: 32 mmol/L (ref 20–32)
Calcium: 9.7 mg/dL (ref 8.6–10.3)
Chloride: 104 mmol/L (ref 98–110)
Creat: 0.9 mg/dL (ref 0.70–1.25)
GFR, Est African American: 106 mL/min/{1.73_m2} (ref >=60)
GFR, Est Non African American: 91 mL/min/{1.73_m2} (ref >=60)
Globulin: 1.5 g/dL (calc) — ABNORMAL LOW (ref 1.9–3.7)
Glucose, Bld: 81 mg/dL (ref 65–99)
Potassium: 4.4 mmol/L (ref 3.5–5.3)
Sodium: 140 mmol/L (ref 135–146)
Total Bilirubin: 1.5 mg/dL — ABNORMAL HIGH (ref 0.2–1.2)
Total Protein: 5.9 g/dL — ABNORMAL LOW (ref 6.1–8.1)

## 2018-08-20 LAB — HEMOGLOBIN A1C
Hgb A1c MFr Bld: 5.1 % of total Hgb (ref <5.7)
Mean Plasma Glucose: 100 (calc)
eAG (mmol/L): 5.5 (calc)

## 2018-08-20 LAB — LIPID PANEL
Cholesterol: 123 mg/dL (ref <200)
HDL: 22 mg/dL — ABNORMAL LOW (ref >=40)
LDL Cholesterol (Calc): 61 mg/dL (calc)
Non-HDL Cholesterol (Calc): 101 mg/dL (calc) (ref <130)
Total CHOL/HDL Ratio: 5.6 (calc) — ABNORMAL HIGH (ref <5.0)
Triglycerides: 329 mg/dL — ABNORMAL HIGH (ref <150)

## 2018-08-20 LAB — TSH: TSH: 3.82 mIU/L (ref 0.40–4.50)

## 2018-08-20 LAB — URIC ACID: Uric Acid, Serum: 5.7 mg/dL (ref 4.0–8.0)

## 2018-08-20 LAB — MAGNESIUM: Magnesium: 2 mg/dL (ref 1.5–2.5)

## 2018-08-20 LAB — TESTOSTERONE: Testosterone: 395 ng/dL (ref 250–827)

## 2018-08-20 LAB — ESTRADIOL: Estradiol: 33 pg/mL (ref <=39)

## 2018-08-20 LAB — VITAMIN D 25 HYDROXY (VIT D DEFICIENCY, FRACTURES): Vit D, 25-Hydroxy: 61 ng/mL (ref 30–100)

## 2018-09-02 ENCOUNTER — Other Ambulatory Visit: Payer: Self-pay | Admitting: Internal Medicine

## 2018-09-05 ENCOUNTER — Other Ambulatory Visit: Payer: Self-pay | Admitting: Internal Medicine

## 2018-09-05 DIAGNOSIS — E349 Endocrine disorder, unspecified: Secondary | ICD-10-CM

## 2018-10-22 ENCOUNTER — Other Ambulatory Visit: Payer: Self-pay | Admitting: Internal Medicine

## 2018-11-04 ENCOUNTER — Other Ambulatory Visit: Payer: Self-pay | Admitting: Internal Medicine

## 2018-11-04 ENCOUNTER — Other Ambulatory Visit: Payer: Self-pay | Admitting: Physician Assistant

## 2018-11-04 DIAGNOSIS — E291 Testicular hypofunction: Secondary | ICD-10-CM

## 2018-11-04 MED ORDER — "YALE DISP NEEDLES 21G X 1"" MISC": 1 refills | Status: DC

## 2018-11-25 ENCOUNTER — Encounter: Payer: Self-pay | Admitting: Internal Medicine

## 2018-11-25 NOTE — Patient Instructions (Signed)

## 2018-11-25 NOTE — Progress Notes (Signed)
Annual  Screening/Preventative Visit  & Comprehensive Evaluation & Examination     This very nice 63 y.o. [name] presents for a Screening /Preventative Visit & comprehensive evaluation and management of multiple medical co-morbidities.  Patient has been followed for HTN, HLD, Prediabetes, Testosterone Deficiency  and Vitamin D Deficiency. Patient is followed by Donata Clay, FNP at [facility]. Patient's Gout is controlled on his meds.  Note patient is s/p Splenectomy for  Hereditary Spherocytosis. Patient also has ADD responding well with meds and improved focus & concentration.      HTN predates since 1998. Patient's BP has been controlled at home.  Today's BP is at goal - 114/72. Patient denies any cardiac symptoms as chest pain, palpitations, shortness of breath, dizziness or ankle swelling.     Patient's hyperlipidemia is controlled with diet and medications. Patient denies myalgias or other medication SE's. Last lipids were at goal albeit elevated Trig's:  Lab Results  Component Value Date   CHOL 123 08/19/2018   HDL 22 (L) 08/19/2018   LDLCALC 61 08/19/2018   TRIG 329 (H) 08/19/2018   CHOLHDL 5.6 (H) 08/19/2018      Patient has hx/o prediabetes(A1c 5.8% / 2010) and patient denies reactive hypoglycemic symptoms, visual blurring, diabetic polys or paresthesias. Last A1c was Normal & at goal:  Lab Results  Component Value Date   HGBA1C 5.1 08/19/2018       Patient has hx/o Testosterone Deficiency & has been on parenteral replacement with improved Stamina  & sense of well being.      Finally, patient has history of Vitamin D Deficiency ("31" / 2008)  and last vitamin D was at goal:  Lab Results  Component Value Date   VD25OH 61 08/19/2018   Current Outpatient Medications on File Prior to Visit  Medication Sig  . allopurinol (ZYLOPRIM) 300 MG tablet TAKE 1 TABLET BY MOUTH EVERY DAY  . atenolol (TENORMIN) 100 MG tablet TAKE 1 TABLET BY MOUTH EVERY DAY  .  Cholecalciferol (VITAMIN D) 2000 UNITS tablet Take 2,000 Units by mouth 4 (four) times daily.   . clonazePAM (KLONOPIN) 1 MG tablet Take 1 mg by mouth 2 (two) times daily as needed. Takes up to 2 tabs daily prn  . cyclobenzaprine (FLEXERIL) 10 MG tablet TAKE 1/2 TO 1 TABLET 2 TO 3 TIMES DAILY FOR MUSCLE SPASM  . diclofenac sodium (VOLTAREN) 1 % GEL APPLY 4GRAMS TOPICALLY FOUR TIMES A DAY  . enalapril (VASOTEC) 20 MG tablet TAKE 1 TABLET EVERY DAY FOR BLOOD PRESSURE  . gabapentin (NEURONTIN) 800 MG tablet TAKE 6 TABLETS BY MOUTH DAILY OR AS DIRECTED FOR PAIN  . meloxicam (MOBIC) 15 MG tablet Take 1/2 to 1 tablet Daily with Food as needed for Pain & Inflammation  . methylphenidate (METADATE CD) 20 MG CR capsule   . mirtazapine (REMERON) 45 MG tablet Take 45 mg by mouth at bedtime.  Marland Kitchen NEEDLE, DISP, 21 G (YALE DISP NEEDLES 21GX1") 21G X 1" MISC Use with Depo-Testosterone injection  . Omega-3 Fatty Acids (FISH OIL) 1000 MG CAPS Take 1,000 mg by mouth.   Marland Kitchen PROAIR HFA 108 (90 Base) MCG/ACT inhaler INHALE 2 PUFFS INTO THE LUNGS EVERY 6 HOURS AS NEEDED FOR WHEEZING OR SHORTNESS OF BREATH  . risperiDONE (RISPERDAL) 2 MG tablet Take 2 mg by mouth at bedtime.  . sertraline (ZOLOFT) 100 MG tablet Take 100 mg by mouth daily.  . SYRINGE-NEEDLE, DISP, 3 ML (B-D 3CC LUER-LOK SYR 21GX1") 21G X 1" 3  ML MISC USE WITH DEPO-TESTOSTERONE INJECTION  . tadalafil (CIALIS) 20 MG tablet Take 1 tablet (20 mg total) by mouth daily as needed for erectile dysfunction.  Marland Kitchen testosterone cypionate (DEPOTESTOSTERONE CYPIONATE) 200 MG/ML injection Inject 2 ml into  muscle every 2 weeks   No current facility-administered medications on file prior to visit.    Allergies  Allergen Reactions  . Serzone [Nefazodone]     Leg cramps  . Trazamine [Trazodone & Diet Manage Prod] Other (See Comments)    confusion   Past Medical History:  Diagnosis Date  . ADD (attention deficit disorder)   . Anemia   . Depression   . Elevated  hemoglobin A1c   . Hyperlipidemia   . Hypertension   . Vitamin D deficiency    Health Maintenance  Topic Date Due  . COLONOSCOPY  08/18/2019  . TETANUS/TDAP  03/24/2022  . INFLUENZA VACCINE  Completed  . Hepatitis C Screening  Completed  . HIV Screening  Completed   Immunization History  Administered Date(s) Administered  . Influenza Inj Mdck Quad With Preservative 12/06/2017  . Influenza, Seasonal, Injecte, Preservative Fre 12/08/2016  . Influenza,inj,Quad PF,6+ Mos 12/08/2013  . Influenza-Unspecified 12/06/2015, 12/07/2016, 10/22/2018  . PPD Test 07/23/2013, 07/24/2014, 08/09/2015, 08/30/2016, 10/23/2017  . Pneumococcal-Unspecified 03/06/2009  . Td 03/24/2012   Last Colon - 08/23/2009 - Dr Danise Edge -Recc 10 yr f/u due June 2021  Past Surgical History:  Procedure Laterality Date  . FRACTURE SURGERY    . ORTHOPEDIC SURGERY Left    left foot pinning  . ORTHOPEDIC SURGERY Left    left foot pinning  . ORTHOPEDIC SURGERY Right    right elbow bursectomy  1984  . ORTHOPEDIC SURGERY Right 2002 dr supple   right rotator cuff   Family History  Problem Relation Age of Onset  . Cancer Mother        melonoma  . Hypertension Father   . Aortic stenosis Father    Social History   Socioeconomic History  . Marital status: Married    Spouse name: Amy  . Number of children: 2 adopted chinese daughters  . Highest education level: Post Graduate  Occupational History  . [employer]  Tobacco Use  . Smoking status: Never Smoker  . Smokeless tobacco: Never Used  Substance and Sexual Activity  . Alcohol use: No    Alcohol/week: 0.0 standard drinks  . Drug use: Not on file  . Sexual activity: Not on file    ROS Constitutional: Denies fever, chills, weight loss/gain, headaches, insomnia,  night sweats or change in appetite. Does c/o fatigue. Eyes: Denies redness, blurred vision, diplopia, discharge, itchy or watery eyes.  ENT: Denies discharge, congestion, post nasal drip,  epistaxis, sore throat, earache, hearing loss, dental pain, Tinnitus, Vertigo, Sinus pain or snoring.  Cardio: Denies chest pain, palpitations, irregular heartbeat, syncope, dyspnea, diaphoresis, orthopnea, PND, claudication or edema Respiratory: denies cough, dyspnea, DOE, pleurisy, hoarseness, laryngitis or wheezing.  Gastrointestinal: Denies dysphagia, heartburn, reflux, water brash, pain, cramps, nausea, vomiting, bloating, diarrhea, constipation, hematemesis, melena, hematochezia, jaundice or hemorrhoids Genitourinary: Denies dysuria, frequency, urgency, nocturia, hesitancy, discharge, hematuria or flank pain Musculoskeletal: Denies arthralgia, myalgia, stiffness, Jt. Swelling, pain, limp or strain/sprain. Denies Falls. Skin: Denies puritis, rash, hives, warts, acne, eczema or change in skin lesion Neuro: No weakness, tremor, incoordination, spasms, paresthesia or pain Psychiatric: Denies confusion, memory loss or sensory loss. Denies Depression. Endocrine: Denies change in weight, skin, hair change, nocturia, and paresthesia, diabetic polys, visual blurring or hyper /  hypo glycemic episodes.  Heme/Lymph: No excessive bleeding, bruising or enlarged lymph nodes.  Physical Exam  BP 114/72   Pulse (!) 47   Temp 97.7 F (36.5 C)   Ht 5' 8.75" (1.746 m)   Wt 176 lb (79.8 kg)   SpO2 98%   BMI 26.18 kg/m   General Appearance: Well nourished and well groomed and in no apparent distress.  Eyes: PERRLA, EOMs, conjunctiva no swelling or erythema, normal fundi and vessels. Sinuses: No frontal/maxillary tenderness ENT/Mouth: EACs patent / TMs  nl. Nares clear without erythema, swelling, mucoid exudates. Oral hygiene is good. No erythema, swelling, or exudate. Tongue normal, non-obstructing. Tonsils not swollen or erythematous. Hearing normal.  Neck: Supple, thyroid not palpable. No bruits, nodes or JVD. Respiratory: Respiratory effort normal.  BS equal and clear bilateral without rales, rhonci,  wheezing or stridor. Cardio: Heart sounds are normal with regular rate and rhythm and no murmurs, rubs or gallops. Peripheral pulses are normal and equal bilaterally without edema. No aortic or femoral bruits. Chest: symmetric with normal excursions and percussion.  Abdomen: Soft, with Nl bowel sounds. Nontender, no guarding, rebound, hernias, masses, or organomegaly.  Lymphatics: Non tender without lymphadenopathy.  Musculoskeletal: Full ROM all peripheral extremities, joint stability, 5/5 strength, and normal gait. Skin: Warm and dry without rashes, lesions, cyanosis, clubbing or  ecchymosis.  Neuro: Cranial nerves intact, reflexes equal bilaterally. Normal muscle tone, no cerebellar symptoms. Sensation intact.  Pysch: Alert and oriented X 3 with normal affect, insight and judgment appropriate.   Assessment and Plan  1. Annual Preventative/Screening Exam   2. Essential hypertension  - EKG 12-Lead - US, RETROPERITNL ABD,  LTD - Urinalysis, Routine w reflex microscopic - Microalbumin / creatinine urine ratio - CBC with Differential/Platelet - COMPLETE METABOLIC PANEL WITH GFR - Magnesium - TSH  3. Hyperlipidemia, mixed  - EKG 12-Lead - US, RETROPERITNL ABD,  LTD - Lipid panel  4. Abnormal glucose  - EKG 12-Lead - US, RETROPERITNL ABD,  LTD - Hemoglobin A1c - Insulin, random  5. Vitamin D deficiency  - VITAMIN D 25 Hydroxyl  6. PreDiabetes  - EKG 12-Lead - US, RETROPERITNL ABD,  LTD - Hemoglobin A1c - Insulin, random  7. Idiopathic gout  - Uric acid  8. Testosterone Deficiency  - Testosterone  9. 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 Screening for colorectal cancer  - POC Hemoccult Bld/Stl  12. 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 Prostate cancer screening  - PSA   15. Screening for ischemic heart disease  - EKG 12-Lead  16. FHx: heart disease  - EKG 12-Lead - US, RETROPERITNL ABD,  LTD  17. Screening for AAA (aortic abdominal aneurysm)  - US, RETROPERITNL ABD,  LTD  18. Fatigue  - Iron,Total/Total Iron Binding Cap - Vitamin B12  19. Medication management  - Urinalysis, Routine w reflex microscopic - Microalbumin / creatinine urine ratio - Testosterone - Uric acid - CBC with Differential/Platelet - COMPLETE METABOLIC PANEL WITH GFR - Magnesium - Lipid panel - TSH - Hemoglobin A1c - Insulin, random - VITAMIN D 25 Hydroxyl         Patient was counseled in prudent diet, weight control to achieve/maintain BMI less than 25, BP monitoring, regular exercise and medications as discussed.  Discussed med effects and SE's. Routine screening labs and tests as requested  with regular follow-up as recommended. Over 40 minutes of exam, counseling, chart review and high complex critical decision making was performed   Kevin Maw, MD

## 2018-11-26 ENCOUNTER — Other Ambulatory Visit: Payer: Self-pay

## 2018-11-26 ENCOUNTER — Encounter: Payer: Self-pay | Admitting: Internal Medicine

## 2018-11-26 ENCOUNTER — Ambulatory Visit: Payer: BC Managed Care – PPO | Admitting: Internal Medicine

## 2018-11-26 VITALS — BP 114/72 | HR 47 | Temp 97.7°F | Ht 68.75 in | Wt 176.0 lb

## 2018-11-26 DIAGNOSIS — Z130 Encounter for screening for diseases of the blood and blood-forming organs and certain disorders involving the immune mechanism: Secondary | ICD-10-CM | POA: Diagnosis not present

## 2018-11-26 DIAGNOSIS — M1000 Idiopathic gout, unspecified site: Secondary | ICD-10-CM | POA: Diagnosis not present

## 2018-11-26 DIAGNOSIS — N401 Enlarged prostate with lower urinary tract symptoms: Secondary | ICD-10-CM

## 2018-11-26 DIAGNOSIS — Z131 Encounter for screening for diabetes mellitus: Secondary | ICD-10-CM

## 2018-11-26 DIAGNOSIS — I10 Essential (primary) hypertension: Secondary | ICD-10-CM | POA: Diagnosis not present

## 2018-11-26 DIAGNOSIS — E559 Vitamin D deficiency, unspecified: Secondary | ICD-10-CM

## 2018-11-26 DIAGNOSIS — R350 Frequency of micturition: Secondary | ICD-10-CM

## 2018-11-26 DIAGNOSIS — Z1211 Encounter for screening for malignant neoplasm of colon: Secondary | ICD-10-CM

## 2018-11-26 DIAGNOSIS — Z13220 Encounter for screening for lipoid disorders: Secondary | ICD-10-CM

## 2018-11-26 DIAGNOSIS — Z79899 Other long term (current) drug therapy: Secondary | ICD-10-CM | POA: Diagnosis not present

## 2018-11-26 DIAGNOSIS — Z125 Encounter for screening for malignant neoplasm of prostate: Secondary | ICD-10-CM

## 2018-11-26 DIAGNOSIS — Z8249 Family history of ischemic heart disease and other diseases of the circulatory system: Secondary | ICD-10-CM

## 2018-11-26 DIAGNOSIS — Z1389 Encounter for screening for other disorder: Secondary | ICD-10-CM

## 2018-11-26 DIAGNOSIS — E291 Testicular hypofunction: Secondary | ICD-10-CM

## 2018-11-26 DIAGNOSIS — R5383 Other fatigue: Secondary | ICD-10-CM

## 2018-11-26 DIAGNOSIS — Z0000 Encounter for general adult medical examination without abnormal findings: Secondary | ICD-10-CM

## 2018-11-26 DIAGNOSIS — N138 Other obstructive and reflux uropathy: Secondary | ICD-10-CM

## 2018-11-26 DIAGNOSIS — Z0001 Encounter for general adult medical examination with abnormal findings: Secondary | ICD-10-CM

## 2018-11-26 DIAGNOSIS — Z136 Encounter for screening for cardiovascular disorders: Secondary | ICD-10-CM | POA: Diagnosis not present

## 2018-11-26 DIAGNOSIS — Z111 Encounter for screening for respiratory tuberculosis: Secondary | ICD-10-CM | POA: Diagnosis not present

## 2018-11-26 DIAGNOSIS — R7309 Other abnormal glucose: Secondary | ICD-10-CM

## 2018-11-26 DIAGNOSIS — Z1329 Encounter for screening for other suspected endocrine disorder: Secondary | ICD-10-CM

## 2018-11-26 DIAGNOSIS — E782 Mixed hyperlipidemia: Secondary | ICD-10-CM

## 2018-11-26 DIAGNOSIS — Z1212 Encounter for screening for malignant neoplasm of rectum: Secondary | ICD-10-CM

## 2018-11-26 DIAGNOSIS — F900 Attention-deficit hyperactivity disorder, predominantly inattentive type: Secondary | ICD-10-CM

## 2018-11-26 DIAGNOSIS — D580 Hereditary spherocytosis: Secondary | ICD-10-CM

## 2018-11-27 LAB — CBC WITH DIFFERENTIAL/PLATELET
Absolute Monocytes: 379 cells/uL (ref 200–950)
Basophils Absolute: 47 cells/uL (ref 0–200)
Basophils Relative: 0.6 %
Eosinophils Absolute: 111 cells/uL (ref 15–500)
Eosinophils Relative: 1.4 %
HCT: 45.4 % (ref 38.5–50.0)
Hemoglobin: 13.9 g/dL (ref 13.2–17.1)
Lymphs Abs: 2109 cells/uL (ref 850–3900)
MCH: 19.4 pg — ABNORMAL LOW (ref 27.0–33.0)
MCHC: 30.6 g/dL — ABNORMAL LOW (ref 32.0–36.0)
MCV: 63.5 fL — ABNORMAL LOW (ref 80.0–100.0)
Monocytes Relative: 4.8 %
Neutro Abs: 5254 cells/uL (ref 1500–7800)
Neutrophils Relative %: 66.5 %
Platelets: 216 10*3/uL (ref 140–400)
RBC: 7.15 10*6/uL — ABNORMAL HIGH (ref 4.20–5.80)
RDW: 19.7 % — ABNORMAL HIGH (ref 11.0–15.0)
Total Lymphocyte: 26.7 %
WBC: 7.9 10*3/uL (ref 3.8–10.8)

## 2018-11-27 LAB — URINALYSIS, ROUTINE W REFLEX MICROSCOPIC
Bacteria, UA: NONE SEEN /HPF
Bilirubin Urine: NEGATIVE
Glucose, UA: NEGATIVE
Hgb urine dipstick: NEGATIVE
Nitrite: NEGATIVE
Protein, ur: NEGATIVE
Specific Gravity, Urine: 1.026 (ref 1.001–1.03)
Squamous Epithelial / LPF: NONE SEEN /HPF (ref <=5)
WBC, UA: NONE SEEN /HPF (ref 0–5)
pH: 7 (ref 5.0–8.0)

## 2018-11-27 LAB — IRON, TOTAL/TOTAL IRON BINDING CAP
%SAT: 18 % (calc) — ABNORMAL LOW (ref 20–48)
Iron: 68 ug/dL (ref 50–180)
TIBC: 375 mcg/dL (calc) (ref 250–425)

## 2018-11-27 LAB — COMPLETE METABOLIC PANEL WITH GFR
AG Ratio: 2.4 (calc) (ref 1.0–2.5)
ALT: 28 U/L (ref 9–46)
AST: 35 U/L (ref 10–35)
Albumin: 4.1 g/dL (ref 3.6–5.1)
Alkaline phosphatase (APISO): 64 U/L (ref 35–144)
BUN: 14 mg/dL (ref 7–25)
CO2: 30 mmol/L (ref 20–32)
Calcium: 9.4 mg/dL (ref 8.6–10.3)
Chloride: 103 mmol/L (ref 98–110)
Creat: 0.95 mg/dL (ref 0.70–1.25)
GFR, Est African American: 99 mL/min/{1.73_m2} (ref >=60)
GFR, Est Non African American: 85 mL/min/{1.73_m2} (ref >=60)
Globulin: 1.7 g/dL (calc) — ABNORMAL LOW (ref 1.9–3.7)
Glucose, Bld: 85 mg/dL (ref 65–99)
Potassium: 4.1 mmol/L (ref 3.5–5.3)
Sodium: 143 mmol/L (ref 135–146)
Total Bilirubin: 0.9 mg/dL (ref 0.2–1.2)
Total Protein: 5.8 g/dL — ABNORMAL LOW (ref 6.1–8.1)

## 2018-11-27 LAB — LIPID PANEL
Cholesterol: 134 mg/dL (ref <200)
HDL: 25 mg/dL — ABNORMAL LOW (ref >=40)
LDL Cholesterol (Calc): 64 mg/dL (calc)
Non-HDL Cholesterol (Calc): 109 mg/dL (calc) (ref <130)
Total CHOL/HDL Ratio: 5.4 (calc) — ABNORMAL HIGH (ref <5.0)
Triglycerides: 374 mg/dL — ABNORMAL HIGH (ref <150)

## 2018-11-27 LAB — MICROALBUMIN / CREATININE URINE RATIO
Creatinine, Urine: 247 mg/dL (ref 20–320)
Microalb Creat Ratio: 3 mcg/mg creat (ref <30)
Microalb, Ur: 0.7 mg/dL

## 2018-11-27 LAB — VITAMIN D 25 HYDROXY (VIT D DEFICIENCY, FRACTURES): Vit D, 25-Hydroxy: 78 ng/mL (ref 30–100)

## 2018-11-27 LAB — MAGNESIUM: Magnesium: 1.9 mg/dL (ref 1.5–2.5)

## 2018-11-27 LAB — INSULIN, RANDOM: Insulin: 3.9 u[IU]/mL

## 2018-11-27 LAB — TESTOSTERONE: Testosterone: 640 ng/dL (ref 250–827)

## 2018-11-27 LAB — HEMOGLOBIN A1C
Hgb A1c MFr Bld: 5 % of total Hgb (ref <5.7)
Mean Plasma Glucose: 97 (calc)
eAG (mmol/L): 5.4 (calc)

## 2018-11-27 LAB — CBC MORPHOLOGY

## 2018-11-27 LAB — PSA: PSA: 0.8 ng/mL (ref <=4.0)

## 2018-11-27 LAB — VITAMIN B12: Vitamin B-12: 749 pg/mL (ref 200–1100)

## 2018-11-27 LAB — TSH: TSH: 2.26 mIU/L (ref 0.40–4.50)

## 2018-11-27 LAB — URIC ACID: Uric Acid, Serum: 5.7 mg/dL (ref 4.0–8.0)

## 2018-11-28 ENCOUNTER — Other Ambulatory Visit: Payer: Self-pay | Admitting: Adult Health

## 2018-11-28 DIAGNOSIS — M1990 Unspecified osteoarthritis, unspecified site: Secondary | ICD-10-CM

## 2018-12-02 ENCOUNTER — Other Ambulatory Visit: Payer: Self-pay | Admitting: Internal Medicine

## 2018-12-02 ENCOUNTER — Other Ambulatory Visit: Payer: Self-pay | Admitting: Physician Assistant

## 2018-12-02 MED ORDER — LUER LOCK SAFETY SYRINGES 3 ML MISC: 1 refills | Status: DC

## 2018-12-02 MED ORDER — "LUER LOCK SAFETY SYRINGES 21G X 1-1/2"" 3 ML MISC": 1 refills | Status: DC

## 2019-01-23 ENCOUNTER — Other Ambulatory Visit: Payer: Self-pay | Admitting: Adult Health

## 2019-02-25 NOTE — Progress Notes (Signed)
FOLLOW UP  Assessment and Plan:   Hypertension The current medical regimen is effective;  continue present plan and medications. Monitor blood pressure at home; call if consistently greater than 130/80 Continue DASH diet.   Reminder to go to the ER if any CP, SOB, nausea, dizziness, severe HA, changes vision/speech, left arm numbness and tingling and jaw pain.  Cholesterol Not currently on medication; LDL at goal; lifestyle for trigs discussed, on omega 3 supplement; add fenofibrate for severe elevations; denies alcohol intake Continue diet and exercise.  Check lipid panel.   Other abnormal glucose  Recent A1Cs at goal Discussed diet/exercise, weight management  Defer A1C; check CMP  Overweight - with comorbidities Long discussion about weight loss, diet, and exercise Discussed ideal weight for height (below 170) - he plans to work towards this goal Patient will work on restarting exercise Will follow up in 3 months  Vitamin D Def/ osteoporosis prevention Continue supplementation Check Vit D level  Hereditary spherocytosis CBC - monitor for anemia  Hypogonadism - continue replacement therapy, check testosterone levels as needed.   Gout Continue medications Diet discussed Check uric acid as needed  Bipolar depression/ADD Is continuing to follow up with Dr. Azucena Fallen, appears to be doing well on current medications.  Encouraged to work on stress, mindfulness, start exercising, and possibly follow up with counseling.   Mid back pain/muscle spasm Exam susggestive of muscle spasm; send in flexeril, suggested heat Prednisone taper given per patient preference due to previous benefit Advised to follow up if not improving in 2 weeks or sooner with any new symptoms and will order xray   Continue diet and meds as discussed. Further disposition pending results of labs. Discussed med's effects and SE's.   Over 30 minutes of exam, counseling, chart review, and critical decision  making was performed.   Future Appointments  Date Time Provider Department Center  06/02/2019 10:30 AM [name], MD [facility] None  12/25/2019  3:00 PM [name], MD [facility] None    ----------------------------------------------------------------------------------------------------------------------  HPI 63 y.o. male  presents for 3 month follow up on hypertension, cholesterol, glucose management, weight, hypogonadism, gout and vitamin D deficiency.   Today he reports new onset mid back pain x 2-3 days, sudden onset though without trauma or falls, does report history of similar last year, improved with prednisone and flexeril; describes pain as initially sharp but has settled to spastic sensation. Denies shooting/radiating. Has been taking meloxicam, added tylenol which have been helpful.   Patient is followed for hx/o bipolar depression and ADD at [name] by [name]. He is on risperidone, states he is doing well. He is also prescribed methylphenidate 20 mg CR for add, has klonopin 1 mg PRN which he reports uses sparingly, a few per week. Last refill 12/02/2018.   BMI is Body mass index is 26.72 kg/m., he has been working on diet, watching portions particularly in the evening, but has not been exercising much, has been tired due to work, does walk the dog. Weight loss goal of <170 lb.  Wt Readings from Last 3 Encounters:  02/26/19 179 lb 9.6 oz (81.5 kg)  11/26/18 176 lb (79.8 kg)  08/19/18 175 lb 3.2 oz (79.5 kg)   His blood pressure has been controlled at home, today their BP is BP: 124/84  He was not been working out. He denies chest pain, shortness of breath, dizziness.   He is not on cholesterol medication, takes omega 3 supplement and denies myalgias. His LDL cholesterol  is at goal. Triglycerides remain elevated. He denies alcohol intake. The cholesterol last visit was:   Lab Results  Component Value Date   CHOL 134 11/26/2018   HDL 25  (L) 11/26/2018   LDLCALC 64 11/26/2018   TRIG 374 (H) 11/26/2018   CHOLHDL 5.4 (H) 11/26/2018    He has been working on diet and exercise for glucose management, and denies hyperglycemia, hypoglycemia , increased appetite, nausea, paresthesia of the feet, polydipsia and polyuria. Last A1C in the office was:  Lab Results  Component Value Date   HGBA1C 5.0 11/26/2018   Patient is on Vitamin D supplement, 8000 IU daily, and at goal:    Lab Results  Component Value Date   VD25OH 78 11/26/2018     Patient is on allopurinol for gout and does not report a recent flare.  Lab Results  Component Value Date   LABURIC 5.7 11/26/2018   He has a history of testosterone deficiency and is on testosterone replacement. Has been over a week, Taking 1 cc weekly. He states that the testosterone helps with his energy, libido, muscle mass. No weak stream, no trouble with nocturia, frequency. He is on cialis 20 mg PRN for ED.  Lab Results  Component Value Date   TESTOSTERONE 640 11/26/2018     Current Medications:  Current Outpatient Medications on File Prior to Visit  Medication Sig  . allopurinol (ZYLOPRIM) 300 MG tablet TAKE 1 TABLET BY MOUTH EVERY DAY (Patient taking differently: Takes 1/2 tablet daily)  . atenolol (TENORMIN) 100 MG tablet TAKE 1 TABLET BY MOUTH EVERY DAY  . Cholecalciferol (VITAMIN D) 2000 UNITS tablet Take 2,000 Units by mouth 4 (four) times daily.   . clonazePAM (KLONOPIN) 1 MG tablet Take 1 mg by mouth 2 (two) times daily as needed. Takes up to 2 tabs daily prn  . diclofenac sodium (VOLTAREN) 1 % GEL APPLY 4GRAMS TOPICALLY FOUR TIMES A DAY  . enalapril (VASOTEC) 20 MG tablet Take 1 tablet Daily for BP  . meloxicam (MOBIC) 15 MG tablet Take 1/2 to 1 tablet Daily with Food as needed for Pain & Inflammation  . methylphenidate (METADATE CD) 20 MG CR capsule   . mirtazapine (REMERON) 45 MG tablet Take 45 mg by mouth at bedtime.  Marland Kitchen NEEDLE, DISP, 21 G (YALE DISP NEEDLES 21GX1") 21G X  1" MISC Use with Depo-Testosterone injection  . Omega-3 Fatty Acids (FISH OIL) 1000 MG CAPS Take 1,000 mg by mouth.   Marland Kitchen PROAIR HFA 108 (90 Base) MCG/ACT inhaler INHALE 2 PUFFS INTO THE LUNGS EVERY 6 HOURS AS NEEDED FOR WHEEZING OR SHORTNESS OF BREATH  . risperiDONE (RISPERDAL) 2 MG tablet Take 2 mg by mouth at bedtime.  . sertraline (ZOLOFT) 100 MG tablet Take 100 mg by mouth daily.  . SYRINGE-NEEDLE, DISP, 3 ML (LUER LOCK SAFETY SYRINGES) 21G X 1-1/2" 3 ML MISC Inject Depo-Testosterone into muscle every 2 weeks  . tadalafil (CIALIS) 20 MG tablet Take 1 tablet (20 mg total) by mouth daily as needed for erectile dysfunction.  Marland Kitchen testosterone cypionate (DEPOTESTOSTERONE CYPIONATE) 200 MG/ML injection Inject 2 ml into  muscle every 2 weeks  . Syringe, Disposable, (LUER LOCK SAFETY SYRINGES) 3 ML MISC Inject Depo-Testosterone into muscle every 2 weeks  . SYRINGE-NEEDLE, DISP, 3 ML (B-D 3CC LUER-LOK SYR 21GX1") 21G X 1" 3 ML MISC USE WITH DEPO-TESTOSTERONE INJECTION   No current facility-administered medications on file prior to visit.     Allergies:  Allergies  Allergen Reactions  .  Serzone [Nefazodone]     Leg cramps  . Trazamine [Trazodone & Diet Manage Prod] Other (See Comments)    confusion     Medical History:  Past Medical History:  Diagnosis Date  . ADD (attention deficit disorder)   . Anemia   . Depression   . Elevated hemoglobin A1c   . Hyperlipidemia   . Hypertension   . Vitamin D deficiency    Family history- Reviewed and unchanged Social history- Reviewed and unchanged   Review of Systems:  Review of Systems  Constitutional: Negative.  Negative for malaise/fatigue.  HENT: Negative for congestion, hearing loss, sore throat and tinnitus.   Eyes: Negative for blurred vision.  Respiratory: Negative for cough, shortness of breath and wheezing.   Cardiovascular: Negative for chest pain, palpitations, claudication and leg swelling.  Gastrointestinal: Negative for  abdominal pain, blood in stool, constipation, diarrhea, heartburn, melena, nausea and vomiting.  Genitourinary: Negative.   Musculoskeletal: Positive for back pain. Negative for joint pain and myalgias. Neck pain: mid back   Skin: Negative.  Negative for rash.  Neurological: Negative for dizziness, tingling, tremors, sensory change, weakness and headaches.  Endo/Heme/Allergies: Negative.   Psychiatric/Behavioral: Negative for depression, substance abuse and suicidal ideas. The patient is not nervous/anxious and does not have insomnia.     Physical Exam: BP 124/84   Pulse (!) 51   Temp 97.7 F (36.5 C)   Ht 5' 8.75" (1.746 m)   Wt 179 lb 9.6 oz (81.5 kg)   SpO2 98%   BMI 26.72 kg/m  Wt Readings from Last 3 Encounters:  02/26/19 179 lb 9.6 oz (81.5 kg)  11/26/18 176 lb (79.8 kg)  08/19/18 175 lb 3.2 oz (79.5 kg)   General Appearance: Well nourished, in no apparent distress. Eyes: PERRLA, EOMs, conjunctiva no swelling or erythema Sinuses: No Frontal/maxillary tenderness ENT/Mouth: Ext aud canals clear, TMs without erythema, bulging. Oral exam deferred; mask in place. Hearing normal.  Neck: Supple, thyroid normal.  Respiratory: Respiratory effort normal, BS equal bilaterally without rales, rhonchi, wheezing or stridor.  Cardio: RRR with no MRGs. Brisk peripheral pulses without edema.  Abdomen: Soft, + BS.  Non tender, no guarding, rebound, hernias, masses. Lymphatics: Non tender without lymphadenopathy.  Musculoskeletal: Full ROM, 5/5 strength, Normal gait. No spinous tenderness, he has tender spastic R parasinals Skin: Warm, dry without rashes, lesions, ecchymosis.  Neuro: Cranial nerves intact. No cerebellar symptoms.  Psych: Awake and oriented X 3, flat affect, Insight and Judgment appropriate.    Dan MakerAshley C Ryszard Socarras, NP 11:26 AM [facility] OttoG[facility]

## 2019-02-26 ENCOUNTER — Other Ambulatory Visit: Payer: Self-pay

## 2019-02-26 ENCOUNTER — Encounter: Payer: Self-pay | Admitting: Adult Health

## 2019-02-26 ENCOUNTER — Other Ambulatory Visit: Payer: Self-pay | Admitting: Internal Medicine

## 2019-02-26 ENCOUNTER — Ambulatory Visit: Payer: BC Managed Care – PPO | Admitting: Adult Health

## 2019-02-26 VITALS — BP 124/84 | HR 51 | Temp 97.7°F | Ht 68.75 in | Wt 179.6 lb

## 2019-02-26 DIAGNOSIS — I10 Essential (primary) hypertension: Secondary | ICD-10-CM | POA: Diagnosis not present

## 2019-02-26 DIAGNOSIS — R7309 Other abnormal glucose: Secondary | ICD-10-CM

## 2019-02-26 DIAGNOSIS — M549 Dorsalgia, unspecified: Secondary | ICD-10-CM

## 2019-02-26 DIAGNOSIS — E291 Testicular hypofunction: Secondary | ICD-10-CM

## 2019-02-26 DIAGNOSIS — E782 Mixed hyperlipidemia: Secondary | ICD-10-CM | POA: Diagnosis not present

## 2019-02-26 DIAGNOSIS — Z1211 Encounter for screening for malignant neoplasm of colon: Secondary | ICD-10-CM

## 2019-02-26 DIAGNOSIS — Z79899 Other long term (current) drug therapy: Secondary | ICD-10-CM

## 2019-02-26 DIAGNOSIS — F900 Attention-deficit hyperactivity disorder, predominantly inattentive type: Secondary | ICD-10-CM

## 2019-02-26 DIAGNOSIS — Z6825 Body mass index (BMI) 25.0-25.9, adult: Secondary | ICD-10-CM

## 2019-02-26 DIAGNOSIS — F319 Bipolar disorder, unspecified: Secondary | ICD-10-CM

## 2019-02-26 DIAGNOSIS — Z1212 Encounter for screening for malignant neoplasm of rectum: Secondary | ICD-10-CM

## 2019-02-26 DIAGNOSIS — E559 Vitamin D deficiency, unspecified: Secondary | ICD-10-CM

## 2019-02-26 DIAGNOSIS — D580 Hereditary spherocytosis: Secondary | ICD-10-CM

## 2019-02-26 DIAGNOSIS — M1000 Idiopathic gout, unspecified site: Secondary | ICD-10-CM

## 2019-02-26 LAB — POC HEMOCCULT BLD/STL (HOME/3-CARD/SCREEN)
Card #2 Fecal Occult Blod, POC: NEGATIVE
Card #3 Fecal Occult Blood, POC: NEGATIVE
Fecal Occult Blood, POC: NEGATIVE

## 2019-02-26 MED ORDER — PREDNISONE 20 MG PO TABS: ORAL_TABLET | ORAL | 0 refills | Status: DC

## 2019-02-26 MED ORDER — CYCLOBENZAPRINE HCL 10 MG PO TABS: ORAL_TABLET | ORAL | 0 refills | Status: DC

## 2019-02-26 NOTE — Patient Instructions (Addendum)
Goals    . Exercise 150 min/wk Moderate Activity    . Weight (lb) < 170 lb (77.1 kg)         Try applying heat to back -   Don't take flexeril with klonopin or alcohol   Acute Back Pain, Adult Acute back pain is sudden and usually short-lived. It is often caused by an injury to the muscles and tissues in the back. The injury may result from:  A muscle or ligament getting overstretched or torn (strained). Ligaments are tissues that connect bones to each other. Lifting something improperly can cause a back strain.  Wear and tear (degeneration) of the spinal disks. Spinal disks are circular tissue that provides cushioning between the bones of the spine (vertebrae).  Twisting motions, such as while playing sports or doing yard work.  A hit to the back.  Arthritis. You may have a physical exam, lab tests, and imaging tests to find the cause of your pain. Acute back pain usually goes away with rest and home care. Follow these instructions at home: Managing pain, stiffness, and swelling  Take over-the-counter and prescription medicines only as told by your health care provider.  Your health care provider may recommend applying ice during the first 24-48 hours after your pain starts. To do this: ? Put ice in a plastic bag. ? Place a towel between your skin and the bag. ? Leave the ice on for 20 minutes, 2-3 times a day.  If directed, apply heat to the affected area as often as told by your health care provider. Use the heat source that your health care provider recommends, such as a moist heat pack or a heating pad. ? Place a towel between your skin and the heat source. ? Leave the heat on for 20-30 minutes. ? Remove the heat if your skin turns bright red. This is especially important if you are unable to feel pain, heat, or cold. You have a greater risk of getting burned. Activity   Do not stay in bed. Staying in bed for more than 1-2 days can delay your recovery.  Sit up and  stand up straight. Avoid leaning forward when you sit, or hunching over when you stand. ? If you work at a desk, sit close to it so you do not need to lean over. Keep your chin tucked in. Keep your neck drawn back, and keep your elbows bent at a right angle. Your arms should look like the letter "L." ? Sit high and close to the steering wheel when you drive. Add lower back (lumbar) support to your car seat, if needed.  Take short walks on even surfaces as soon as you are able. Try to increase the length of time you walk each day.  Do not sit, drive, or stand in one place for more than 30 minutes at a time. Sitting or standing for long periods of time can put stress on your back.  Do not drive or use heavy machinery while taking prescription pain medicine.  Use proper lifting techniques. When you bend and lift, use positions that put less stress on your back: ? [place] your knees. ? Keep the load close to your body. ? Avoid twisting.  Exercise regularly as told by your health care provider. Exercising helps your back heal faster and helps prevent back injuries by keeping muscles strong and flexible.  Work with a physical therapist to make a safe exercise program, as recommended by your health care provider. Do  any exercises as told by your physical therapist. Lifestyle  Maintain a healthy weight. Extra weight puts stress on your back and makes it difficult to have good posture.  Avoid activities or situations that make you feel anxious or stressed. Stress and anxiety increase muscle tension and can make back pain worse. Learn ways to manage anxiety and stress, such as through exercise. General instructions  Sleep on a firm mattress in a comfortable position. Try lying on your side with your knees slightly bent. If you lie on your back, put a pillow under your knees.  Follow your treatment plan as told by your health care provider. This may include: ? Cognitive or behavioral  therapy. ? Acupuncture or massage therapy. ? Meditation or yoga. Contact a health care provider if:  You have pain that is not relieved with rest or medicine.  You have increasing pain going down into your legs or buttocks.  Your pain does not improve after 2 weeks.  You have pain at night.  You lose weight without trying.  You have a fever or chills. Get help right away if:  You develop new bowel or bladder control problems.  You have unusual weakness or numbness in your arms or legs.  You develop nausea or vomiting.  You develop abdominal pain.  You feel faint. Summary  Acute back pain is sudden and usually short-lived.  Use proper lifting techniques. When you bend and lift, use positions that put less stress on your back.  Take over-the-counter and prescription medicines and apply heat or ice as directed by your health care provider. This information is not intended to replace advice given to you by your health care provider. Make sure you discuss any questions you have with your health care provider. Document Released: 02/20/2005 Document Revised: 06/11/2018 Document Reviewed: 10/04/2016 Elsevier Patient Education  2020 [employer].

## 2019-02-27 ENCOUNTER — Other Ambulatory Visit: Payer: Self-pay | Admitting: Adult Health

## 2019-02-27 DIAGNOSIS — R7989 Other specified abnormal findings of blood chemistry: Secondary | ICD-10-CM

## 2019-02-27 DIAGNOSIS — E039 Hypothyroidism, unspecified: Secondary | ICD-10-CM

## 2019-02-27 LAB — CBC WITH DIFFERENTIAL/PLATELET
Absolute Monocytes: 518 cells/uL (ref 200–950)
Basophils Absolute: 58 cells/uL (ref 0–200)
Basophils Relative: 0.6 %
Eosinophils Absolute: 125 cells/uL (ref 15–500)
Eosinophils Relative: 1.3 %
HCT: 45.9 % (ref 38.5–50.0)
Hemoglobin: 14.4 g/dL (ref 13.2–17.1)
Lymphs Abs: 3274 cells/uL (ref 850–3900)
MCH: 20.3 pg — ABNORMAL LOW (ref 27.0–33.0)
MCHC: 31.4 g/dL — ABNORMAL LOW (ref 32.0–36.0)
MCV: 64.8 fL — ABNORMAL LOW (ref 80.0–100.0)
Monocytes Relative: 5.4 %
Neutro Abs: 5626 cells/uL (ref 1500–7800)
Neutrophils Relative %: 58.6 %
Platelets: 212 10*3/uL (ref 140–400)
RBC: 7.08 10*6/uL — ABNORMAL HIGH (ref 4.20–5.80)
RDW: 20.9 % — ABNORMAL HIGH (ref 11.0–15.0)
Total Lymphocyte: 34.1 %
WBC: 9.6 10*3/uL (ref 3.8–10.8)

## 2019-02-27 LAB — COMPLETE METABOLIC PANEL WITH GFR
AG Ratio: 2.9 (calc) — ABNORMAL HIGH (ref 1.0–2.5)
ALT: 30 U/L (ref 9–46)
AST: 46 U/L — ABNORMAL HIGH (ref 10–35)
Albumin: 4.7 g/dL (ref 3.6–5.1)
Alkaline phosphatase (APISO): 58 U/L (ref 35–144)
BUN/Creatinine Ratio: 14 (calc) (ref 6–22)
BUN: 18 mg/dL (ref 7–25)
CO2: 26 mmol/L (ref 20–32)
Calcium: 9.5 mg/dL (ref 8.6–10.3)
Chloride: 100 mmol/L (ref 98–110)
Creat: 1.28 mg/dL — ABNORMAL HIGH (ref 0.70–1.25)
GFR, Est African American: 69 mL/min/{1.73_m2} (ref >=60)
GFR, Est Non African American: 60 mL/min/{1.73_m2} (ref >=60)
Globulin: 1.6 g/dL (calc) — ABNORMAL LOW (ref 1.9–3.7)
Glucose, Bld: 79 mg/dL (ref 65–99)
Potassium: 4.6 mmol/L (ref 3.5–5.3)
Sodium: 139 mmol/L (ref 135–146)
Total Bilirubin: 1.3 mg/dL — ABNORMAL HIGH (ref 0.2–1.2)
Total Protein: 6.3 g/dL (ref 6.1–8.1)

## 2019-02-27 LAB — LIPID PANEL
Cholesterol: 151 mg/dL (ref <200)
HDL: 26 mg/dL — ABNORMAL LOW (ref >=40)
Non-HDL Cholesterol (Calc): 125 mg/dL (calc) (ref <130)
Total CHOL/HDL Ratio: 5.8 (calc) — ABNORMAL HIGH (ref <5.0)
Triglycerides: 418 mg/dL — ABNORMAL HIGH (ref <150)

## 2019-02-27 LAB — CBC MORPHOLOGY

## 2019-02-27 LAB — TSH: TSH: 4.77 mIU/L — ABNORMAL HIGH (ref 0.40–4.50)

## 2019-02-27 LAB — MAGNESIUM: Magnesium: 2.1 mg/dL (ref 1.5–2.5)

## 2019-02-27 MED ORDER — FENOFIBRATE 145 MG PO TABS: 145.0000 mg | ORAL_TABLET | Freq: Every day | ORAL | 1 refills | Status: DC

## 2019-03-03 ENCOUNTER — Other Ambulatory Visit: Payer: Self-pay | Admitting: Internal Medicine

## 2019-03-03 ENCOUNTER — Other Ambulatory Visit: Payer: Self-pay | Admitting: Adult Health

## 2019-03-04 ENCOUNTER — Other Ambulatory Visit: Payer: Self-pay | Admitting: Internal Medicine

## 2019-03-05 ENCOUNTER — Other Ambulatory Visit: Payer: Self-pay | Admitting: Adult Health

## 2019-03-05 ENCOUNTER — Other Ambulatory Visit: Payer: Self-pay | Admitting: Internal Medicine

## 2019-03-05 DIAGNOSIS — E782 Mixed hyperlipidemia: Secondary | ICD-10-CM

## 2019-03-05 MED ORDER — FENOFIBRATE 145 MG PO TABS: ORAL_TABLET | ORAL | 3 refills | Status: DC

## 2019-03-23 ENCOUNTER — Other Ambulatory Visit: Payer: Self-pay | Admitting: Physician Assistant

## 2019-03-23 DIAGNOSIS — M1990 Unspecified osteoarthritis, unspecified site: Secondary | ICD-10-CM

## 2019-03-25 ENCOUNTER — Other Ambulatory Visit: Payer: Self-pay | Admitting: Internal Medicine

## 2019-03-25 DIAGNOSIS — E349 Endocrine disorder, unspecified: Secondary | ICD-10-CM

## 2019-04-11 ENCOUNTER — Other Ambulatory Visit: Payer: BC Managed Care – PPO

## 2019-04-11 ENCOUNTER — Other Ambulatory Visit: Payer: Self-pay

## 2019-04-11 DIAGNOSIS — E039 Hypothyroidism, unspecified: Secondary | ICD-10-CM

## 2019-04-11 DIAGNOSIS — I10 Essential (primary) hypertension: Secondary | ICD-10-CM | POA: Diagnosis not present

## 2019-04-11 LAB — TSH: TSH: 5.37 mIU/L — ABNORMAL HIGH (ref 0.40–4.50)

## 2019-04-12 ENCOUNTER — Other Ambulatory Visit: Payer: Self-pay | Admitting: Physician Assistant

## 2019-04-14 ENCOUNTER — Encounter: Payer: Self-pay | Admitting: Adult Health

## 2019-04-14 ENCOUNTER — Other Ambulatory Visit: Payer: Self-pay | Admitting: Adult Health

## 2019-04-14 DIAGNOSIS — E039 Hypothyroidism, unspecified: Secondary | ICD-10-CM | POA: Insufficient documentation

## 2019-04-14 HISTORY — DX: Hypothyroidism, unspecified: E03.9

## 2019-04-14 MED ORDER — LEVOTHYROXINE SODIUM 50 MCG PO TABS: ORAL_TABLET | ORAL | 1 refills | Status: DC

## 2019-05-22 ENCOUNTER — Other Ambulatory Visit: Payer: Self-pay | Admitting: Physician Assistant

## 2019-05-22 DIAGNOSIS — E349 Endocrine disorder, unspecified: Secondary | ICD-10-CM

## 2019-06-01 ENCOUNTER — Encounter: Payer: Self-pay | Admitting: Internal Medicine

## 2019-06-01 NOTE — Progress Notes (Signed)
History of Present Illness:       This very nice 64 y.o. [name] presents for 6 month follow up with HTN, HLD, Pre-Diabetes, Hypothyroidism, Testosterone Deficiency and Vitamin D Deficiency. Patient also has Gout controlled on his meds.   Patient also has  s/p Splenectomy for  Hereditary Spherocytosis. Patient is followed at [facility] for [facility] by Donata Clay, FNP and is on Resperidone, Sertraline, Klonopin, mirtazapine and Metadate CD.        Patient is treated for HTN (1988)  & BP has been controlled at home. Today's BP is at goal  - 114/80. Patient has had no complaints of any cardiac type chest pain, palpitations, dyspnea / orthopnea / PND, dizziness, claudication, or dependent edema.      Hyperlipidemia is controlled with diet & meds. Patient denies myalgias or other med SE's. Last Lipids were at goal except elevated Trig's:  Lab Results  Component Value Date   CHOL 151 02/26/2019   HDL 26 (L) 02/26/2019   LDLCALC not calculated 02/26/2019   TRIG 418 (H) 02/26/2019   CHOLHDL 5.8 (H) 02/26/2019    Also, the patient has history of PreDiabetes (A1c 5.8% / 2010) and has had no symptoms of reactive hypoglycemia, diabetic polys, paresthesias or visual blurring.  Last A1c was Normal & at goal:  Lab Results  Component Value Date   HGBA1C 5.0 11/26/2018       Patient has hx/o Low Testosterone & has been on parenteral replacement with improved Stamina  & sense of well being.       Patient was recently dx'd Hypothyroid in Dec 2020 and started on Thyroid Replacement in Feb 2021.          Further, the patient also has history of Vitamin D Deficiency ("31" / 2008) and supplements vitamin D without any suspected side-effects. Last vitamin D was at goal:  Lab Results  Component Value Date   VD25OH 78 11/26/2018    Current Outpatient Medications on File Prior to Visit  Medication Sig  . allopurinol (ZYLOPRIM) 300 MG tablet TAKE 1 TABLET BY MOUTH EVERY DAY  (Patient taking differently: Takes 1/2 tablet daily)  . atenolol (TENORMIN) 100 MG tablet Take 1 tablet Daily for BP  . Cholecalciferol (VITAMIN D) 2000 UNITS tablet Take 2,000 Units by mouth 4 (four) times daily.   . clonazePAM (KLONOPIN) 1 MG tablet Take 1 mg by mouth 2 (two) times daily as needed. Takes up to 2 tabs daily prn  . cyclobenzaprine (FLEXERIL) 10 MG tablet TAKE 1/2 TO 1 TABLET 2 TO 3 TIMES DAILY FOR MUSCLE SPASM  . diclofenac Sodium (VOLTAREN) 1 % GEL Apply 2 to 4 Grams topically to painful joints 2 to 4 x /day  . enalapril (VASOTEC) 20 MG tablet Take 1 tablet Daily for BP  . gabapentin (NEURONTIN) 800 MG tablet Take 6 tablets Daily as Directed for Chronic Pain  . levothyroxine (SYNTHROID) 50 MCG tablet Take 1/2-1 tab daily as directed by mouth on empty stomach for thyroid in the morning with water only. Avoid food/drinks/other medications for 30-60 min following this medication.  . meloxicam (MOBIC) 15 MG tablet Take 1/2 to 1 tablet Daily with Food as needed for Pain & Inflammation  . methylphenidate (METADATE CD) 20 MG CR capsule   . mirtazapine (REMERON) 45 MG tablet Take 45 mg by mouth at bedtime.  Marland Kitchen NEEDLE, DISP, 21 G (YALE DISP NEEDLES 21GX1") 21G X 1" MISC Use with Depo-Testosterone injection  .  Omega-3 Fatty Acids (FISH OIL) 1000 MG CAPS Take 1,000 mg by mouth.   Marland Kitchen PROAIR HFA 108 (90 Base) MCG/ACT inhaler INHALE 2 PUFFS INTO THE LUNGS EVERY 6 HOURS AS NEEDED FOR WHEEZING OR SHORTNESS OF BREATH  . risperiDONE (RISPERDAL) 2 MG tablet Take 2 mg by mouth at bedtime.  . sertraline (ZOLOFT) 100 MG tablet Take 100 mg by mouth daily.  . tadalafil (CIALIS) 20 MG tablet Take 1 tablet (20 mg total) by mouth daily as needed for erectile dysfunction.  Marland Kitchen testosterone cypionate (DEPOTESTOSTERONE CYPIONATE) 200 MG/ML injection INJECT 2 ML INTO MUSCLE EVERY 2 WEEKS  . fenofibrate (TRICOR) 145 MG tablet Take 1 tablet Daily for Triglycerides (Blood Fats)   No current facility-administered  medications on file prior to visit.    Allergies  Allergen Reactions  . Serzone [Nefazodone]     Leg cramps  . Trazamine [Trazodone & Diet Manage Prod] Other (See Comments)    confusion    PMHx:   Past Medical History:  Diagnosis Date  . ADD (attention deficit disorder)   . Anemia   . Depression   . Elevated hemoglobin A1c   . Hyperlipidemia   . Hypertension   . Vitamin D deficiency     Immunization History  Administered Date(s) Administered  . Influenza Inj Mdck Quad With Preservative 12/06/2017  . Influenza, Seasonal, Injecte, Preservative Fre 12/08/2016  . Influenza,inj,Quad PF,6+ Mos 12/08/2013  . Influenza-Unspecified 12/06/2015, 12/07/2016, 10/22/2018  . PPD Test 07/23/2013, 07/24/2014, 08/09/2015, 08/30/2016, 10/23/2017, 11/26/2018  . Pneumococcal-Unspecified 03/06/2009  . Td 03/24/2012    Past Surgical History:  Procedure Laterality Date  . FRACTURE SURGERY    . ORTHOPEDIC SURGERY Left    left foot pinning  . ORTHOPEDIC SURGERY Left    left foot pinning  . ORTHOPEDIC SURGERY Right    right elbow bursectomy  1984  . ORTHOPEDIC SURGERY Right 2002 dr supple   right rotator cuff    FHx:    Reviewed / unchanged  SHx:    Reviewed / unchanged   Systems Review:  Constitutional: Denies fever, chills, wt changes, headaches, insomnia, fatigue, night sweats, change in appetite. Eyes: Denies redness, blurred vision, diplopia, discharge, itchy, watery eyes.  ENT: Denies discharge, congestion, post nasal drip, epistaxis, sore throat, earache, hearing loss, dental pain, tinnitus, vertigo, sinus pain, snoring.  CV: Denies chest pain, palpitations, irregular heartbeat, syncope, dyspnea, diaphoresis, orthopnea, PND, claudication or edema. Respiratory: denies cough, dyspnea, DOE, pleurisy, hoarseness, laryngitis, wheezing.  Gastrointestinal: Denies dysphagia, odynophagia, heartburn, reflux, water brash, abdominal pain or cramps, nausea, vomiting, bloating, diarrhea,  constipation, hematemesis, melena, hematochezia  or hemorrhoids. Genitourinary: Denies dysuria, frequency, urgency, nocturia, hesitancy, discharge, hematuria or flank pain. Musculoskeletal: Denies arthralgias, myalgias, stiffness, jt. swelling, pain, limping or strain/sprain.  Skin: Denies pruritus, rash, hives, warts, acne, eczema or change in skin lesion(s). Neuro: No weakness, tremor, incoordination, spasms, paresthesia or pain. Psychiatric: Denies confusion, memory loss or sensory loss. Endo: Denies change in weight, skin or hair change.  Heme/Lymph: No excessive bleeding, bruising or enlarged lymph nodes.  Physical Exam  BP 114/80   Pulse (!) 52   Temp (!) 96.7 F (35.9 C)   Resp 16   Ht 5' 8.75" (1.746 m)   Wt 185 lb 6.4 oz (84.1 kg)   BMI 27.58 kg/m   Appears  well nourished, well groomed  and in no distress.  Eyes: PERRLA, EOMs, conjunctiva no swelling or erythema. Sinuses: No frontal/maxillary tenderness ENT/Mouth: EAC's clear, TM's nl w/o erythema, bulging.  Nares clear w/o erythema, swelling, exudates. Oropharynx clear without erythema or exudates. Oral hygiene is good. Tongue normal, non obstructing. Hearing intact.  Neck: Supple. Thyroid not palpable. Car 2+/2+ without bruits, nodes or JVD. Chest: Respirations nl with BS clear & equal w/o rales, rhonchi, wheezing or stridor.  Cor: Heart sounds normal w/ regular rate and rhythm without sig. murmurs, gallops, clicks or rubs. Peripheral pulses normal and equal  without edema.  Abdomen: Soft & bowel sounds normal. Non-tender w/o guarding, rebound, hernias, masses or organomegaly.  Lymphatics: Unremarkable.  Musculoskeletal: Full ROM all peripheral extremities, joint stability, 5/5 strength and normal gait.  Skin: Warm, dry without exposed rashes, lesions or ecchymosis apparent.  Neuro: Cranial nerves intact, reflexes equal bilaterally. Sensory-motor testing grossly intact. Tendon reflexes grossly intact.  Pysch: Alert &  oriented x 3.  Insight and judgement nl & appropriate. No ideations.  Assessment and Plan:  1. Essential hypertension  - Continue medication, monitor blood pressure at home.  - Continue DASH diet.  Reminder to go to the ER if any CP,  SOB, nausea, dizziness, severe HA, changes vision/speech.  - CBC with Differential/Platelet - COMPLETE METABOLIC PANEL WITH GFR - Magnesium - TSH  2. Hyperlipidemia, mixed  - Continue diet/meds, exercise,& lifestyle modifications.  - Continue monitor periodic cholesterol/liver & renal functions   - Lipid panel - TSH  3. Abnormal glucose  - Continue diet, exercise  - Lifestyle modifications.  - Monitor appropriate labs.  - Hemoglobin A1c - Insulin, random  4. Vitamin D deficiency  - Continue supplementation.  - VITAMIN D 25 Hydroxy  5. Hypothyroidism  - TSH  6. Testosterone Deficiency  - Testosterone  7. PreDiabetes  - Hemoglobin A1c - Insulin, random  8. Medication management  - CBC with Differential/Platelet - COMPLETE METABOLIC PANEL WITH GFR - Magnesium - Lipid panel - TSH - Hemoglobin A1c - Insulin, random - VITAMIN D 25 Hydroxy - Testosterone        Discussed  regular exercise, BP monitoring, weight control to achieve/maintain BMI less than 25 and discussed med and SE's. Recommended labs to assess and monitor clinical status with further disposition pending results of labs.  I discussed the assessment and treatment plan with the patient. The patient was provided an opportunity to ask questions and all were answered. The patient agreed with the plan and demonstrated an understanding of the instructions.  I provided over 30 minutes of exam, counseling, chart review and  complex critical decision making.   Marinus Maw, MD

## 2019-06-01 NOTE — Patient Instructions (Addendum)

## 2019-06-02 ENCOUNTER — Ambulatory Visit (INDEPENDENT_AMBULATORY_CARE_PROVIDER_SITE_OTHER): Payer: BC Managed Care – PPO | Admitting: Internal Medicine

## 2019-06-02 ENCOUNTER — Other Ambulatory Visit: Payer: Self-pay

## 2019-06-02 VITALS — BP 114/80 | HR 52 | Temp 96.7°F | Resp 16 | Ht 68.75 in | Wt 185.4 lb

## 2019-06-02 DIAGNOSIS — R7309 Other abnormal glucose: Secondary | ICD-10-CM | POA: Diagnosis not present

## 2019-06-02 DIAGNOSIS — E291 Testicular hypofunction: Secondary | ICD-10-CM

## 2019-06-02 DIAGNOSIS — E039 Hypothyroidism, unspecified: Secondary | ICD-10-CM

## 2019-06-02 DIAGNOSIS — E559 Vitamin D deficiency, unspecified: Secondary | ICD-10-CM

## 2019-06-02 DIAGNOSIS — I10 Essential (primary) hypertension: Secondary | ICD-10-CM

## 2019-06-02 DIAGNOSIS — Z79899 Other long term (current) drug therapy: Secondary | ICD-10-CM | POA: Diagnosis not present

## 2019-06-02 DIAGNOSIS — E782 Mixed hyperlipidemia: Secondary | ICD-10-CM | POA: Diagnosis not present

## 2019-06-03 LAB — CBC WITH DIFFERENTIAL/PLATELET
Absolute Monocytes: 680 cells/uL (ref 200–950)
Basophils Absolute: 52 cells/uL (ref 0–200)
Basophils Relative: 0.5 %
Eosinophils Absolute: 113 cells/uL (ref 15–500)
Eosinophils Relative: 1.1 %
HCT: 51.8 % — ABNORMAL HIGH (ref 38.5–50.0)
Hemoglobin: 15.9 g/dL (ref 13.2–17.1)
Lymphs Abs: 2719 cells/uL (ref 850–3900)
MCH: 19.9 pg — ABNORMAL LOW (ref 27.0–33.0)
MCHC: 30.7 g/dL — ABNORMAL LOW (ref 32.0–36.0)
MCV: 64.9 fL — ABNORMAL LOW (ref 80.0–100.0)
Monocytes Relative: 6.6 %
Neutro Abs: 6736 cells/uL (ref 1500–7800)
Neutrophils Relative %: 65.4 %
Platelets: 197 10*3/uL (ref 140–400)
RBC: 7.98 10*6/uL — ABNORMAL HIGH (ref 4.20–5.80)
RDW: 20.4 % — ABNORMAL HIGH (ref 11.0–15.0)
Total Lymphocyte: 26.4 %
WBC: 10.3 10*3/uL (ref 3.8–10.8)

## 2019-06-03 LAB — COMPLETE METABOLIC PANEL WITH GFR
AG Ratio: 2.2 (calc) (ref 1.0–2.5)
ALT: 33 U/L (ref 9–46)
AST: 53 U/L — ABNORMAL HIGH (ref 10–35)
Albumin: 4.6 g/dL (ref 3.6–5.1)
Alkaline phosphatase (APISO): 68 U/L (ref 35–144)
BUN: 20 mg/dL (ref 7–25)
CO2: 28 mmol/L (ref 20–32)
Calcium: 10 mg/dL (ref 8.6–10.3)
Chloride: 100 mmol/L (ref 98–110)
Creat: 1.04 mg/dL (ref 0.70–1.25)
GFR, Est African American: 88 mL/min/{1.73_m2} (ref >=60)
GFR, Est Non African American: 76 mL/min/{1.73_m2} (ref >=60)
Globulin: 2.1 g/dL (calc) (ref 1.9–3.7)
Glucose, Bld: 75 mg/dL (ref 65–99)
Potassium: 4.9 mmol/L (ref 3.5–5.3)
Sodium: 136 mmol/L (ref 135–146)
Total Bilirubin: 1.6 mg/dL — ABNORMAL HIGH (ref 0.2–1.2)
Total Protein: 6.7 g/dL (ref 6.1–8.1)

## 2019-06-03 LAB — VITAMIN D 25 HYDROXY (VIT D DEFICIENCY, FRACTURES): Vit D, 25-Hydroxy: 76 ng/mL (ref 30–100)

## 2019-06-03 LAB — LIPID PANEL
Cholesterol: 148 mg/dL (ref <200)
HDL: 28 mg/dL — ABNORMAL LOW (ref >=40)
LDL Cholesterol (Calc): 85 mg/dL (calc)
Non-HDL Cholesterol (Calc): 120 mg/dL (calc) (ref <130)
Total CHOL/HDL Ratio: 5.3 (calc) — ABNORMAL HIGH (ref <5.0)
Triglycerides: 265 mg/dL — ABNORMAL HIGH (ref <150)

## 2019-06-03 LAB — TESTOSTERONE: Testosterone: 550 ng/dL (ref 250–827)

## 2019-06-03 LAB — MAGNESIUM: Magnesium: 2.1 mg/dL (ref 1.5–2.5)

## 2019-06-03 LAB — TSH: TSH: 3.46 mIU/L (ref 0.40–4.50)

## 2019-06-03 LAB — HEMOGLOBIN A1C
Hgb A1c MFr Bld: 5.2 % of total Hgb (ref <5.7)
Mean Plasma Glucose: 103 (calc)
eAG (mmol/L): 5.7 (calc)

## 2019-06-03 LAB — INSULIN, RANDOM: Insulin: 1.8 u[IU]/mL

## 2019-06-03 LAB — CBC MORPHOLOGY

## 2019-07-20 ENCOUNTER — Other Ambulatory Visit: Payer: Self-pay | Admitting: Internal Medicine

## 2019-07-20 DIAGNOSIS — M1990 Unspecified osteoarthritis, unspecified site: Secondary | ICD-10-CM

## 2019-09-03 ENCOUNTER — Telehealth: Payer: Self-pay | Admitting: *Deleted

## 2019-09-03 NOTE — Telephone Encounter (Signed)
Per Dr Oneta Rack, patient can try OTC Diclofenac Gel.  No precert done at this time. Patient is aware.

## 2019-09-15 NOTE — Progress Notes (Signed)
FOLLOW UP  Assessment and Plan:   Hypertension At goal; continue medications Monitor blood pressure at home; call if consistently greater than 130/80 Continue DASH diet.   Reminder to go to the ER if any CP, SOB, nausea, dizziness, severe HA, changes vision/speech, left arm numbness and tingling and jaw pain.  Cholesterol LDL at goal, on fish oil for trigs, remains modestly elevation - patient prefers to avoid adding another med, discussed lifestyle at length  Denies alcohol intake  Continue diet and exercise.  Check lipid panel.   Other abnormal glucose  Recent A1Cs at goal Discussed diet/exercise, weight management  Defer A1C; check CMP  Overweight - with comorbidities Long discussion about weight loss, diet, and exercise Discussed ideal weight for height (below 170) - he is essentially  Patient will work on restarting exercise Will follow up in 3 months  Vitamin D Def/ osteoporosis prevention At goal at recent check; continue to recommend supplementation for goal of 60-100 Defer vitamin D level  Hereditary spherocytosis CBC - monitor for anemia  Hypogonadism - continue replacement therapy, check testosterone levels as needed.   Hypothyroidism continue medications the same pending lab results reminded to take on an empty stomach 30-22mins before food.  check TSH level  Bipolar depression/ADD Is continuing to follow up with Dr. Azucena Fallen, appears to be doing well on current medications.  Encouraged to work on stress, mindfulness, start exercising, and possibly follow up with counseling.    Continue diet and meds as discussed. Further disposition pending results of labs. Discussed med's effects and SE's.   Over 30 minutes of exam, counseling, chart review, and critical decision making was performed.   Future Appointments  Date Time Provider Department Center  12/25/2019  3:00 PM [name], MD [facility] None     ----------------------------------------------------------------------------------------------------------------------  HPI 64 y.o. male  presents for 3 month follow up on hypertension, cholesterol, glucose management, weight, hypogonadism, gout and vitamin D deficiency. Gout is controlled on meds.   Patient is followed for hx/o bipolar depression and ADD at [name] by [name]. He is on risperidone, states he is doing well. He is also prescribed methylphenidate 20 mg CR for add, has klonopin 1 mg PRN which he reports uses sparingly, a few per week.   He has trigeminal neuralgia, takes gabapentin 800 mg, typically TID but up to 6 tabs/day when severe, has been taking this for many years per patient.   BMI is Body mass index is 25.64 kg/m., he has been working on diet, watching portions particularly in the evening, doing more plant based, but has not been exercising much, has been tired due to work, does walk the dog. Weight loss goal of <170 lb.  Wt Readings from Last 3 Encounters:  09/16/19 172 lb 6.4 oz (78.2 kg)  06/02/19 185 lb 6.4 oz (84.1 kg)  02/26/19 179 lb 9.6 oz (81.5 kg)   His blood pressure has been controlled at home (120-130s/80s), today their BP is BP: 112/82  He was not been working out. He denies chest pain, shortness of breath, dizziness. Discussed bradycardia (P 44 today, patient denies sx, has been stable, IRBBB on EKG, declined atenolol adjustment).    He is not on cholesterol medication, takes omega 3 supplement, was prescribed fenofibrate but never picked up and denies myalgias. His LDL cholesterol is at goal. Triglycerides remain elevated. He denies alcohol intake. The cholesterol last visit was:   Lab Results  Component Value Date   CHOL 148 06/02/2019  HDL 28 (L) 06/02/2019   LDLCALC 85 06/02/2019   TRIG 265 (H) 06/02/2019   CHOLHDL 5.3 (H) 06/02/2019    He has been working on diet and exercise for glucose management, and denies  hyperglycemia, hypoglycemia , increased appetite, nausea, paresthesia of the feet, polydipsia and polyuria. Last A1C in the office was:  Lab Results  Component Value Date   HGBA1C 5.2 06/02/2019   He is on thyroid medication. His medication was not changed last visit.  Currently taking 50 mcg when he gets home (no food for 4 hours),  Lab Results  Component Value Date   TSH 3.46 06/02/2019   Patient is on Vitamin D supplement, 8000 IU daily, and at goal:    Lab Results  Component Value Date   VD25OH 76 06/02/2019     He has a history of testosterone deficiency and is on testosterone replacement. Taking 1 cc weekly, last injection was 7/11. He states that the testosterone helps with his energy, libido, muscle mass. No weak stream, no trouble with nocturia, frequency. He is on cialis 20 mg PRN for ED.  Lab Results  Component Value Date   TESTOSTERONE 550 06/02/2019     Current Medications:  Current Outpatient Medications on File Prior to Visit  Medication Sig  . allopurinol (ZYLOPRIM) 300 MG tablet TAKE 1 TABLET BY MOUTH EVERY DAY (Patient taking differently: Takes 1/2 tablet daily)  . atenolol (TENORMIN) 100 MG tablet Take 1 tablet Daily for BP  . Cholecalciferol (VITAMIN D) 2000 UNITS tablet Take 2,000 Units by mouth 4 (four) times daily.   . clonazePAM (KLONOPIN) 1 MG tablet Take 1 mg by mouth 2 (two) times daily as needed. Takes up to 2 tabs daily prn  . diclofenac Sodium (VOLTAREN) 1 % GEL APPLY 2 TO 4 GRAMS TOPICALLY TO PAINFUL JOINTS 2 TO 4 X /DAY  . enalapril (VASOTEC) 20 MG tablet Take 1 tablet Daily for BP  . gabapentin (NEURONTIN) 800 MG tablet Take 6 tablets Daily as Directed for Chronic Pain  . levothyroxine (SYNTHROID) 50 MCG tablet Take 1/2-1 tab daily as directed by mouth on empty stomach for thyroid in the morning with water only. Avoid food/drinks/other medications for 30-60 min following this medication.  . meloxicam (MOBIC) 15 MG tablet Take 1/2 to 1 tablet Daily  with Food as needed for Pain & Inflammation  . methylphenidate (METADATE CD) 20 MG CR capsule   . mirtazapine (REMERON) 45 MG tablet Take 45 mg by mouth at bedtime.  Marland Kitchen NEEDLE, DISP, 21 G (YALE DISP NEEDLES 21GX1") 21G X 1" MISC Use with Depo-Testosterone injection  . Omega-3 Fatty Acids (FISH OIL) 1000 MG CAPS Take 1,000 mg by mouth.   . risperiDONE (RISPERDAL) 2 MG tablet Take 2 mg by mouth at bedtime.  . sertraline (ZOLOFT) 100 MG tablet Take 100 mg by mouth daily.  . tadalafil (CIALIS) 20 MG tablet Take 1 tablet (20 mg total) by mouth daily as needed for erectile dysfunction.  Marland Kitchen testosterone cypionate (DEPOTESTOSTERONE CYPIONATE) 200 MG/ML injection INJECT 2 ML INTO MUSCLE EVERY 2 WEEKS  . cyclobenzaprine (FLEXERIL) 10 MG tablet TAKE 1/2 TO 1 TABLET 2 TO 3 TIMES DAILY FOR MUSCLE SPASM  . fenofibrate (TRICOR) 145 MG tablet Take 1 tablet Daily for Triglycerides (Blood Fats) (Patient not taking: Reported on 09/16/2019)  . PROAIR HFA 108 (90 Base) MCG/ACT inhaler INHALE 2 PUFFS INTO THE LUNGS EVERY 6 HOURS AS NEEDED FOR WHEEZING OR SHORTNESS OF BREATH (Patient not taking: Reported on  09/16/2019)   No current facility-administered medications on file prior to visit.     Allergies:  Allergies  Allergen Reactions  . Serzone [Nefazodone]     Leg cramps  . Trazamine [Trazodone & Diet Manage Prod] Other (See Comments)    confusion     Medical History:  Past Medical History:  Diagnosis Date  . ADD (attention deficit disorder)   . Anemia   . Depression   . Elevated hemoglobin A1c   . Hyperlipidemia   . Hypertension   . Vitamin D deficiency    Family history- Reviewed and unchanged Social history- Reviewed and unchanged   Review of Systems:  Review of Systems  Constitutional: Negative.  Negative for malaise/fatigue.  HENT: Negative for congestion, hearing loss, sore throat and tinnitus.   Eyes: Negative for blurred vision.  Respiratory: Negative for cough, shortness of breath and  wheezing.   Cardiovascular: Negative for chest pain, palpitations, claudication and leg swelling.  Gastrointestinal: Negative for abdominal pain, blood in stool, constipation, diarrhea, heartburn, melena, nausea and vomiting.  Genitourinary: Negative.   Musculoskeletal: Negative for back pain, joint pain and myalgias.  Skin: Negative.  Negative for rash.  Neurological: Negative for dizziness, tingling, tremors, sensory change, weakness and headaches.  Endo/Heme/Allergies: Negative.   Psychiatric/Behavioral: Negative for depression, substance abuse and suicidal ideas. The patient is not nervous/anxious and does not have insomnia.     Physical Exam: BP 112/82   Pulse (!) 44   Temp (!) 97.5 F (36.4 C)   Ht 5' 8.75" (1.746 m)   Wt 172 lb 6.4 oz (78.2 kg)   SpO2 96%   BMI 25.64 kg/m  Wt Readings from Last 3 Encounters:  09/16/19 172 lb 6.4 oz (78.2 kg)  06/02/19 185 lb 6.4 oz (84.1 kg)  02/26/19 179 lb 9.6 oz (81.5 kg)   General Appearance: Well nourished, in no apparent distress. Eyes: PERRLA, EOMs, conjunctiva no swelling or erythema Sinuses: No Frontal/maxillary tenderness ENT/Mouth: Ext aud canals clear, TMs without erythema, bulging. No erythema, swelling, or exudate on post pharynx.  Tonsils not swollen or erythematous. Hearing normal.  Neck: Supple, thyroid normal.  Respiratory: Respiratory effort normal, BS equal bilaterally without rales, rhonchi, wheezing or stridor.  Cardio: RRR with no MRGs. Brisk peripheral pulses without edema.  Abdomen: Soft, + BS.  Non tender, no guarding, rebound, hernias, masses. Lymphatics: Non tender without lymphadenopathy.  Musculoskeletal: Full ROM, 5/5 strength, Normal gait.  Skin: Warm, dry without rashes, lesions, ecchymosis.  Neuro: Cranial nerves intact. No cerebellar symptoms.  Psych: Awake and oriented X 3, flat affect, Insight and Judgment appropriate.    Dan Maker, NP 8:46 AM [facility]

## 2019-09-16 ENCOUNTER — Ambulatory Visit (INDEPENDENT_AMBULATORY_CARE_PROVIDER_SITE_OTHER): Payer: BC Managed Care – PPO | Admitting: Adult Health

## 2019-09-16 ENCOUNTER — Other Ambulatory Visit: Payer: Self-pay

## 2019-09-16 ENCOUNTER — Encounter: Payer: Self-pay | Admitting: Adult Health

## 2019-09-16 VITALS — BP 112/82 | HR 44 | Temp 97.5°F | Ht 68.75 in | Wt 172.4 lb

## 2019-09-16 DIAGNOSIS — E559 Vitamin D deficiency, unspecified: Secondary | ICD-10-CM

## 2019-09-16 DIAGNOSIS — E782 Mixed hyperlipidemia: Secondary | ICD-10-CM

## 2019-09-16 DIAGNOSIS — Z79899 Other long term (current) drug therapy: Secondary | ICD-10-CM

## 2019-09-16 DIAGNOSIS — E291 Testicular hypofunction: Secondary | ICD-10-CM | POA: Diagnosis not present

## 2019-09-16 DIAGNOSIS — F319 Bipolar disorder, unspecified: Secondary | ICD-10-CM

## 2019-09-16 DIAGNOSIS — E039 Hypothyroidism, unspecified: Secondary | ICD-10-CM

## 2019-09-16 DIAGNOSIS — F900 Attention-deficit hyperactivity disorder, predominantly inattentive type: Secondary | ICD-10-CM

## 2019-09-16 DIAGNOSIS — I10 Essential (primary) hypertension: Secondary | ICD-10-CM

## 2019-09-16 DIAGNOSIS — R7309 Other abnormal glucose: Secondary | ICD-10-CM

## 2019-09-16 DIAGNOSIS — M549 Dorsalgia, unspecified: Secondary | ICD-10-CM

## 2019-09-16 DIAGNOSIS — Z6825 Body mass index (BMI) 25.0-25.9, adult: Secondary | ICD-10-CM

## 2019-09-16 DIAGNOSIS — M1000 Idiopathic gout, unspecified site: Secondary | ICD-10-CM

## 2019-09-16 DIAGNOSIS — D580 Hereditary spherocytosis: Secondary | ICD-10-CM

## 2019-09-16 MED ORDER — CYCLOBENZAPRINE HCL 10 MG PO TABS: ORAL_TABLET | ORAL | 0 refills | Status: DC

## 2019-09-16 NOTE — Patient Instructions (Addendum)
Goals    . Exercise 150 min/wk Moderate Activity    . Weight (lb) < 170 lb (77.1 kg)       High Triglycerides Eating Plan Triglycerides are a type of fat in the blood. High levels of triglycerides can increase your risk of heart disease and stroke. If your triglyceride levels are high, choosing the right foods can help lower your triglycerides and keep your heart healthy. Work with your health care provider or a diet and nutrition specialist (dietitian) to develop an eating plan that is right for you. What are tips for following this plan? General guidelines   Lose weight, if you are overweight. For most people, losing 5-10 lbs (2-5 kg) helps lower triglyceride levels. A weight-loss plan may include. ? 30 minutes of exercise at least 5 days a week. ? Reducing the amount of calories, sugar, and fat you eat.  Eat a wide variety of fresh fruits, vegetables, and whole grains. These foods are high in fiber.  Eat foods that contain healthy fats, such as fatty fish, nuts, seeds, and olive oil.  Avoid foods that are high in added sugar, added salt (sodium), saturated fat, and trans fat.  Avoid low-fiber, refined carbohydrates such as white bread, crackers, noodles, and white rice.  Avoid foods with partially hydrogenated oils (trans fats), such as fried foods or stick margarine.  Limit alcohol intake to no more than 1 drink a day for nonpregnant women and 2 drinks a day for men. One drink equals 12 oz of beer, 5 oz of wine, or 1 oz of hard liquor. Your health care provider may recommend that you drink less depending on your overall health. Reading food labels  Check food labels for the amount of saturated fat. Choose foods with no or very little saturated fat.  Check food labels for the amount of trans fat. Choose foods with no trans fat.  Check food labels for the amount of cholesterol. Choose foods low in cholesterol. Ask your dietitian how much cholesterol you should have each  day.  Check food labels for the amount of sodium. Choose foods with less than 140 milligrams (mg) per serving. Shopping  Buy dairy products labeled as nonfat (skim) or low-fat (1%).  Avoid buying processed or prepackaged foods. These are often high in added sugar, sodium, and fat. Cooking  Choose healthy fats when cooking, such as olive oil or canola oil.  Cook foods using lower fat methods, such as baking, broiling, boiling, or grilling.  Make your own sauces, dressings, and marinades when possible, instead of buying them. Store-bought sauces, dressings, and marinades are often high in sodium and sugar. Meal planning  Eat more home-cooked food and less restaurant, buffet, and fast food.  Eat fatty fish at least 2 times each week. Examples of fatty fish include salmon, trout, mackerel, tuna, and herring.  If you eat whole eggs, do not eat more than 3 egg yolks per week. What foods are recommended? The items listed may not be a complete list. Talk with your dietitian about what dietary choices are best for you. Grains Whole wheat or whole grain breads, crackers, cereals, and pasta. Unsweetened oatmeal. Bulgur. Barley. Quinoa. Brown rice. Whole wheat flour tortillas. Vegetables Fresh or frozen vegetables. Low-sodium canned vegetables. Fruits All fresh, canned (in natural juice), or frozen fruits. Meats and other protein foods Skinless chicken or [place]. Ground chicken or [place]. Lean cuts of pork, trimmed of fat. Fish and seafood, especially salmon, trout, and herring. Egg whites. Dried  beans, peas, or lentils. Unsalted nuts or seeds. Unsalted canned beans. Natural peanut or almond butter. Dairy Low-fat dairy products. Skim or low-fat (1%) milk. Reduced fat (2%) and low-sodium cheese. Low-fat ricotta cheese. Low-fat cottage cheese. Plain, low-fat yogurt. Fats and oils Tub margarine without trans fats. Light or reduced-fat mayonnaise. Light or reduced-fat salad dressings. Avocado.  Safflower, olive, sunflower, soybean, and canola oils. What foods are not recommended? The items listed may not be a complete list. Talk with your dietitian about what dietary choices are best for you. Grains White bread. White (regular) pasta. White rice. Cornbread. Bagels. Pastries. Crackers that contain trans fat. Vegetables Creamed or fried vegetables. Vegetables in a cheese sauce. Fruits Sweetened dried fruit. Canned fruit in syrup. Fruit juice. Meats and other protein foods Fatty cuts of meat. Ribs. Chicken wings. Tomasa Blase. Sausage. Bologna. Salami. Chitterlings. Fatback. Hot dogs. Bratwurst. Packaged lunch meats. Dairy Whole or reduced-fat (2%) milk. Half-and-half. Cream cheese. Full-fat or sweetened yogurt. Full-fat cheese. Nondairy creamers. Whipped toppings. Processed cheese or cheese spreads. Cheese curds. Beverages Alcohol. Sweetened drinks, such as soda, lemonade, fruit drinks, or punches. Fats and oils Butter. Stick margarine. Lard. Shortening. Ghee. Bacon fat. Tropical oils, such as coconut, palm kernel, or palm oils. Sweets and desserts Corn syrup. Sugars. Honey. Molasses. Candy. Jam and jelly. Syrup. Sweetened cereals. Cookies. Pies. Cakes. Donuts. Muffins. Ice cream. Condiments Store-bought sauces, dressings, and marinades that are high in sugar, such as ketchup and barbecue sauce. Summary  High levels of triglycerides can increase the risk of heart disease and stroke. Choosing the right foods can help lower your triglycerides.  Eat plenty of fresh fruits, vegetables, and whole grains. Choose low-fat dairy and lean meats. Eat fatty fish at least twice a week.  Avoid processed and prepackaged foods with added sugar, sodium, saturated fat, and trans fat.  If you need suggestions or have questions about what types of food are good for you, talk with your health care provider or a dietitian. This information is not intended to replace advice given to you by your health care  provider. Make sure you discuss any questions you have with your health care provider. Document Revised: 02/02/2017 Document Reviewed: 04/25/2016 Elsevier Patient Education  2020 [employer].

## 2019-09-17 LAB — CBC MORPHOLOGY

## 2019-09-17 LAB — LIPID PANEL
Cholesterol: 145 mg/dL (ref <200)
HDL: 28 mg/dL — ABNORMAL LOW (ref >=40)
LDL Cholesterol (Calc): 90 mg/dL (calc)
Non-HDL Cholesterol (Calc): 117 mg/dL (calc) (ref <130)
Total CHOL/HDL Ratio: 5.2 (calc) — ABNORMAL HIGH (ref <5.0)
Triglycerides: 174 mg/dL — ABNORMAL HIGH (ref <150)

## 2019-09-17 LAB — CBC WITH DIFFERENTIAL/PLATELET
Absolute Monocytes: 504 cells/uL (ref 200–950)
Basophils Absolute: 36 cells/uL (ref 0–200)
Basophils Relative: 0.4 %
Eosinophils Absolute: 81 cells/uL (ref 15–500)
Eosinophils Relative: 0.9 %
HCT: 47.3 % (ref 38.5–50.0)
Hemoglobin: 14.9 g/dL (ref 13.2–17.1)
Lymphs Abs: 2457 cells/uL (ref 850–3900)
MCH: 20.4 pg — ABNORMAL LOW (ref 27.0–33.0)
MCHC: 31.5 g/dL — ABNORMAL LOW (ref 32.0–36.0)
MCV: 64.9 fL — ABNORMAL LOW (ref 80.0–100.0)
Monocytes Relative: 5.6 %
Neutro Abs: 5922 cells/uL (ref 1500–7800)
Neutrophils Relative %: 65.8 %
Platelets: 198 10*3/uL (ref 140–400)
RBC: 7.29 10*6/uL — ABNORMAL HIGH (ref 4.20–5.80)
RDW: 19.8 % — ABNORMAL HIGH (ref 11.0–15.0)
Total Lymphocyte: 27.3 %
WBC: 9 10*3/uL (ref 3.8–10.8)

## 2019-09-17 LAB — COMPLETE METABOLIC PANEL WITH GFR
AG Ratio: 2.9 (calc) — ABNORMAL HIGH (ref 1.0–2.5)
ALT: 28 U/L (ref 9–46)
AST: 27 U/L (ref 10–35)
Albumin: 4.6 g/dL (ref 3.6–5.1)
Alkaline phosphatase (APISO): 67 U/L (ref 35–144)
BUN: 16 mg/dL (ref 7–25)
CO2: 22 mmol/L (ref 20–32)
Calcium: 9.6 mg/dL (ref 8.6–10.3)
Chloride: 105 mmol/L (ref 98–110)
Creat: 1.01 mg/dL (ref 0.70–1.25)
GFR, Est African American: 91 mL/min/{1.73_m2} (ref >=60)
GFR, Est Non African American: 79 mL/min/{1.73_m2} (ref >=60)
Globulin: 1.6 g/dL (calc) — ABNORMAL LOW (ref 1.9–3.7)
Glucose, Bld: 78 mg/dL (ref 65–99)
Potassium: 4.2 mmol/L (ref 3.5–5.3)
Sodium: 136 mmol/L (ref 135–146)
Total Bilirubin: 1.5 mg/dL — ABNORMAL HIGH (ref 0.2–1.2)
Total Protein: 6.2 g/dL (ref 6.1–8.1)

## 2019-09-17 LAB — TSH: TSH: 1.94 mIU/L (ref 0.40–4.50)

## 2019-09-17 LAB — MAGNESIUM: Magnesium: 1.9 mg/dL (ref 1.5–2.5)

## 2019-10-04 ENCOUNTER — Other Ambulatory Visit: Payer: Self-pay | Admitting: Adult Health

## 2019-10-10 ENCOUNTER — Other Ambulatory Visit: Payer: Self-pay | Admitting: Internal Medicine

## 2019-11-19 ENCOUNTER — Other Ambulatory Visit: Payer: Self-pay | Admitting: Internal Medicine

## 2019-11-19 DIAGNOSIS — E349 Endocrine disorder, unspecified: Secondary | ICD-10-CM

## 2019-12-24 NOTE — Progress Notes (Signed)
Annual  Screening/Preventative Visit  & Comprehensive Evaluation & Examination      [name]  presents for a Screening /Preventative Visit & comprehensive evaluation and management of multiple medical co-morbidities.  Patient has been followed for HTN, HLD, Prediabetes, Hypothyroidism, Testosterone Deficiency.  and Vitamin D Deficiency. Patient has hx/o hx/o Gout controlled on his meds.  Also , patient is on Metadate-CD for  ADD with improved focus & concentration. Other problems include hx/o Hereditary Spherocytosis w/o anemia.       HTN predates circa 1998. Patient's BP has been controlled at home.  Today's BP is at goal -  112/64. Patient denies any cardiac symptoms as chest pain, palpitations, shortness of breath, dizziness or ankle swelling.      Patient's hyperlipidemia is controlled with diet. Last lipids were at goal except elevated Trig's:  Lab Results  Component Value Date   CHOL 145 09/16/2019   HDL 28 (L) 09/16/2019   LDLCALC 90 09/16/2019   TRIG 174 (H) 09/16/2019   CHOLHDL 5.2 (H) 09/16/2019       Patient has hx/o prediabetes (A1c 5.8% /2010) and patient denies reactive hypoglycemic symptoms, visual blurring, diabetic polys or paresthesias. Last A1c was Normal & at goal:  Lab Results  Component Value Date   HGBA1C 5.2 06/02/2019        Patient was  dx'd Hypothyroid in Dec 2020 and started on Thyroid Replacement in Feb 2021.      Patient has been on parenteral  Testosterone replacement for hx/o Testosterone Deficiency  with improved Stamina  & sense of well being.         Finally, patient has history of Vitamin D Deficiency ("31" /2008)and last vitamin D was at goal:  Lab Results  Component Value Date   VD25OH 76 06/02/2019    Current Outpatient Medications on File Prior to Visit  Medication Sig  . allopurinol 300 MG tablet Takes 1/2 tablet daily  . atenolol (100 MG  Take 1 tablet Daily for BP  . VITAMIN D 2000 UNITS  Take 2,000 Units  4   times daily.   . clonazePAM  1 MG tablet Takes up to 2 tabs daily prn  . cyclobenzaprine10 MG tablet TAKE 1/2 TO 1 TABLET 2 TO 3 TIMES   . Diclofenac 1 % GEL APPLY 2-4 GMS  2 TO 4 X /DAY  . enalapril20 MG Take 1 tablet Daily for BP  . gabapentin  800 MG tab Take 6 tablets Daily for Chronic Pain  . levothyroxine 50 MCG tab Take 1 tablet daily  . meloxicam  15 MG tab TAKE 1/2 TO 1 TABLET DAILY  . METADATE CD 20 MG CR cap   . mirtazapine  45 MG tablet Take 45 mg by mouth at bedtime.  . Omega-3 FISH OIL 1000 MG CAPS Take 1,000 mg by mouth.   Marland Kitchen PROAIR HFA  inhaler 2 PUFFS  EVERY 6 HRS AS NEEDED   . risperiDONE 2 MG tablet Take 2 mg  at bedtime.  . sertraline (ZOLOFT) 100 MG tablet Take 100 mg by mouth daily.  . tadalafil 20 MG tablet Take 1 tablet daily as needed   . testosterone cypio 200 MG/ML injection Inject 2 ml into  muscle every 2 weeks    Allergies  Allergen Reactions  . Serzone [Nefazodone] Leg cramps      . Trazamine [Trazodone & Diet Manage Prod] confusion       Past Medical History:  Diagnosis Date  . ADD (attention deficit disorder)   . Anemia   . Depression   . Elevated hemoglobin A1c   . Hyperlipidemia   . Hypertension   . Vitamin D deficiency    Health Maintenance  Topic Date Due  . COLONOSCOPY  08/18/2019  . INFLUENZA VACCINE  10/05/2019  . TETANUS/TDAP  03/24/2022  . COVID-19 Vaccine  Completed  . Hepatitis C Screening  Completed  . HIV Screening  Completed   Immunization History  Administered Date(s) Administered  . Influenza Inj Mdck Quad With Preservative 12/06/2017  . Influenza, Seasonal, Injecte, Preservative Fre 12/08/2016  . Influenza,inj,Quad PF,6+ Mos 12/08/2013  . Influenza-Unspecified 12/06/2015, 12/07/2016, 10/22/2018  . Janssen (J&J) SARS-COV-2 Vaccination 05/16/2019  . PPD Test 07/23/2013, 07/24/2014, 08/09/2015, 08/30/2016, 10/23/2017, 11/26/2018  . Pneumococcal-Unspecified 03/06/2009  . Td 03/24/2012   Last Colon -  08/23/2009 - Dr  Kevin Gomez -Recc 10 yr f/u due June 2021  Past Surgical History:  Procedure Laterality Date  . FRACTURE SURGERY    . ORTHOPEDIC SURGERY Left    left foot pinning  . ORTHOPEDIC SURGERY Left    left foot pinning  . ORTHOPEDIC SURGERY Right    right elbow bursectomy  1984  . ORTHOPEDIC SURGERY Right 2002 dr Kevin Gomez   right rotator cuff   Family History  Problem Relation Age of Onset  . Cancer Mother        melonoma  . Hypertension Father   . Aortic stenosis Father    Social History   Socioeconomic History  . Marital status: Married    Spouse name: Kevin Gomez  . Number of children: 2 adopted chinese daughters  Occupational History  .   Tobacco Use  . Smoking status: Never Smoker  . Smokeless tobacco: Never Used  Substance and Sexual Activity  . Alcohol use: No  . Drug use: No  . Sexual activity: Not on file     ROS Constitutional: Denies fever, chills, weight loss/gain, headaches, insomnia,  night sweats or change in appetite. Does c/o fatigue. Eyes: Denies redness, blurred vision, diplopia, discharge, itchy or watery eyes.  ENT: Denies discharge, congestion, post nasal drip, epistaxis, sore throat, earache, hearing loss, dental pain, Tinnitus, Vertigo, Sinus pain or snoring.  Cardio: Denies chest pain, palpitations, irregular heartbeat, syncope, dyspnea, diaphoresis, orthopnea, PND, claudication or edema Respiratory: denies cough, dyspnea, DOE, pleurisy, hoarseness, laryngitis or wheezing.  Gastrointestinal: Denies dysphagia, heartburn, reflux, water brash, pain, cramps, nausea, vomiting, bloating, diarrhea, constipation, hematemesis, melena, hematochezia, jaundice or hemorrhoids Genitourinary: Denies dysuria, frequency, urgency, nocturia, hesitancy, discharge, hematuria or flank pain Musculoskeletal: Denies arthralgia, myalgia, stiffness, Jt. Swelling, pain, limp or strain/sprain. Denies Falls. Skin: Denies puritis, rash, hives, warts, acne, eczema or change in skin  lesion Neuro: No weakness, tremor, incoordination, spasms, paresthesia or pain Psychiatric: Denies confusion, memory loss or sensory loss. Denies Depression. Endocrine: Denies change in weight, skin, hair change, nocturia, and paresthesia, diabetic polys, visual blurring or hyper / hypo glycemic episodes.  Heme/Lymph: No excessive bleeding, bruising or enlarged lymph nodes.  Physical Exam  BP 112/64   Pulse (!) 46   Temp (!) 97 F (36.1 C)   Resp 16   Ht 5' 8.5" (1.74 m)   Wt 172 lb 6.4 oz (78.2 kg)   SpO2 97%   BMI 25.83 kg/m   General Appearance: Well nourished and well groomed and in no apparent distress.  Eyes: PERRLA, EOMs, conjunctiva no swelling or erythema, normal fundi and vessels. Sinuses: No frontal/maxillary tenderness ENT/Mouth: EACs  patent / TMs  nl. Nares clear without erythema, swelling, mucoid exudates. Oral hygiene is good. No erythema, swelling, or exudate. Tongue normal, non-obstructing. Tonsils not swollen or erythematous. Hearing normal.  Neck: Kevin Gomez, thyroid not palpable. No bruits, nodes or JVD. Respiratory: Respiratory effort normal.  BS equal and clear bilateral without rales, rhonci, wheezing or stridor. Cardio: Heart sounds are normal with regular rate and rhythm and no murmurs, rubs or gallops. Peripheral pulses are normal and equal bilaterally without edema. No aortic or femoral bruits. Chest: symmetric with normal excursions and percussion.  Abdomen: Soft, with Nl bowel sounds. Nontender, no guarding, rebound, hernias, masses, or organomegaly.  Lymphatics: Non tender without lymphadenopathy.  Musculoskeletal: Full ROM all peripheral extremities, joint stability, 5/5 strength, and normal gait. Skin: Warm and dry without rashes, lesions, cyanosis, clubbing or  ecchymosis.  Neuro: Cranial nerves intact, reflexes equal bilaterally. Normal muscle tone, no cerebellar symptoms. Sensation intact.  Pysch: Alert and oriented X 3 with normal affect, insight and  judgment appropriate.   Assessment and Plan  1. Annual Preventative/Screening Exam    2. Essential hypertension  - EKG 12-Lead - [place], RETROPERITNL ABD,  LTD - Microalbumin / creatinine urine ratio - CBC with Differential/Platelet - COMPLETE METABOLIC PANEL WITH GFR - Magnesium - TSH - Urinalysis, Routine w reflex microscopic  3. Hyperlipidemia, mixed  - EKG 12-Lead - [place], RETROPERITNL ABD,  LTD - Lipid panel - TSH  4. Abnormal glucose  - EKG 12-Lead - [place], RETROPERITNL ABD,  LTD - Hemoglobin A1c - Insulin, random  5. Vitamin D deficiency  - VITAMIN D 25 Hydroxy  6. Testosterone Deficiency  - Testosterone  7. Hypothyroidism - TSH  8. Idiopathic gout  - Uric acid  9. PreDiabetes  - EKG 12-Lead - [place], RETROPERITNL ABD,  LTD - Hemoglobin A1c - Insulin, random  10. 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 Screening examination for pulmonary tuberculosis  - TB Skin Test  13. Prostate cancer screening  - PSA  14. Screening for ischemic heart disease  - EKG 12-Lead  15. FHx: heart disease  - EKG 12-Lead - [place], RETROPERITNL ABD,  LTD  16. Screening for AAA (aortic abdominal aneurysm)  - [place], RETROPERITNL ABD,  LTD  17. Screening for colorectal cancer  - POC Hemoccult Bld/Stl   18. Fatigue, unspecified type  - Vitamin B12 - Iron,Total/Total Iron Binding Cap - Testosterone  19. Medication management  - Microalbumin / creatinine urine ratio - Uric acid - CBC with Differential/Platelet - COMPLETE METABOLIC PANEL WITH GFR - Magnesium - Lipid panel - TSH - Hemoglobin A1c - Insulin, random - VITAMIN D 25 Hydroxy  - Urinalysis, Routine w reflex microscopic            Patient was counseled in prudent diet, weight control to achieve/maintain BMI less than 25, BP monitoring, regular exercise and medications as discussed.   Discussed med effects and SE's. Routine screening labs and tests as requested with regular follow-up as recommended. Over 40 minutes of exam, counseling, chart review and high complex critical decision making was performed   Kevin Maw, MD

## 2019-12-25 ENCOUNTER — Ambulatory Visit (INDEPENDENT_AMBULATORY_CARE_PROVIDER_SITE_OTHER): Payer: BC Managed Care – PPO | Admitting: Internal Medicine

## 2019-12-25 ENCOUNTER — Encounter: Payer: Self-pay | Admitting: Internal Medicine

## 2019-12-25 ENCOUNTER — Other Ambulatory Visit: Payer: Self-pay

## 2019-12-25 VITALS — BP 112/64 | HR 46 | Temp 97.0°F | Resp 16 | Ht 68.5 in | Wt 172.4 lb

## 2019-12-25 DIAGNOSIS — Z1329 Encounter for screening for other suspected endocrine disorder: Secondary | ICD-10-CM | POA: Diagnosis not present

## 2019-12-25 DIAGNOSIS — Z111 Encounter for screening for respiratory tuberculosis: Secondary | ICD-10-CM | POA: Diagnosis not present

## 2019-12-25 DIAGNOSIS — Z79899 Other long term (current) drug therapy: Secondary | ICD-10-CM | POA: Diagnosis not present

## 2019-12-25 DIAGNOSIS — R7309 Other abnormal glucose: Secondary | ICD-10-CM

## 2019-12-25 DIAGNOSIS — M1000 Idiopathic gout, unspecified site: Secondary | ICD-10-CM

## 2019-12-25 DIAGNOSIS — E039 Hypothyroidism, unspecified: Secondary | ICD-10-CM

## 2019-12-25 DIAGNOSIS — R5383 Other fatigue: Secondary | ICD-10-CM

## 2019-12-25 DIAGNOSIS — Z131 Encounter for screening for diabetes mellitus: Secondary | ICD-10-CM | POA: Diagnosis not present

## 2019-12-25 DIAGNOSIS — Z13220 Encounter for screening for lipoid disorders: Secondary | ICD-10-CM | POA: Diagnosis not present

## 2019-12-25 DIAGNOSIS — Z1211 Encounter for screening for malignant neoplasm of colon: Secondary | ICD-10-CM

## 2019-12-25 DIAGNOSIS — Z8249 Family history of ischemic heart disease and other diseases of the circulatory system: Secondary | ICD-10-CM

## 2019-12-25 DIAGNOSIS — E782 Mixed hyperlipidemia: Secondary | ICD-10-CM

## 2019-12-25 DIAGNOSIS — Z0000 Encounter for general adult medical examination without abnormal findings: Secondary | ICD-10-CM

## 2019-12-25 DIAGNOSIS — E559 Vitamin D deficiency, unspecified: Secondary | ICD-10-CM | POA: Diagnosis not present

## 2019-12-25 DIAGNOSIS — R350 Frequency of micturition: Secondary | ICD-10-CM | POA: Diagnosis not present

## 2019-12-25 DIAGNOSIS — Z136 Encounter for screening for cardiovascular disorders: Secondary | ICD-10-CM

## 2019-12-25 DIAGNOSIS — Z130 Encounter for screening for diseases of the blood and blood-forming organs and certain disorders involving the immune mechanism: Secondary | ICD-10-CM | POA: Diagnosis not present

## 2019-12-25 DIAGNOSIS — I10 Essential (primary) hypertension: Secondary | ICD-10-CM | POA: Diagnosis not present

## 2019-12-25 DIAGNOSIS — Z0001 Encounter for general adult medical examination with abnormal findings: Secondary | ICD-10-CM

## 2019-12-25 DIAGNOSIS — D580 Hereditary spherocytosis: Secondary | ICD-10-CM

## 2019-12-25 DIAGNOSIS — N401 Enlarged prostate with lower urinary tract symptoms: Secondary | ICD-10-CM

## 2019-12-25 DIAGNOSIS — F900 Attention-deficit hyperactivity disorder, predominantly inattentive type: Secondary | ICD-10-CM

## 2019-12-25 DIAGNOSIS — Z1389 Encounter for screening for other disorder: Secondary | ICD-10-CM

## 2019-12-25 DIAGNOSIS — E291 Testicular hypofunction: Secondary | ICD-10-CM

## 2019-12-25 DIAGNOSIS — Z125 Encounter for screening for malignant neoplasm of prostate: Secondary | ICD-10-CM | POA: Diagnosis not present

## 2019-12-25 NOTE — Patient Instructions (Signed)

## 2019-12-26 LAB — CBC WITH DIFFERENTIAL/PLATELET
Absolute Monocytes: 584 cells/uL (ref 200–950)
Basophils Absolute: 40 cells/uL (ref 0–200)
Basophils Relative: 0.4 %
Eosinophils Absolute: 99 cells/uL (ref 15–500)
Eosinophils Relative: 1 %
HCT: 43.9 % (ref 38.5–50.0)
Hemoglobin: 13.6 g/dL (ref 13.2–17.1)
Lymphs Abs: 3020 cells/uL (ref 850–3900)
MCH: 20.5 pg — ABNORMAL LOW (ref 27.0–33.0)
MCHC: 31 g/dL — ABNORMAL LOW (ref 32.0–36.0)
MCV: 66.1 fL — ABNORMAL LOW (ref 80.0–100.0)
Monocytes Relative: 5.9 %
Neutro Abs: 6158 cells/uL (ref 1500–7800)
Neutrophils Relative %: 62.2 %
Platelets: 207 10*3/uL (ref 140–400)
RBC: 6.64 10*6/uL — ABNORMAL HIGH (ref 4.20–5.80)
RDW: 19.1 % — ABNORMAL HIGH (ref 11.0–15.0)
Total Lymphocyte: 30.5 %
WBC: 9.9 10*3/uL (ref 3.8–10.8)

## 2019-12-26 LAB — LIPID PANEL
Cholesterol: 153 mg/dL (ref <200)
HDL: 24 mg/dL — ABNORMAL LOW (ref >=40)
LDL Cholesterol (Calc): 88 mg/dL (calc)
Non-HDL Cholesterol (Calc): 129 mg/dL (calc) (ref <130)
Total CHOL/HDL Ratio: 6.4 (calc) — ABNORMAL HIGH (ref <5.0)
Triglycerides: 337 mg/dL — ABNORMAL HIGH (ref <150)

## 2019-12-26 LAB — COMPLETE METABOLIC PANEL WITH GFR
AG Ratio: 2.7 (calc) — ABNORMAL HIGH (ref 1.0–2.5)
ALT: 33 U/L (ref 9–46)
AST: 36 U/L — ABNORMAL HIGH (ref 10–35)
Albumin: 4.3 g/dL (ref 3.6–5.1)
Alkaline phosphatase (APISO): 59 U/L (ref 35–144)
BUN/Creatinine Ratio: 14 (calc) (ref 6–22)
BUN: 18 mg/dL (ref 7–25)
CO2: 31 mmol/L (ref 20–32)
Calcium: 9.9 mg/dL (ref 8.6–10.3)
Chloride: 100 mmol/L (ref 98–110)
Creat: 1.26 mg/dL — ABNORMAL HIGH (ref 0.70–1.25)
GFR, Est African American: 70 mL/min/{1.73_m2} (ref >=60)
GFR, Est Non African American: 60 mL/min/{1.73_m2} (ref >=60)
Globulin: 1.6 g/dL (calc) — ABNORMAL LOW (ref 1.9–3.7)
Glucose, Bld: 83 mg/dL (ref 65–99)
Potassium: 4.3 mmol/L (ref 3.5–5.3)
Sodium: 138 mmol/L (ref 135–146)
Total Bilirubin: 1.7 mg/dL — ABNORMAL HIGH (ref 0.2–1.2)
Total Protein: 5.9 g/dL — ABNORMAL LOW (ref 6.1–8.1)

## 2019-12-26 LAB — URINALYSIS, ROUTINE W REFLEX MICROSCOPIC
Bacteria, UA: NONE SEEN /HPF
Bilirubin Urine: NEGATIVE
Glucose, UA: NEGATIVE
Hgb urine dipstick: NEGATIVE
Nitrite: NEGATIVE
RBC / HPF: NONE SEEN /HPF (ref 0–2)
Specific Gravity, Urine: 1.028 (ref 1.001–1.03)
Squamous Epithelial / HPF: NONE SEEN /HPF (ref <=5)
pH: <=5 (ref 5.0–8.0)

## 2019-12-26 LAB — PSA: PSA: 0.71 ng/mL (ref <=4.0)

## 2019-12-26 LAB — HEMOGLOBIN A1C
Hgb A1c MFr Bld: 4.9 % of total Hgb (ref <5.7)
Mean Plasma Glucose: 94 (calc)
eAG (mmol/L): 5.2 (calc)

## 2019-12-26 LAB — MICROALBUMIN / CREATININE URINE RATIO
Creatinine, Urine: 450 mg/dL — ABNORMAL HIGH (ref 20–320)
Microalb Creat Ratio: 5 mcg/mg creat (ref <30)
Microalb, Ur: 2.4 mg/dL

## 2019-12-26 LAB — TESTOSTERONE: Testosterone: 1880 ng/dL — ABNORMAL HIGH (ref 250–827)

## 2019-12-26 LAB — MAGNESIUM: Magnesium: 2 mg/dL (ref 1.5–2.5)

## 2019-12-26 LAB — VITAMIN D 25 HYDROXY (VIT D DEFICIENCY, FRACTURES): Vit D, 25-Hydroxy: 67 ng/mL (ref 30–100)

## 2019-12-26 LAB — INSULIN, RANDOM: Insulin: 3.4 u[IU]/mL

## 2019-12-26 LAB — URIC ACID: Uric Acid, Serum: 7.2 mg/dL (ref 4.0–8.0)

## 2019-12-26 LAB — TSH: TSH: 2.17 mIU/L (ref 0.40–4.50)

## 2019-12-26 LAB — IRON, TOTAL/TOTAL IRON BINDING CAP
%SAT: 46 % (calc) (ref 20–48)
Iron: 159 ug/dL (ref 50–180)
TIBC: 345 mcg/dL (calc) (ref 250–425)

## 2019-12-26 LAB — VITAMIN B12: Vitamin B-12: 505 pg/mL (ref 200–1100)

## 2019-12-26 LAB — CBC MORPHOLOGY

## 2019-12-26 NOTE — Progress Notes (Signed)
========================================================== -   Test results slightly outside the reference range are not unusual. If there is anything important, I will review this with you,  otherwise it is considered normal test values.  If you have further questions,  please do not hesitate to contact me at the office or via My Chart.  ==========================================================  -  Iron & Vitamin B12 levels Both Normal & OK  ==========================================================  -  PSA - Low - Great  ==========================================================  -  Testosterone high from recent shot ==========================================================  -  Uric Acid / Gout test - Normal & OK  ==========================================================  -  Total Chol = 153 and LDL Chol = 88 - Both  Excellent   - Very low risk for Heart Attack  / Stroke ==========================================================  - But Triglycerides (   337   ) or fats in blood are too high  (goal is less than 150)    - Recommend avoid fried & greasy foods,  sweets / candy,   - Avoid white rice  (brown or wild rice or Quinoa is OK),   - Avoid white potatoes  (sweet potatoes are OK)   - Avoid anything made from white flour  - bagels, doughnuts, rolls, buns, biscuits, white and   wheat breads, pizza crust and traditional  pasta made of white flour & egg white  - (vegetarian pasta or spinach or wheat pasta is OK).    - Multi-grain bread is OK - like multi-grain flat bread or  sandwich thins.   - Avoid alcohol in excess.   - Exercise is also important. ==========================================================  -  A1c - Normal - Great - No Diabetes !  ==========================================================  -  Vitamin D = 67 - Great - Please keep dose same ==========================================================  -  All Else - CBC - Kidneys -  Electrolytes - Liver - Magnesium & Thyroid    - all  Normal / OK ==========================================================   - Keep up the [place] Work  ! ==========================================================

## 2019-12-29 LAB — TB SKIN TEST
Induration: 0 mm
TB Skin Test: NEGATIVE

## 2020-02-01 ENCOUNTER — Other Ambulatory Visit: Payer: Self-pay | Admitting: Internal Medicine

## 2020-02-11 ENCOUNTER — Other Ambulatory Visit: Payer: Self-pay | Admitting: Internal Medicine

## 2020-02-17 ENCOUNTER — Other Ambulatory Visit: Payer: Self-pay | Admitting: Internal Medicine

## 2020-03-27 ENCOUNTER — Other Ambulatory Visit: Payer: Self-pay | Admitting: Internal Medicine

## 2020-04-13 ENCOUNTER — Ambulatory Visit: Payer: BC Managed Care – PPO | Admitting: Adult Health

## 2020-04-22 NOTE — Progress Notes (Signed)
FOLLOW UP  Assessment and Plan:   Hypertension At goal; continue medications Monitor blood pressure at home; call if consistently greater than 130/80 Continue DASH diet.   Reminder to go to the ER if any CP, SOB, nausea, dizziness, severe HA, changes vision/speech, left arm numbness and tingling and jaw pain.  Cholesterol LDL at goal, on fish oil for trigs, remains modestly elevation - patient prefers to avoid adding another med, discussed lifestyle at length  Denies alcohol intake  Continue diet and exercise.  Check lipid panel.   Other abnormal glucose  Recent A1Cs at goal Discussed diet/exercise, weight management  Defer A1C; check CMP  Overweight - with comorbidities Long discussion about weight loss, diet, and exercise Discussed ideal weight for height (below 170)  Patient will work on exercise, reducing processed foods Will follow up in 3 months  Vitamin D Def At goal at recent check; continue to recommend supplementation for goal of 60-100 Defer vitamin D level  Hereditary spherocytosis CBC - monitor for anemia  Hypogonadism - continue replacement therapy, check testosterone levels as needed. Defer today as just had injection.    Hypothyroidism continue medications the same pending lab results reminded to take on an empty stomach 30-20mins before food.  check TSH level  Bipolar depression/ADD Is continuing to follow up with Dr. Azucena Fallen, appears to be doing well on current medications.  Encouraged to work on stress, mindfulness, start exercising, and possibly follow up with counseling.    Continue diet and meds as discussed. Further disposition pending results of labs. Discussed med's effects and SE's.   Over 30 minutes of exam, counseling, chart review, and critical decision making was performed.   Future Appointments  Date Time Provider Department Center  07/12/2020  9:30 AM [name], MD [facility] None  12/30/2020  3:00 PM [name], MD  [facility] None    ----------------------------------------------------------------------------------------------------------------------  HPI 65 y.o. male  presents for 3 month follow up on hypertension, cholesterol, glucose management, weight, hypogonadism, gout and vitamin D deficiency. Gout is controlled on meds.   Patient is followed for hx/o bipolar depression and ADD at [name] by [name]. He is on risperidone, states he is doing well. He is also prescribed methylphenidate 20 mg CR for add, has klonopin 1 mg PRN which he reports uses sparingly, a few per week.   He has trigeminal neuralgia, takes gabapentin 800 mg, typically TID but up to 6 tabs/day when severe, has been taking this for many years per patient.   BMI is Body mass index is 26.52 kg/m., he has been working on diet, watching portions particularly in the evening, doing more plant based, but has not been exercising much, has been tired due to work, does walk the dog. Weight loss goal of <170 lb.  Wt Readings from Last 3 Encounters:  04/23/20 177 lb (80.3 kg)  12/25/19 172 lb 6.4 oz (78.2 kg)  09/16/19 172 lb 6.4 oz (78.2 kg)   His blood pressure has been controlled at home (120-130s/80s), today their BP is BP: 124/82  He was not been working out. He denies chest pain, shortness of breath, dizziness.   He is not on cholesterol medication, takes omega 3 supplement, was prescribed fenofibrate but never picked up and denies myalgias. His LDL cholesterol is at goal. Triglycerides remain elevated. He denies alcohol intake. The cholesterol last visit was:   Lab Results  Component Value Date   CHOL 153 12/25/2019   HDL 24 (L) 12/25/2019   LDLCALC  88 12/25/2019   TRIG 337 (H) 12/25/2019   CHOLHDL 6.4 (H) 12/25/2019    He has been working on diet and exercise for glucose management, and denies hyperglycemia, hypoglycemia , increased appetite, nausea, paresthesia of the feet, polydipsia and polyuria.  Last A1C in the office was:  Lab Results  Component Value Date   HGBA1C 4.9 12/25/2019   He is on thyroid medication. His medication was not changed last visit.  Currently taking 50 mcg when he gets home (no food for 4 hours),  Lab Results  Component Value Date   TSH 2.17 12/25/2019   Patient is on Vitamin D supplement, 8000 IU daily, and at goal:    Lab Results  Component Value Date   VD25OH 67 12/25/2019     He has a history of testosterone deficiency and is on testosterone replacement. Taking 1 cc weekly, last injection was 04/21/2020.Marland Kitchen He states that the testosterone helps with his energy, libido, muscle mass. No weak stream, no trouble with nocturia, frequency. He is on cialis 20 mg PRN for ED. He reports had injection just prior to check last time.  Lab Results  Component Value Date   TESTOSTERONE 1,880 (H) 12/25/2019     Current Medications:  Current Outpatient Medications on File Prior to Visit  Medication Sig  . enalapril (VASOTEC) 20 MG tablet TAKE 1 TABLET DAILY FOR BLOOD PRESSURE  . gabapentin (NEURONTIN) 800 MG tablet TAKE 6 TABLETS DAILY AS DIRECTED FOR CHRONIC PAIN  . levothyroxine (SYNTHROID) 50 MCG tablet Take   1 tablet   Daily  on an empty stomach with only water for 30 minutes & no Antacid meds, Calcium or Magnesium for 4 hours & avoid Biotin  . meloxicam (MOBIC) 15 MG tablet TAKE 1/2 TO 1 TABLET DAILY WITH FOOD AS NEEDED FOR PAIN & INFLAMMATION  . methylphenidate (METADATE CD) 20 MG CR capsule   . mirtazapine (REMERON) 45 MG tablet Take 45 mg by mouth at bedtime.  Marland Kitchen NEEDLE, DISP, 21 G (YALE DISP NEEDLES 21GX1") 21G X 1" MISC Use with Depo-Testosterone injection  . Omega-3 Fatty Acids (FISH OIL) 1000 MG CAPS Take 1,000 mg by mouth.   Marland Kitchen PROAIR HFA 108 (90 Base) MCG/ACT inhaler INHALE 2 PUFFS INTO THE LUNGS EVERY 6 HOURS AS NEEDED FOR WHEEZING OR SHORTNESS OF BREATH  . risperiDONE (RISPERDAL) 2 MG tablet Take 2 mg by mouth at bedtime.  . sertraline (ZOLOFT) 100 MG  tablet Take 100 mg by mouth daily.  . SYRINGE-NEEDLE, DISP, 3 ML (B-D 3CC LUER-LOK SYR 23GX1") 23G X 1" 3 ML MISC Inject Depo-  Testosterone into muscle every week  . tadalafil (CIALIS) 20 MG tablet Take 1 tablet (20 mg total) by mouth daily as needed for erectile dysfunction.  Marland Kitchen testosterone cypionate (DEPOTESTOSTERONE CYPIONATE) 200 MG/ML injection Inject 2 ml into  muscle every 2 weeks  . allopurinol (ZYLOPRIM) 300 MG tablet TAKE 1 TABLET BY MOUTH EVERY DAY (Patient taking differently: Takes 1/2 tablet daily)  . atenolol (TENORMIN) 100 MG tablet Take 1 tablet Daily for BP  . Cholecalciferol (VITAMIN D) 2000 UNITS tablet Take 2,000 Units by mouth 4 (four) times daily.   . clonazePAM (KLONOPIN) 1 MG tablet Take 1 mg by mouth 2 (two) times daily as needed. Takes up to 2 tabs daily prn  . cyclobenzaprine (FLEXERIL) 10 MG tablet TAKE 1/2 TO 1 TABLET 2 TO 3 TIMES DAILY FOR MUSCLE SPASM (Patient not taking: Reported on 04/23/2020)  . diclofenac Sodium (VOLTAREN) 1 %  GEL APPLY 2 TO 4 GRAMS TOPICALLY TO PAINFUL JOINTS 2 TO 4 X /DAY (Patient not taking: Reported on 04/23/2020)   No current facility-administered medications on file prior to visit.     Allergies:  Allergies  Allergen Reactions  . Serzone [Nefazodone]     Leg cramps  . Trazamine [Trazodone & Diet Manage Prod] Other (See Comments)    confusion     Medical History:  Past Medical History:  Diagnosis Date  . ADD (attention deficit disorder)   . Anemia   . Depression   . Elevated hemoglobin A1c   . Hyperlipidemia   . Hypertension   . Vitamin D deficiency    Family history- Reviewed and unchanged Social history- Reviewed and unchanged   Review of Systems:  Review of Systems  Constitutional: Negative.  Negative for malaise/fatigue.  HENT: Negative for congestion, hearing loss, sore throat and tinnitus.   Eyes: Negative for blurred vision.  Respiratory: Negative for cough, shortness of breath and wheezing.   Cardiovascular:  Negative for chest pain, palpitations, claudication and leg swelling.  Gastrointestinal: Negative for abdominal pain, blood in stool, constipation, diarrhea, heartburn, melena, nausea and vomiting.  Genitourinary: Negative.   Musculoskeletal: Negative for back pain, joint pain and myalgias.  Skin: Negative.  Negative for rash.  Neurological: Negative for dizziness, tingling, tremors, sensory change, weakness and headaches.  Endo/Heme/Allergies: Negative.   Psychiatric/Behavioral: Negative for depression, substance abuse and suicidal ideas. The patient is not nervous/anxious and does not have insomnia.     Physical Exam: BP 124/82   Pulse (!) 48   Temp (!) 97.3 F (36.3 C)   Wt 177 lb (80.3 kg)   SpO2 96%   BMI 26.52 kg/m  Wt Readings from Last 3 Encounters:  04/23/20 177 lb (80.3 kg)  12/25/19 172 lb 6.4 oz (78.2 kg)  09/16/19 172 lb 6.4 oz (78.2 kg)   General Appearance: Well nourished, in no apparent distress. Eyes: PERRLA, EOMs, conjunctiva no swelling or erythema Sinuses: No Frontal/maxillary tenderness ENT/Mouth: Ext aud canals clear, TMs without erythema, bulging. No erythema, swelling, or exudate on post pharynx.  Tonsils not swollen or erythematous. Hearing normal.  Neck: Supple, thyroid normal.  Respiratory: Respiratory effort normal, BS equal bilaterally without rales, rhonchi, wheezing or stridor.  Cardio: RRR with no MRGs. Brisk peripheral pulses without edema.  Abdomen: Soft, + BS.  Non tender, no guarding, rebound, hernias, masses. Lymphatics: Non tender without lymphadenopathy.  Musculoskeletal: Full ROM, 5/5 strength, Normal gait.  Skin: Warm, dry without rashes, lesions, ecchymosis.  Neuro: Cranial nerves intact. No cerebellar symptoms.  Psych: Awake and oriented X 3, flat affect, Insight and Judgment appropriate.    Dan Maker, NP 8:48 AM [facility]

## 2020-04-23 ENCOUNTER — Ambulatory Visit (INDEPENDENT_AMBULATORY_CARE_PROVIDER_SITE_OTHER): Payer: BC Managed Care – PPO | Admitting: Adult Health

## 2020-04-23 ENCOUNTER — Other Ambulatory Visit: Payer: Self-pay

## 2020-04-23 ENCOUNTER — Encounter: Payer: Self-pay | Admitting: Adult Health

## 2020-04-23 VITALS — BP 124/82 | HR 48 | Temp 97.3°F | Wt 177.0 lb

## 2020-04-23 DIAGNOSIS — F900 Attention-deficit hyperactivity disorder, predominantly inattentive type: Secondary | ICD-10-CM

## 2020-04-23 DIAGNOSIS — Z6825 Body mass index (BMI) 25.0-25.9, adult: Secondary | ICD-10-CM

## 2020-04-23 DIAGNOSIS — E291 Testicular hypofunction: Secondary | ICD-10-CM | POA: Diagnosis not present

## 2020-04-23 DIAGNOSIS — E782 Mixed hyperlipidemia: Secondary | ICD-10-CM | POA: Diagnosis not present

## 2020-04-23 DIAGNOSIS — Z79899 Other long term (current) drug therapy: Secondary | ICD-10-CM | POA: Diagnosis not present

## 2020-04-23 DIAGNOSIS — R7309 Other abnormal glucose: Secondary | ICD-10-CM

## 2020-04-23 DIAGNOSIS — F319 Bipolar disorder, unspecified: Secondary | ICD-10-CM

## 2020-04-23 DIAGNOSIS — E039 Hypothyroidism, unspecified: Secondary | ICD-10-CM | POA: Diagnosis not present

## 2020-04-23 DIAGNOSIS — I10 Essential (primary) hypertension: Secondary | ICD-10-CM

## 2020-04-23 DIAGNOSIS — E559 Vitamin D deficiency, unspecified: Secondary | ICD-10-CM

## 2020-04-23 DIAGNOSIS — D580 Hereditary spherocytosis: Secondary | ICD-10-CM

## 2020-04-23 MED ORDER — "NEEDLE (DISP) 21G X 1"" MISC": 1 refills | Status: DC

## 2020-04-23 NOTE — Patient Instructions (Addendum)
Goals    . Exercise 150 min/wk Moderate Activity    . Weight (lb) < 170 lb (77.1 kg)         Constipation, Adult Constipation is when a person has fewer than three bowel movements in a week, has difficulty having a bowel movement, or has stools (feces) that are dry, hard, or larger than normal. Constipation may be caused by an underlying condition. It may become worse with age if a person takes certain medicines and does not take in enough fluids. Follow these instructions at home: Eating and drinking  Eat foods that have a lot of fiber, such as beans, whole grains, and fresh fruits and vegetables.  Limit foods that are low in fiber and high in fat and processed sugars, such as fried or sweet foods. These include french fries, hamburgers, cookies, candies, and soda.  Drink enough fluid to keep your urine pale yellow.   General instructions  Exercise regularly or as told by your health care provider. Try to do 150 minutes of moderate exercise each week.  Use the bathroom when you have the urge to go. Do not hold it in.  Take over-the-counter and prescription medicines only as told by your health care provider. This includes any fiber supplements.  During bowel movements: ? Practice deep breathing while relaxing the lower abdomen. ? Practice pelvic floor relaxation.  Watch your condition for any changes. Let your health care provider know about them.  Keep all follow-up visits as told by your health care provider. This is important. Contact a health care provider if:  You have pain that gets worse.  You have a fever.  You do not have a bowel movement after 4 days.  You vomit.  You are not hungry or you lose weight.  You are bleeding from the opening between the buttocks (anus).  You have thin, pencil-like stools. Get help right away if:  You have a fever and your symptoms suddenly get worse.  You leak stool or have blood in your stool.  Your abdomen is  bloated.  You have severe pain in your abdomen.  You feel dizzy or you faint. Summary  Constipation is when a person has fewer than three bowel movements in a week, has difficulty having a bowel movement, or has stools (feces) that are dry, hard, or larger than normal.  Eat foods that have a lot of fiber, such as beans, whole grains, and fresh fruits and vegetables.  Drink enough fluid to keep your urine pale yellow.  Take over-the-counter and prescription medicines only as told by your health care provider. This includes any fiber supplements. This information is not intended to replace advice given to you by your health care provider. Make sure you discuss any questions you have with your health care provider. Document Revised: 01/08/2019 Document Reviewed: 01/08/2019 Elsevier Patient Education  2021 [employer].

## 2020-04-24 LAB — CBC WITH DIFFERENTIAL/PLATELET
Absolute Monocytes: 530 cells/uL (ref 200–950)
Basophils Absolute: 30 cells/uL (ref 0–200)
Basophils Relative: 0.3 %
Eosinophils Absolute: 80 cells/uL (ref 15–500)
Eosinophils Relative: 0.8 %
HCT: 46.1 % (ref 38.5–50.0)
Hemoglobin: 14.4 g/dL (ref 13.2–17.1)
Lymphs Abs: 2830 cells/uL (ref 850–3900)
MCH: 20.9 pg — ABNORMAL LOW (ref 27.0–33.0)
MCHC: 31.2 g/dL — ABNORMAL LOW (ref 32.0–36.0)
MCV: 66.8 fL — ABNORMAL LOW (ref 80.0–100.0)
Monocytes Relative: 5.3 %
Neutro Abs: 6530 cells/uL (ref 1500–7800)
Neutrophils Relative %: 65.3 %
Platelets: 160 10*3/uL (ref 140–400)
RBC: 6.9 10*6/uL — ABNORMAL HIGH (ref 4.20–5.80)
RDW: 19.3 % — ABNORMAL HIGH (ref 11.0–15.0)
Total Lymphocyte: 28.3 %
WBC: 10 10*3/uL (ref 3.8–10.8)

## 2020-04-24 LAB — COMPLETE METABOLIC PANEL WITH GFR
AG Ratio: 2.6 (calc) — ABNORMAL HIGH (ref 1.0–2.5)
ALT: 27 U/L (ref 9–46)
AST: 33 U/L (ref 10–35)
Albumin: 4.6 g/dL (ref 3.6–5.1)
Alkaline phosphatase (APISO): 55 U/L (ref 35–144)
BUN: 16 mg/dL (ref 7–25)
CO2: 27 mmol/L (ref 20–32)
Calcium: 9.5 mg/dL (ref 8.6–10.3)
Chloride: 103 mmol/L (ref 98–110)
Creat: 1.21 mg/dL (ref 0.70–1.25)
GFR, Est African American: 73 mL/min/{1.73_m2} (ref >=60)
GFR, Est Non African American: 63 mL/min/{1.73_m2} (ref >=60)
Globulin: 1.8 g/dL (calc) — ABNORMAL LOW (ref 1.9–3.7)
Glucose, Bld: 68 mg/dL (ref 65–99)
Potassium: 4.8 mmol/L (ref 3.5–5.3)
Sodium: 140 mmol/L (ref 135–146)
Total Bilirubin: 1.5 mg/dL — ABNORMAL HIGH (ref 0.2–1.2)
Total Protein: 6.4 g/dL (ref 6.1–8.1)

## 2020-04-24 LAB — LIPID PANEL
Cholesterol: 139 mg/dL (ref <200)
HDL: 26 mg/dL — ABNORMAL LOW (ref >=40)
LDL Cholesterol (Calc): 82 mg/dL (calc)
Non-HDL Cholesterol (Calc): 113 mg/dL (calc) (ref <130)
Total CHOL/HDL Ratio: 5.3 (calc) — ABNORMAL HIGH (ref <5.0)
Triglycerides: 214 mg/dL — ABNORMAL HIGH (ref <150)

## 2020-04-24 LAB — TSH: TSH: 1.6 mIU/L (ref 0.40–4.50)

## 2020-04-24 LAB — MAGNESIUM: Magnesium: 2.1 mg/dL (ref 1.5–2.5)

## 2020-05-11 ENCOUNTER — Other Ambulatory Visit: Payer: Self-pay | Admitting: Internal Medicine

## 2020-06-07 ENCOUNTER — Other Ambulatory Visit: Payer: Self-pay | Admitting: Internal Medicine

## 2020-06-13 ENCOUNTER — Other Ambulatory Visit: Payer: Self-pay | Admitting: Internal Medicine

## 2020-06-19 ENCOUNTER — Other Ambulatory Visit: Payer: Self-pay | Admitting: Internal Medicine

## 2020-06-19 DIAGNOSIS — E349 Endocrine disorder, unspecified: Secondary | ICD-10-CM

## 2020-07-11 ENCOUNTER — Encounter: Payer: Self-pay | Admitting: Internal Medicine

## 2020-07-11 NOTE — Progress Notes (Signed)
Future Appointments  Date Time Provider Department Center  07/12/2020  9:30 AM [name], MD [facility] None  12/30/2020  3:00 PM [name], MD [facility] None    History of Present Illness:       This very nice 65 y.o.m [name] presents for a 6 month follow up with HTN, HLD, Pre-Diabetes, Hypothyroidism, Testosterone Deficiency  and Vitamin D Deficiency.  Patient has hx/o Gout controlled on his meds. Patient is on Metadate-CD for ADD with improved focus & Concentration.  Patient also has hx/o Hereditary Spherocytosis w/o anemia.        Patient is treated for HTN (1998) & BP has been controlled at home. Today's BP is at goal - 110/82. Patient has had no complaints of any cardiac type chest pain, palpitations, dyspnea / orthopnea / PND, dizziness, claudication, or dependent edema.       Hyperlipidemia is controlled with diet & meds. Patient denies myalgias or other med SE's. Last Lipids were at goal except elevated Trig's:  Lab Results  Component Value Date   CHOL 139 04/23/2020   HDL 26 (L) 04/23/2020   LDLCALC 82 04/23/2020   TRIG 214 (H) 04/23/2020   CHOLHDL 5.3 (H) 04/23/2020     Also, the patient has history of PreDiabetes (A1c 5.8%/2010) and has had no symptoms of reactive hypoglycemia, diabetic polys, paresthesias or visual blurring.  Last A1c was normal & at goal:  Lab Results  Component Value Date   HGBA1C 4.9 12/25/2019        Patient was  dx'd Hypothyroid  and started on Thyroid Replacement in Feb 2021. Patient also has been on parenteral  Testosterone replacement for hx/o Testosterone Deficiency with improved Stamina &sense of well being.                                            Patient has hx/o Testosterone Deficiency has been on parenteral   replacement with improved Stamina &sense of well being.        Further, the patient also has history of Vitamin D Deficiency ("31" /2008) and supplements vitamin D without any suspected side-effects.  Last vitamin D was at goal:  Lab Results  Component Value Date   VD25OH 67 12/25/2019     Current Outpatient Medications on File Prior to Visit  Medication Sig  . allopurinol 300 MG tablet Takes 1/2 tablet daily)  . atenolol 100 MG tablet TAKE 1 TABLET DAILY   . VITAMIN D 2000 UNITS  Take 2,000 Units  4  times daily.   . clonazePAM  1 MG tablet Take 2 times daily as needed. Takes up to 2 tabs daily prn  . cyclobenzaprine 10 MG tablet TAKE 1/2 TO 1 TABLET 2 TO 3 TIMES DAILY FOR MUSCLE SPASM  . diclofenac ) 1 % GEL APPLY 2 TO 4 GRAMS TOPICALLY TO PAINFUL JOINTS 2 TO 4 X /DAY  . enalapril  20 MG tablet TAKE 1 TABLET DAILY FOR BLOOD PRESSURE  . gabapentin ( 800 MG tablet Take 6 tablets Daily as Directed for Chronic Pain  . Levothyroxine 50 MCG tablet Take  1 tablet  Daily   . meloxicam (MOBIC) 15 MG tablet TAKE 1/2 TO 1 TABLET DAILY WITH FOOD AS NEEDED   . methylphenidate (METADATE CD) 20 MG CR capsule   . mirtazapine (REMERON) 45 MG tablet Take 45  mg by mouth at bedtime.  . Omega-3 FISH OIL 1000 MG CAPS Take 1,000 mg by mouth.   . risperiDONE (RISPERDAL) 2 MG tablet Take 2 mg by mouth at bedtime.  . Sertraline 100 MG tablet Take 100 mg by mouth daily.  . tadalafil (CIALIS) 20 MG tablet Take 1 tablet  daily as needed for erectile dysfunction.  Marland Kitchen testosterone cypio 200 MG/ML injection INJECT 2 ML INTO MUSCLE EVERY 2 WEEKS     Allergies  Allergen Reactions  . Serzone [Nefazodone] Leg cramps      . Trazamine [Trazodone & Diet Manage Prod] confusion        PMHx:   Past Medical History:  Diagnosis Date  . ADD (attention deficit disorder)   . Anemia   . Depression   . Elevated hemoglobin A1c   . Hyperlipidemia   . Hypertension   . Vitamin D deficiency      Immunization History  Administered Date(s) Administered  . Influenza Inj Mdck Quad  12/06/2017  . Influenza, Seasonal,  12/08/2016  . Influenza,inj,Quad PF,6+ Mos 12/08/2013  . Influenza- 12/06/2015, 12/07/2016,  10/22/2018, 12/05/2019  . Janssen (J&J) SARS-COV-2 Carolynn Comment 05/16/2019  . PPD Test 07/23/2013, 07/24/2014, 08/09/2015, 08/30/2016, 10/23/2017, 11/26/2018, 12/25/2019  . Pneumococcal-23 03/06/2009  . Td 03/24/2012     Past Surgical History:  Procedure Laterality Date  . FRACTURE SURGERY    . ORTHOPEDIC SURGERY Left    left foot pinning  . ORTHOPEDIC SURGERY Left    left foot pinning  . ORTHOPEDIC SURGERY Right    right elbow bursectomy  1984  . ORTHOPEDIC SURGERY Right 2002 dr supple   right rotator cuff    FHx:    Reviewed / unchanged  SHx:    Reviewed / unchanged   Systems Review:  Constitutional: Denies fever, chills, wt changes, headaches, insomnia, fatigue, night sweats, change in appetite. Eyes: Denies redness, blurred vision, diplopia, discharge, itchy, watery eyes.  ENT: Denies discharge, congestion, post nasal drip, epistaxis, sore throat, earache, hearing loss, dental pain, tinnitus, vertigo, sinus pain, snoring.  CV: Denies chest pain, palpitations, irregular heartbeat, syncope, dyspnea, diaphoresis, orthopnea, PND, claudication or edema. Respiratory: denies cough, dyspnea, DOE, pleurisy, hoarseness, laryngitis, wheezing.  Gastrointestinal: Denies dysphagia, odynophagia, heartburn, reflux, water brash, abdominal pain or cramps, nausea, vomiting, bloating, diarrhea, constipation, hematemesis, melena, hematochezia  or hemorrhoids. Genitourinary: Denies dysuria, frequency, urgency, nocturia, hesitancy, discharge, hematuria or flank pain. Musculoskeletal: Denies arthralgias, myalgias, stiffness, jt. swelling, pain, limping or strain/sprain.  Skin: Denies pruritus, rash, hives, warts, acne, eczema or change in skin lesion(s). Neuro: No weakness, tremor, incoordination, spasms, paresthesia or pain. Psychiatric: Denies confusion, memory loss or sensory loss. Endo: Denies change in weight, skin or hair change.  Heme/Lymph: No excessive bleeding, bruising or enlarged lymph  nodes.  Physical Exam  BP 110/82   Pulse (!) 44   Temp 97.7 F (36.5 C)   Resp 16   Ht 5' 8.5" (1.74 m)   Wt 179 lb 3.2 oz (81.3 kg)   SpO2 99%   BMI 26.85 kg/m   Appears  well nourished, well groomed  and in no distress.  Eyes: PERRLA, EOMs, conjunctiva no swelling or erythema. Sinuses: No frontal/maxillary tenderness ENT/Mouth: EAC's clear, TM's nl w/o erythema, bulging. Nares clear w/o erythema, swelling, exudates. Oropharynx clear without erythema or exudates. Oral hygiene is good. Tongue normal, non obstructing. Hearing intact.  Neck: Supple. Thyroid not palpable. Car 2+/2+ without bruits, nodes or JVD. Chest: Respirations nl with BS clear & equal w/o  rales, rhonchi, wheezing or stridor.  Cor: Heart sounds normal w/ regular rate and rhythm without sig. murmurs, gallops, clicks or rubs. Peripheral pulses normal and equal  without edema.  Abdomen: Soft & bowel sounds normal. Non-tender w/o guarding, rebound, hernias, masses or organomegaly.  Lymphatics: Unremarkable.  Musculoskeletal: Full ROM all peripheral extremities, joint stability, 5/5 strength and normal gait.  Skin: Warm, dry without exposed rashes, lesions or ecchymosis apparent.  Neuro: Cranial nerves intact, reflexes equal bilaterally. Sensory-motor testing grossly intact. Tendon reflexes grossly intact.  Pysch: Alert & oriented x 3.  Insight and judgement nl & appropriate. No ideations.  Assessment and Plan:  1. Essential hypertension  - Continue medication, monitor blood pressure at home.  - Continue DASH diet.  Reminder to go to the ER if any CP,  SOB, nausea, dizziness, severe HA, changes vision/speech.  - CBC with Differential/Platelet - COMPLETE METABOLIC PANEL WITH GFR - Magnesium - TSH  2. Hyperlipidemia, mixed  - Continue diet/meds, exercise,& lifestyle modifications.  - Continue monitor periodic cholesterol/liver & renal functions   - Lipid panel - TSH  3. Abnormal glucose  - Hemoglobin  A1c - Insulin, random  4. Vitamin D deficiency  - Continue diet, exercise  - Lifestyle modifications.  - Monitor appropriate labs. - Continue supplementation.  - VITAMIN D 25 Hydroxy   5. Idiopathic gout  - Uric acid  6. Hypothyroidism  - TSH  7. Testosterone Deficiency  - Testosterone  8. Medication management  - CBC with Differential/Platelet - COMPLETE METABOLIC PANEL WITH GFR - Magnesium - Lipid panel - TSH - Hemoglobin A1c - Insulin, random - VITAMIN D 25 Hydroxy  - Testosterone - Uric acid        Discussed  regular exercise, BP monitoring, weight control to achieve/maintain BMI less than 25 and discussed med and SE's. Recommended labs to assess and monitor clinical status with further disposition pending results of labs.  I discussed the assessment and treatment plan with the patient. The patient was provided an opportunity to ask questions and all were answered. The patient agreed with the plan and demonstrated an understanding of the instructions.  I provided over 30 minutes of exam, counseling, chart review and  complex critical decision making.         The patient was advised to call back or seek an in-person evaluation if the symptoms worsen or if the condition fails to improve as anticipated.   Marinus Maw, MD

## 2020-07-11 NOTE — Patient Instructions (Signed)

## 2020-07-12 ENCOUNTER — Other Ambulatory Visit: Payer: Self-pay

## 2020-07-12 ENCOUNTER — Ambulatory Visit (INDEPENDENT_AMBULATORY_CARE_PROVIDER_SITE_OTHER): Payer: BC Managed Care – PPO | Admitting: Internal Medicine

## 2020-07-12 VITALS — BP 110/82 | HR 44 | Temp 97.7°F | Resp 16 | Ht 68.5 in | Wt 179.2 lb

## 2020-07-12 DIAGNOSIS — E291 Testicular hypofunction: Secondary | ICD-10-CM | POA: Diagnosis not present

## 2020-07-12 DIAGNOSIS — E782 Mixed hyperlipidemia: Secondary | ICD-10-CM

## 2020-07-12 DIAGNOSIS — E559 Vitamin D deficiency, unspecified: Secondary | ICD-10-CM | POA: Diagnosis not present

## 2020-07-12 DIAGNOSIS — E039 Hypothyroidism, unspecified: Secondary | ICD-10-CM

## 2020-07-12 DIAGNOSIS — M549 Dorsalgia, unspecified: Secondary | ICD-10-CM

## 2020-07-12 DIAGNOSIS — Z79899 Other long term (current) drug therapy: Secondary | ICD-10-CM | POA: Diagnosis not present

## 2020-07-12 DIAGNOSIS — I10 Essential (primary) hypertension: Secondary | ICD-10-CM | POA: Diagnosis not present

## 2020-07-12 DIAGNOSIS — M1000 Idiopathic gout, unspecified site: Secondary | ICD-10-CM | POA: Diagnosis not present

## 2020-07-12 DIAGNOSIS — R7309 Other abnormal glucose: Secondary | ICD-10-CM | POA: Diagnosis not present

## 2020-07-12 MED ORDER — CYCLOBENZAPRINE HCL 10 MG PO TABS: ORAL_TABLET | ORAL | 3 refills | Status: DC

## 2020-07-13 LAB — LIPID PANEL
Cholesterol: 126 mg/dL (ref <200)
HDL: 26 mg/dL — ABNORMAL LOW (ref >=40)
LDL Cholesterol (Calc): 72 mg/dL (calc)
Non-HDL Cholesterol (Calc): 100 mg/dL (calc) (ref <130)
Total CHOL/HDL Ratio: 4.8 (calc) (ref <5.0)
Triglycerides: 184 mg/dL — ABNORMAL HIGH (ref <150)

## 2020-07-13 LAB — CBC WITH DIFFERENTIAL/PLATELET
Absolute Monocytes: 530 cells/uL (ref 200–950)
Basophils Absolute: 52 cells/uL (ref 0–200)
Basophils Relative: 0.5 %
Eosinophils Absolute: 135 cells/uL (ref 15–500)
Eosinophils Relative: 1.3 %
HCT: 44.5 % (ref 38.5–50.0)
Hemoglobin: 13.8 g/dL (ref 13.2–17.1)
Lymphs Abs: 3047 cells/uL (ref 850–3900)
MCH: 20.4 pg — ABNORMAL LOW (ref 27.0–33.0)
MCHC: 31 g/dL — ABNORMAL LOW (ref 32.0–36.0)
MCV: 65.9 fL — ABNORMAL LOW (ref 80.0–100.0)
Monocytes Relative: 5.1 %
Neutro Abs: 6635 cells/uL (ref 1500–7800)
Neutrophils Relative %: 63.8 %
Platelets: 179 10*3/uL (ref 140–400)
RBC: 6.75 10*6/uL — ABNORMAL HIGH (ref 4.20–5.80)
RDW: 19 % — ABNORMAL HIGH (ref 11.0–15.0)
Total Lymphocyte: 29.3 %
WBC: 10.4 10*3/uL (ref 3.8–10.8)

## 2020-07-13 LAB — VITAMIN D 25 HYDROXY (VIT D DEFICIENCY, FRACTURES): Vit D, 25-Hydroxy: 82 ng/mL (ref 30–100)

## 2020-07-13 LAB — TSH: TSH: 3.75 mIU/L (ref 0.40–4.50)

## 2020-07-13 LAB — COMPLETE METABOLIC PANEL WITH GFR
AG Ratio: 2.8 (calc) — ABNORMAL HIGH (ref 1.0–2.5)
ALT: 34 U/L (ref 9–46)
AST: 40 U/L — ABNORMAL HIGH (ref 10–35)
Albumin: 4.5 g/dL (ref 3.6–5.1)
Alkaline phosphatase (APISO): 59 U/L (ref 35–144)
BUN: 13 mg/dL (ref 7–25)
CO2: 30 mmol/L (ref 20–32)
Calcium: 9.4 mg/dL (ref 8.6–10.3)
Chloride: 100 mmol/L (ref 98–110)
Creat: 1.24 mg/dL (ref 0.70–1.25)
GFR, Est African American: 71 mL/min/{1.73_m2} (ref >=60)
GFR, Est Non African American: 61 mL/min/{1.73_m2} (ref >=60)
Globulin: 1.6 g/dL (calc) — ABNORMAL LOW (ref 1.9–3.7)
Glucose, Bld: 77 mg/dL (ref 65–99)
Potassium: 4 mmol/L (ref 3.5–5.3)
Sodium: 137 mmol/L (ref 135–146)
Total Bilirubin: 1.5 mg/dL — ABNORMAL HIGH (ref 0.2–1.2)
Total Protein: 6.1 g/dL (ref 6.1–8.1)

## 2020-07-13 LAB — INSULIN, RANDOM: Insulin: 3.9 u[IU]/mL

## 2020-07-13 LAB — HEMOGLOBIN A1C
Hgb A1c MFr Bld: 5.1 % of total Hgb (ref <5.7)
Mean Plasma Glucose: 100 mg/dL
eAG (mmol/L): 5.5 mmol/L

## 2020-07-13 LAB — MAGNESIUM: Magnesium: 1.9 mg/dL (ref 1.5–2.5)

## 2020-07-13 LAB — URIC ACID: Uric Acid, Serum: 6.6 mg/dL (ref 4.0–8.0)

## 2020-07-13 LAB — TESTOSTERONE: Testosterone: 2107 ng/dL — ABNORMAL HIGH (ref 250–827)

## 2020-07-13 NOTE — Progress Notes (Signed)
============================================================ -   Test results slightly outside the reference range are not unusual. If there is anything important, I will review this with you,  otherwise it is considered normal test values.  If you have further questions,  please do not hesitate to contact me at the office or via My Chart.  ============================================================ ============================================================  -  CBC - Stable - No Anemia & WBC & Platelet counts are both Normal & OK  ============================================================ ============================================================  - Total Chol = 126   and   LDL Chol  -> Both -  Excellent   - Very low risk for Heart Attack  / Stroke ============================================================ ============================================================  - A1c - Normal - Great - No Diabetes  ! ============================================================ ============================================================  - Vitamin D = 82 - Excellent  ============================================================ ============================================================  - Testosterone very elevated from recent shot   - Recommend change from 2 ml every 2 weeks to                               --- >>>      1 ml every week  ============================================================ ============================================================  - Uric Acid / Gout test is Normal & OK  ============================================================ ============================================================

## 2020-08-26 ENCOUNTER — Ambulatory Visit: Payer: BC Managed Care – PPO | Admitting: Internal Medicine

## 2020-08-26 ENCOUNTER — Encounter: Payer: Self-pay | Admitting: Internal Medicine

## 2020-08-26 ENCOUNTER — Ambulatory Visit (INDEPENDENT_AMBULATORY_CARE_PROVIDER_SITE_OTHER): Payer: BC Managed Care – PPO | Admitting: Internal Medicine

## 2020-08-26 ENCOUNTER — Other Ambulatory Visit: Payer: Self-pay

## 2020-08-26 VITALS — BP 113/78 | HR 54 | Temp 97.7°F | Resp 16 | Ht 68.0 in | Wt 176.8 lb

## 2020-08-26 DIAGNOSIS — M549 Dorsalgia, unspecified: Secondary | ICD-10-CM | POA: Diagnosis not present

## 2020-08-26 MED ORDER — DEXAMETHASONE 4 MG PO TABS: ORAL_TABLET | ORAL | 0 refills | Status: DC

## 2020-08-26 MED ORDER — TADALAFIL 20 MG PO TABS: ORAL_TABLET | ORAL | 1 refills | Status: DC

## 2020-08-26 NOTE — Progress Notes (Signed)
Future Appointments  Date Time Provider Department Center  08/26/2020 11:30 AM [name], MD [facility] None  12/30/2020  - CPE  3:00 PM [name], MD [facility] None    History of Present Illness:        This very nice 65 y.o.m [name] with HTN, HLD, Pre-Diabetes, Hypothyroidism, Testosterone Deficiency, Gout and Vitamin D Deficiency presents with c/o bilateral low back pain  w/o hx/o injury non c/o sciatica.   Medications     levothyroxine 50 MCG tablet, Take  1 tablet  Daily    testosterone cypio 200 MG/ML injection, INJECT 2 ML INTO MUSCLE EVERY 2 WEEKS     atenolol  100 MG tablet, TAKE 1 TABLET DAILY    enalapril 20 MG tablet, TAKE 1 TABLET DAILY    tadalafil 20 MG tablet, Take 1 tablet  daily as needed for erectile dysfunctio   allopurinol ( 300 MG tablet, Takes 1/2 tablet daily)    meloxicam (MOBIC) 15 MG tablet, TAKE 1/2 TO 1 TABLET DAILY WITH FOOD AS NEEDED FOR PAIN & INFLAMMATION   cyclobenzaprine (FLEXERIL) 10 MG tablet, TAKE 1/2 TO 1 TABLET 2 TO 3 TIMES DAILY FOR MUSCLE SPASM    Cholecalciferol (VITAMIN D) 2000 UNITS tablet, Take 2,000 Units by mouth 4 (four) times daily.    clonazePAM  1 MG tablet, Take 1 mg by mouth 2 (two) times daily as needed. Takes up to 2 tabs daily prn    diclofenac 1 % GEL, APPLY 2 TO 4 GRAMS TOPICALLY TO PAINFUL JOINTS 2 TO 4 X /DAY   gabapentin (NEURONTIN) 800 MG tablet, Take 6 tablets Daily as Directed for Chronic Pain   methylphenidate (METADATE CD) 20 MG CR capsule,    mirtazapine (REMERON) 45 MG tablet, Take 45 mg by mouth at bedtime.   Omega-3 Fatty Acids (FISH OIL) 1000 MG CAPS, Take 1,000 mg by mouth.    risperiDONE (RISPERDAL) 2 MG tablet, Take 2 mg by mouth at bedtime.   sertraline (ZOLOFT) 100 MG tablet, Take 100 mg by mouth daily.   Problem list He has Hypertension; Hereditary spherocytosis (HCC); ADD (attention deficit disorder); Other abnormal glucose; Vitamin D deficiency; Hyperlipidemia; Testosterone  Deficiency; Medication management; Gout; Bipolar disorder with depression (HCC); BMI 25.0-25.9,adult; Lumbar back pain; and Hypothyroidism on their problem list.   Observations/Objective:  BP 113/78   Pulse (!) 54   Temp 97.7 F (36.5 C)   Resp 16   Ht 5\' 8"  (1.727 m)   Wt 176 lb 12.8 oz (80.2 kg)   SpO2 97%   BMI 26.88 kg/m   HEENT - WNL. Neck - supple.  Chest - Clear equal BS. Cor - Nl HS. RRR w/o sig MGR. PP 1(+). No edema. MS- FROM w/o deformities.  Tender bilateral mid lumbar Paraspinous spasm.  Gait Nl. Neuro -  Nl w/o focal abnormalities.   Assessment and Plan:   1. Mid back pain  - dexamethasone  4 MG; Take 1 tab 3 x day - 3 days, then 2 x day - 3 days, then 1 tab daily  Disp: 20 tab - DG Thoracic Spine W/Swimmers - DG Lumbar Spine Complete; Future  Follow Up Instructions:       I discussed the assessment and treatment plan with the patient. The patient was provided an opportunity to ask questions and all were answered. The patient agreed with the plan and demonstrated an understanding of the instructions.       The patient was advised to call  back or seek an in-person evaluation if the symptoms worsen or if the condition fails to improve as anticipated.   Kirtland Bouchard, MD

## 2020-08-27 ENCOUNTER — Ambulatory Visit
Admission: RE | Admit: 2020-08-27 | Discharge: 2020-08-27 | Disposition: A | Discharge status: Home / Self Care | Payer: BC Managed Care – PPO | Source: Ambulatory Visit | Attending: Internal Medicine | Admitting: Internal Medicine

## 2020-08-27 ENCOUNTER — Other Ambulatory Visit: Payer: Self-pay | Admitting: Internal Medicine

## 2020-08-27 DIAGNOSIS — M549 Dorsalgia, unspecified: Secondary | ICD-10-CM

## 2020-08-28 ENCOUNTER — Other Ambulatory Visit: Payer: Self-pay | Admitting: Internal Medicine

## 2020-08-28 DIAGNOSIS — I700 Atherosclerosis of aorta: Secondary | ICD-10-CM

## 2020-08-28 HISTORY — DX: Atherosclerosis of aorta: I70.0

## 2020-08-28 MED ORDER — TADALAFIL 20 MG PO TABS: ORAL_TABLET | ORAL | 1 refills | Status: DC

## 2020-08-28 NOTE — Progress Notes (Signed)
============================================================ ============================================================  -   Thoracic spine X-rays show scoliosis (curvature) of the spine &   Degenerative Arthritic changes - There is a ? of mild wedging (or partial   collapse or compression-type) changes - suspected compression type  fracture or collapse of the T12 or L1 vertebrae in the area that you are   having your tenderness and pain.  ============================================================ ============================================================  - Lumbar spine again shows scoliosis and also changes at                                      theT12 - L1 vertebral Junction  - The good news is that there is no sign of cancer or tumor in thr bone   ============================================================  - Let's see how you do with the steroid taper , and if you like I can order                                             some physical therapy   ============================================================ ============================================================ Written by [name], MD on 08/28/2020 3:44 PM EDT

## 2020-08-28 NOTE — Progress Notes (Signed)
============================================================ ============================================================  -    Thoracic spine X-rays  show scoliosis (curvature) of the spine  &   Degenerative Arthritic changes - There is a ? of mild wedging  (or partial   collapse or compression-type)  changes - suspected compression type   fracture or collapse of the T12 or L1 vertebrae in the area that you are   having your tenderness and pain.  ============================================================ ============================================================  -  Lumbar spine again shows scoliosis and also changes at                                                                            the T12 - L1 vertebral Junction  -  The good news is that there is no sign of cancer or tumor in thr bone   ============================================================  - Let's see how you do with the steroid taper , and if you like I can order                                                                                            some physical therapy   ============================================================ ============================================================

## 2020-09-09 ENCOUNTER — Other Ambulatory Visit: Payer: Self-pay | Admitting: Internal Medicine

## 2020-09-09 DIAGNOSIS — M1000 Idiopathic gout, unspecified site: Secondary | ICD-10-CM

## 2020-09-09 MED ORDER — ALLOPURINOL 300 MG PO TABS: ORAL_TABLET | ORAL | 3 refills | Status: DC

## 2020-10-03 ENCOUNTER — Other Ambulatory Visit: Payer: Self-pay | Admitting: Adult Health

## 2020-10-30 ENCOUNTER — Other Ambulatory Visit: Payer: Self-pay | Admitting: Internal Medicine

## 2020-11-02 ENCOUNTER — Other Ambulatory Visit: Payer: Self-pay | Admitting: Internal Medicine

## 2020-11-02 DIAGNOSIS — M549 Dorsalgia, unspecified: Secondary | ICD-10-CM

## 2020-11-10 ENCOUNTER — Other Ambulatory Visit: Payer: Self-pay | Admitting: Internal Medicine

## 2020-11-10 DIAGNOSIS — E349 Endocrine disorder, unspecified: Secondary | ICD-10-CM

## 2020-12-03 ENCOUNTER — Other Ambulatory Visit: Payer: Self-pay | Admitting: Internal Medicine

## 2020-12-30 ENCOUNTER — Ambulatory Visit (INDEPENDENT_AMBULATORY_CARE_PROVIDER_SITE_OTHER): Payer: BC Managed Care – PPO | Admitting: Internal Medicine

## 2020-12-30 DIAGNOSIS — E559 Vitamin D deficiency, unspecified: Secondary | ICD-10-CM

## 2020-12-30 DIAGNOSIS — E291 Testicular hypofunction: Secondary | ICD-10-CM

## 2020-12-30 DIAGNOSIS — R7309 Other abnormal glucose: Secondary | ICD-10-CM

## 2020-12-30 DIAGNOSIS — D580 Hereditary spherocytosis: Secondary | ICD-10-CM

## 2020-12-30 DIAGNOSIS — I10 Essential (primary) hypertension: Secondary | ICD-10-CM

## 2020-12-30 DIAGNOSIS — Z91199 Patient's noncompliance with other medical treatment and regimen due to unspecified reason: Secondary | ICD-10-CM

## 2020-12-30 DIAGNOSIS — E039 Hypothyroidism, unspecified: Secondary | ICD-10-CM

## 2020-12-30 DIAGNOSIS — I700 Atherosclerosis of aorta: Secondary | ICD-10-CM

## 2020-12-30 DIAGNOSIS — M1000 Idiopathic gout, unspecified site: Secondary | ICD-10-CM

## 2020-12-30 DIAGNOSIS — E782 Mixed hyperlipidemia: Secondary | ICD-10-CM

## 2020-12-30 NOTE — Progress Notes (Signed)
       N  O       S  H  O  W                                                                                                                                                                                                                                                                                 This very nice 65 y.o. [name] presents for a Screening /Preventative Visit & comprehensive evaluation and management of multiple medical co-morbidities.  Patient has been followed for HTN, HLD, Prediabetes and Vitamin D Deficiency.       HTN predates since     . Patient's BP has been controlled at home.  Today's  . Patient denies any cardiac symptoms as chest pain, palpitations, shortness of breath, dizziness or ankle swelling.       Patient's hyperlipidemia is controlled with diet and medications. Patient denies myalgias or other medication SE's. Last lipids were at goal except elevated Trig's :  Lab Results  Component Value Date   CHOL 126 07/12/2020   HDL 26 (L) 07/12/2020   LDLCALC 72 07/12/2020   TRIG 184 (H) 07/12/2020   CHOLHDL 4.8 07/12/2020         Patient has hx/o prediabetes since    and patient denies reactive hypoglycemic symptoms, visual blurring, diabetic polys or paresthesias. Last A1c was normal & at goal :   Lab Results  Component Value Date   HGBA1C 5.1 07/12/2020          Finally, patient has history of Vitamin D Deficiency of    and last vitamin D was at goal :   Lab Results  Component Value Date   VD25OH 82 07/12/2020

## 2021-02-21 ENCOUNTER — Encounter: Payer: Self-pay | Admitting: Internal Medicine

## 2021-02-21 NOTE — Progress Notes (Signed)
Annual  Screening/Preventative Visit  & Comprehensive Evaluation & Examination  Future Appointments  Date Time Provider Department  02/22/2021 10:00 AM [name], MD [facility]            This very nice 65 y.o. [name]  presents for a Screening /Preventative Visit & comprehensive evaluation and management of multiple medical co-morbidities.  Patient has been followed for HTN, HLD, Prediabetes and Vitamin D Deficiency. Patient's Gout is controlled on his meds.  Patient haas hx/o Hereditary Spherocytosis w/o anemia. 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        HTN predates since 1998. Patient's BP has been controlled at home.  Today's BP is at goal -   128/72. Patient denies any cardiac symptoms as chest pain, palpitations, shortness of breath, dizziness or ankle swelling.       Patient's hyperlipidemia is controlled with diet .  Last lipids were at goal with sl elevated Trig's : Lab Results  Component Value Date   CHOL 126 07/12/2020   HDL 26 (L) 07/12/2020   LDLCALC 72 07/12/2020   TRIG 184 (H) 07/12/2020   CHOLHDL 4.8 07/12/2020        Patient has hx/o prediabetes (A1c 5.8% /2010) and patient denies reactive hypoglycemic symptoms, visual blurring, diabetic polys or paresthesias. Last A1c was normal & at goal :   Lab Results  Component Value Date   HGBA1C 5.1 07/12/2020                         In Dec 2020, patient was  dx'd Hypothyroid and started on Thyroid Replacement in Feb 2021.                                           Patient has been on parenteral  Testosterone replacement for hx/o Testosterone Deficiency  with improved Stamina  & sense of well being.                                               Finally, patient has history of Vitamin D Deficiency ("31" /2008 ) and last vitamin D was at goal :   Lab Results  Component Value Date   VD25OH 82 07/12/2020     Current Outpatient Medications on File Prior to Visit   Medication Sig   allopurinol 300 MG tablet Take  1/2 tablet  Daily     atenolol 100 MG tablet TAKE 1 TABLET DAILY    VITAMIN D 2000 u Take 4  times daily. (8,000 u)    clonazePAM 1 MG tablet Take 1 mg 2  times daily as needed. Takes up to 2 tabs daily prn   cyclobenzaprine  10 MG tablet Take 1/2 - 1 tablet 2-3 x /day as needed    gabapentin 800 MG tablet TAKE 6 TABLETS DAILY AS DIRECTED    levothyroxine 50 MCG tablet Take  1 tablet  Daily   lisinopril 20 MG tablet Take  1 tablet  Daily for BP   meloxicam (15 MG tablet TAKE 1/2 TO 1 TABLET DAILY    methylphenidate  CD 20 MG CR     mirtazapine  45 MG tablet Take 45 mg  at bedtime.   Omega-3 FISH OIL 1000 MG  Take daily   risperiDONE 2 MG tablet Take at bedtime.   sertraline  100 MG tablet Take  daily.   tadalafil \\20  MG tablet Take  1/2 to 1 tablet  every 2 to 3 days  as needed for  XXXX   testosterone cypio 200 mg/ml  injec INJECT 2 ML IM EVERY 2 WEEKS      Allergies  Allergen Reactions   Serzone [Nefazodone] Leg cramps   Trazamine [Trazodone]  confusion       Past Medical History:  Diagnosis Date   ADD (attention deficit disorder)    Anemia    Depression    Elevated hemoglobin A1c    Hyperlipidemia    Hypertension    Vitamin D deficiency      Health Maintenance  Topic Date Due   Pneumococcal Vaccine  03/17/1962   Zoster Vaccines- Shingrix (1 of 2) Never done   COVID-19 Vaccine (2 - Janssen risk series) 06/13/2019   COLONOSCOPY  08/18/2019   INFLUENZA VACCINE  10/04/2020   TETANUS/TDAP  03/24/2022   Hepatitis C Screening  Completed   HIV Screening  Completed   HPV VACCINES  Aged Out     Immunization History  Administered Date(s) Administered   Influenza Inj Mdck Quad  12/06/2017   Influenza 12/08/2016   Influenza,inj,Quad  12/08/2013   Influenza 12/07/2016, 10/22/2018, 12/05/2019   Janssen (J&J) SARS-COV-2 Vacc 05/16/2019   PPD Test 08/30/2016, 10/23/2017, 11/26/2018, 12/25/2019   Pneumococcal-23  03/06/2009   Td 03/24/2012    Last Colon -   08/23/2009 - Dr Earle Gell -Recc 10 yr f/u due June 2021   Past Surgical History:  Procedure Laterality Date   FRACTURE SURGERY     ORTHOPEDIC SURGERY Left    left foot pinning   ORTHOPEDIC SURGERY Left    left foot pinning   ORTHOPEDIC SURGERY Right    right elbow bursectomy  1984   ORTHOPEDIC SURGERY Right 2002 dr supple   right rotator cuff     Family History  Problem Relation Age of Onset   Cancer Mother        melonoma   Hypertension Father    Aortic stenosis Father      Social History   Tobacco Use   Smoking status: Never   Smokeless tobacco: Never  Substance Use Topics   Alcohol use: No    Alcohol/week: 0.0 standard drinks      ROS Constitutional: Denies fever, chills, weight loss/gain, headaches, insomnia,  night sweats or change in appetite. Does c/o fatigue. Eyes: Denies redness, blurred vision, diplopia, discharge, itchy or watery eyes.  ENT: Denies discharge, congestion, post nasal drip, epistaxis, sore throat, earache, hearing loss, dental pain, Tinnitus, Vertigo, Sinus pain or snoring.  Cardio: Denies chest pain, palpitations, irregular heartbeat, syncope, dyspnea, diaphoresis, orthopnea, PND, claudication or edema Respiratory: denies cough, dyspnea, DOE, pleurisy, hoarseness, laryngitis or wheezing.  Gastrointestinal: Denies dysphagia, heartburn, reflux, water brash, pain, cramps, nausea, vomiting, bloating, diarrhea, constipation, hematemesis, melena, hematochezia, jaundice or hemorrhoids Genitourinary: Denies dysuria, frequency, urgency, nocturia, hesitancy, discharge, hematuria or flank pain Musculoskeletal: Denies arthralgia, myalgia, stiffness, Jt. Swelling, pain, limp or strain/sprain. Denies Falls. Skin: Denies puritis, rash, hives, warts, acne, eczema or change in skin lesion Neuro: No weakness, tremor, incoordination, spasms, paresthesia or pain Psychiatric: Denies confusion, memory loss or  sensory loss. Denies Depression. Endocrine: Denies change in weight, skin, hair change, nocturia, and paresthesia, diabetic polys, visual blurring or hyper /  hypo glycemic episodes.  Heme/Lymph: No excessive bleeding, bruising or enlarged lymph nodes.   Physical Exam  BP 128/72    Pulse (!) 58    Temp 97.9 F (36.6 C)    Resp 16    Ht 5\' 8"  (1.727 m)    Wt 171 lb 3.2 oz (77.7 kg)    SpO2 97%    BMI 26.03 kg/m   General Appearance: Well nourished and well groomed and in no apparent distress.  Eyes: PERRLA, EOMs, conjunctiva no swelling or erythema, normal fundi and vessels. Sinuses: No frontal/maxillary tenderness ENT/Mouth: EACs patent / TMs  nl. Nares clear without erythema, swelling, mucoid exudates. Oral hygiene is good. No erythema, swelling, or exudate. Tongue normal, non-obstructing. Tonsils not swollen or erythematous. Hearing normal.  Neck: Supple, thyroid not palpable. No bruits, nodes or JVD. Respiratory: Respiratory effort normal.  BS equal and clear bilateral without rales, rhonci, wheezing or stridor. Cardio: Heart sounds are normal with regular rate and rhythm and no murmurs, rubs or gallops. Peripheral pulses are normal and equal bilaterally without edema. No aortic or femoral bruits. Chest: symmetric with normal excursions and percussion.  Abdomen: Soft, with Nl bowel sounds. Nontender, no guarding, rebound, hernias, masses, or organomegaly.  Lymphatics: Non tender without lymphadenopathy.  Musculoskeletal: Full ROM all peripheral extremities, joint stability, 5/5 strength, and normal gait. Skin: Warm and dry without rashes, lesions, cyanosis, clubbing or  ecchymosis.  Neuro: Cranial nerves intact, reflexes equal bilaterally. Normal muscle tone, no cerebellar symptoms. Sensation intact.  Pysch: Alert and oriented X 3 with normal affect, insight and judgment appropriate.   Assessment and Plan  1. Annual Preventative/Screening Exam    2. Essential hypertension  - EKG  12-Lead - [place], RETROPERITNL ABD,  LTD - Urinalysis, Routine w reflex microscopic - Microalbumin / creatinine urine ratio - CBC with Differential/Platelet - COMPLETE METABOLIC PANEL WITH GFR - Magnesium - TSH  3. Hyperlipidemia, mixed  - EKG 12-Lead - [place], RETROPERITNL ABD,  LTD - Lipid panel - TSH  4. Abnormal glucose  - EKG 12-Lead - [place], RETROPERITNL ABD,  LTD - Hemoglobin A1c - Insulin, random  5. Vitamin D deficiency  - VITAMIN D 25 Hydroxy   6. Aortic atherosclerosis ([place]) by T-Spine Xray on 08/26/2020  - EKG 12-Lead - [place], RETROPERITNL ABD,  LTD - Lipid panel  7. Hypothyroidism, unspecified type  - TSH  8. Testosterone Deficiency  - Testosterone  9. Idiopathic gout  - Uric acid  10. Hereditary spherocytosis (HCC)  - CBC with Differential/Platelet  11. 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 Prostate cancer screening  - PSA  16. Screening for colorectal cancer  - POC Hemoccult Bld/Stl   17. Screening for ischemic heart disease  - EKG 12-Lead  18. FHx: heart disease  - EKG 12-Lead - [place], RETROPERITNL ABD,  LTD  19. Screening for AAA (aortic abdominal aneurysm)  - [place], RETROPERITNL ABD,  LTD  20. Fatigue, unspecified type  - Iron, Total/Total Iron Binding Cap - Vitamin B12 - Testosterone - CBC with Differential/Platelet - TSH  21. Medication management  - Urinalysis, Routine w reflex microscopic - Microalbumin / creatinine urine ratio - Testosterone - Uric acid - CBC with Differential/Platelet - COMPLETE METABOLIC PANEL WITH GFR - Magnesium - Lipid panel - TSH - Hemoglobin  A1c - Insulin, random - VITAMIN D 25 Hydroxy           Patient was counseled in prudent diet, weight control to  achieve/maintain BMI less than 25, BP monitoring, regular exercise and medications as discussed.  Discussed med effects and SE's. Routine screening labs and tests as requested with regular follow-up as recommended. Over 40 minutes of exam, counseling, chart review and high complex critical decision making was performed   Kirtland Bouchard, MD

## 2021-02-21 NOTE — Patient Instructions (Signed)

## 2021-02-22 ENCOUNTER — Ambulatory Visit (INDEPENDENT_AMBULATORY_CARE_PROVIDER_SITE_OTHER): Payer: BC Managed Care – PPO | Admitting: Internal Medicine

## 2021-02-22 ENCOUNTER — Encounter: Payer: Self-pay | Admitting: Internal Medicine

## 2021-02-22 ENCOUNTER — Other Ambulatory Visit: Payer: Self-pay

## 2021-02-22 VITALS — BP 128/72 | HR 58 | Temp 97.9°F | Resp 16 | Ht 68.0 in | Wt 171.2 lb

## 2021-02-22 DIAGNOSIS — Z13220 Encounter for screening for lipoid disorders: Secondary | ICD-10-CM | POA: Diagnosis not present

## 2021-02-22 DIAGNOSIS — N138 Other obstructive and reflux uropathy: Secondary | ICD-10-CM

## 2021-02-22 DIAGNOSIS — Z0000 Encounter for general adult medical examination without abnormal findings: Secondary | ICD-10-CM

## 2021-02-22 DIAGNOSIS — E559 Vitamin D deficiency, unspecified: Secondary | ICD-10-CM | POA: Diagnosis not present

## 2021-02-22 DIAGNOSIS — R350 Frequency of micturition: Secondary | ICD-10-CM

## 2021-02-22 DIAGNOSIS — I700 Atherosclerosis of aorta: Secondary | ICD-10-CM

## 2021-02-22 DIAGNOSIS — M1000 Idiopathic gout, unspecified site: Secondary | ICD-10-CM

## 2021-02-22 DIAGNOSIS — Z136 Encounter for screening for cardiovascular disorders: Secondary | ICD-10-CM

## 2021-02-22 DIAGNOSIS — Z125 Encounter for screening for malignant neoplasm of prostate: Secondary | ICD-10-CM | POA: Diagnosis not present

## 2021-02-22 DIAGNOSIS — Z1389 Encounter for screening for other disorder: Secondary | ICD-10-CM | POA: Diagnosis not present

## 2021-02-22 DIAGNOSIS — Z79899 Other long term (current) drug therapy: Secondary | ICD-10-CM

## 2021-02-22 DIAGNOSIS — F319 Bipolar disorder, unspecified: Secondary | ICD-10-CM

## 2021-02-22 DIAGNOSIS — Z1329 Encounter for screening for other suspected endocrine disorder: Secondary | ICD-10-CM | POA: Diagnosis not present

## 2021-02-22 DIAGNOSIS — Z1211 Encounter for screening for malignant neoplasm of colon: Secondary | ICD-10-CM

## 2021-02-22 DIAGNOSIS — Z130 Encounter for screening for diseases of the blood and blood-forming organs and certain disorders involving the immune mechanism: Secondary | ICD-10-CM | POA: Diagnosis not present

## 2021-02-22 DIAGNOSIS — F900 Attention-deficit hyperactivity disorder, predominantly inattentive type: Secondary | ICD-10-CM

## 2021-02-22 DIAGNOSIS — Z131 Encounter for screening for diabetes mellitus: Secondary | ICD-10-CM | POA: Diagnosis not present

## 2021-02-22 DIAGNOSIS — N401 Enlarged prostate with lower urinary tract symptoms: Secondary | ICD-10-CM

## 2021-02-22 DIAGNOSIS — D580 Hereditary spherocytosis: Secondary | ICD-10-CM

## 2021-02-22 DIAGNOSIS — E039 Hypothyroidism, unspecified: Secondary | ICD-10-CM

## 2021-02-22 DIAGNOSIS — E782 Mixed hyperlipidemia: Secondary | ICD-10-CM

## 2021-02-22 DIAGNOSIS — Z8249 Family history of ischemic heart disease and other diseases of the circulatory system: Secondary | ICD-10-CM

## 2021-02-22 DIAGNOSIS — Z0001 Encounter for general adult medical examination with abnormal findings: Secondary | ICD-10-CM

## 2021-02-22 DIAGNOSIS — R7309 Other abnormal glucose: Secondary | ICD-10-CM

## 2021-02-22 DIAGNOSIS — I10 Essential (primary) hypertension: Secondary | ICD-10-CM

## 2021-02-22 DIAGNOSIS — E291 Testicular hypofunction: Secondary | ICD-10-CM

## 2021-02-22 DIAGNOSIS — R5383 Other fatigue: Secondary | ICD-10-CM

## 2021-02-22 DIAGNOSIS — Z111 Encounter for screening for respiratory tuberculosis: Secondary | ICD-10-CM | POA: Diagnosis not present

## 2021-02-23 LAB — COMPLETE METABOLIC PANEL WITH GFR
AG Ratio: 2.7 (calc) — ABNORMAL HIGH (ref 1.0–2.5)
ALT: 34 U/L (ref 9–46)
AST: 43 U/L — ABNORMAL HIGH (ref 10–35)
Albumin: 4.9 g/dL (ref 3.6–5.1)
Alkaline phosphatase (APISO): 67 U/L (ref 35–144)
BUN: 18 mg/dL (ref 7–25)
CO2: 32 mmol/L (ref 20–32)
Calcium: 10.5 mg/dL — ABNORMAL HIGH (ref 8.6–10.3)
Chloride: 99 mmol/L (ref 98–110)
Creat: 1.17 mg/dL (ref 0.70–1.35)
Globulin: 1.8 g/dL (calc) — ABNORMAL LOW (ref 1.9–3.7)
Glucose, Bld: 84 mg/dL (ref 65–99)
Potassium: 5 mmol/L (ref 3.5–5.3)
Sodium: 139 mmol/L (ref 135–146)
Total Bilirubin: 1.1 mg/dL (ref 0.2–1.2)
Total Protein: 6.7 g/dL (ref 6.1–8.1)
eGFR: 70 mL/min/{1.73_m2} (ref >=60)

## 2021-02-23 LAB — CBC WITH DIFFERENTIAL/PLATELET
Absolute Monocytes: 484 cells/uL (ref 200–950)
Basophils Absolute: 53 cells/uL (ref 0–200)
Basophils Relative: 0.6 %
Eosinophils Absolute: 97 cells/uL (ref 15–500)
Eosinophils Relative: 1.1 %
HCT: 47.1 % (ref 38.5–50.0)
Hemoglobin: 14.4 g/dL (ref 13.2–17.1)
Lymphs Abs: 2358 cells/uL (ref 850–3900)
MCH: 20.3 pg — ABNORMAL LOW (ref 27.0–33.0)
MCHC: 30.6 g/dL — ABNORMAL LOW (ref 32.0–36.0)
MCV: 66.5 fL — ABNORMAL LOW (ref 80.0–100.0)
Monocytes Relative: 5.5 %
Neutro Abs: 5808 cells/uL (ref 1500–7800)
Neutrophils Relative %: 66 %
Platelets: 223 10*3/uL (ref 140–400)
RBC: 7.08 10*6/uL — ABNORMAL HIGH (ref 4.20–5.80)
RDW: 19.2 % — ABNORMAL HIGH (ref 11.0–15.0)
Total Lymphocyte: 26.8 %
WBC: 8.8 10*3/uL (ref 3.8–10.8)

## 2021-02-23 LAB — LIPID PANEL
Cholesterol: 157 mg/dL (ref <200)
HDL: 27 mg/dL — ABNORMAL LOW (ref >=40)
Non-HDL Cholesterol (Calc): 130 mg/dL (calc) — ABNORMAL HIGH (ref <130)
Total CHOL/HDL Ratio: 5.8 (calc) — ABNORMAL HIGH (ref <5.0)
Triglycerides: 439 mg/dL — ABNORMAL HIGH (ref <150)

## 2021-02-23 LAB — TESTOSTERONE: Testosterone: 1324 ng/dL — ABNORMAL HIGH (ref 250–827)

## 2021-02-23 LAB — HEMOGLOBIN A1C
Hgb A1c MFr Bld: 5.2 % of total Hgb (ref <5.7)
Mean Plasma Glucose: 103 mg/dL
eAG (mmol/L): 5.7 mmol/L

## 2021-02-23 LAB — PSA: PSA: 1.02 ng/mL (ref <=4.00)

## 2021-02-23 LAB — URINALYSIS, ROUTINE W REFLEX MICROSCOPIC
Bilirubin Urine: NEGATIVE
Glucose, UA: NEGATIVE
Hgb urine dipstick: NEGATIVE
Ketones, ur: NEGATIVE
Leukocytes,Ua: NEGATIVE
Nitrite: NEGATIVE
Protein, ur: NEGATIVE
Specific Gravity, Urine: 1.014 (ref 1.001–1.035)
pH: <=5 (ref 5.0–8.0)

## 2021-02-23 LAB — MICROALBUMIN / CREATININE URINE RATIO
Creatinine, Urine: 106 mg/dL (ref 20–320)
Microalb Creat Ratio: 6 mcg/mg creat (ref <30)
Microalb, Ur: 0.6 mg/dL

## 2021-02-23 LAB — VITAMIN D 25 HYDROXY (VIT D DEFICIENCY, FRACTURES): Vit D, 25-Hydroxy: 62 ng/mL (ref 30–100)

## 2021-02-23 LAB — MAGNESIUM: Magnesium: 2.1 mg/dL (ref 1.5–2.5)

## 2021-02-23 LAB — IRON, TOTAL/TOTAL IRON BINDING CAP
%SAT: 46 % (calc) (ref 20–48)
Iron: 169 ug/dL (ref 50–180)
TIBC: 366 mcg/dL (calc) (ref 250–425)

## 2021-02-23 LAB — URIC ACID: Uric Acid, Serum: 6.6 mg/dL (ref 4.0–8.0)

## 2021-02-23 LAB — VITAMIN B12: Vitamin B-12: 569 pg/mL (ref 200–1100)

## 2021-02-23 LAB — INSULIN, RANDOM: Insulin: 8.8 u[IU]/mL

## 2021-02-23 LAB — TSH: TSH: 1.91 mIU/L (ref 0.40–4.50)

## 2021-02-23 NOTE — Progress Notes (Signed)
============================================================ °-   Test results slightly outside the reference range are not unusual. If there is anything important, I will review this with you,  otherwise it is considered normal test values.  If you have further questions,  please do not hesitate to contact me at the office or via My Chart.  ============================================================ ============================================================  -  Iron & Vitamin B12 levels are both Normal & OK  ============================================================ ============================================================  - PSA - very Low  - Great ! ============================================================ ============================================================  - Testosterone level very high due to recent shot ============================================================ ============================================================  - Uric Acid  / Gout test is Normal & OK  ============================================================ ============================================================  - CBC - OK - Shows Norma  Red Cell Count, WBC  & platelet counts  ============================================================ ============================================================  - Total Chol = 157   --  Excellent   - Very low risk for Heart Attack  / Stroke ============================================================ ============================================================  -  But Triglycerides (   439   ) or fats in blood are too high  (goal is less than 150)    - Recommend avoid fried & greasy foods,  sweets / candy,   - Avoid white rice  (brown or wild rice or Quinoa is OK),   - Avoid white potatoes  (sweet potatoes are OK)   - Avoid anything made from white flour  - bagels, doughnuts, rolls, buns, biscuits, white and   wheat breads, pizza crust  and traditional  pasta made of white flour & egg white  - (vegetarian pasta or spinach or wheat pasta is OK).    - Multi-grain bread is OK - like multi-grain flat bread or  sandwich thins.   - Avoid alcohol in excess.   - Exercise is also important. ============================================================ ============================================================  - A1c - Normal -   Great   - No Diabetes  ! ============================================================ ============================================================  -  Vitamin D = 62 - Great  ! ============================================================ ============================================================  - All Else - CBC - Kidneys - Electrolytes - Liver - Magnesium & Thyroid    - all  Normal / OK ============================================================ ============================================================  -  Keep up the [place] Work  !  - [employer]  !  ============================================================ ============================================================

## 2021-02-26 ENCOUNTER — Other Ambulatory Visit: Payer: Self-pay | Admitting: Internal Medicine

## 2021-02-26 DIAGNOSIS — M549 Dorsalgia, unspecified: Secondary | ICD-10-CM

## 2021-03-17 LAB — COLOGUARD: COLOGUARD: NEGATIVE

## 2021-03-17 NOTE — Progress Notes (Signed)
============================================================ °============================================================ ° °-     COLOGARD  Negative  Great!  -    Repeat in 3 years   ============================================================ ============================================================

## 2021-04-22 ENCOUNTER — Other Ambulatory Visit: Payer: Self-pay | Admitting: Adult Health

## 2021-05-11 ENCOUNTER — Other Ambulatory Visit: Payer: Self-pay | Admitting: Nurse Practitioner

## 2021-05-11 DIAGNOSIS — E349 Endocrine disorder, unspecified: Secondary | ICD-10-CM

## 2021-05-28 ENCOUNTER — Other Ambulatory Visit: Payer: Self-pay | Admitting: Internal Medicine

## 2021-05-28 ENCOUNTER — Other Ambulatory Visit: Payer: Self-pay | Admitting: Nurse Practitioner

## 2021-06-13 NOTE — Progress Notes (Signed)
MEDICARE ANNUAL WELLNESS VISIT AND FOLLOW UP ?Assessment:  ? ?Kevin Gomez was seen today for follow-up. ? ?Diagnoses and all orders for this visit: ? ?Initial Medicare annual wellness visit ? Due Annually ? Health Maintenance Reviewed ? Healthy lifestyle reviewed and goals set  ? ?Aortic atherosclerosis ([name]) by T-Spine Xray on 08/26/2020 ? ?Hereditary spherocytosis ([name]) ?CBC - monitor for anemia ? ?Bipolar disorder with depression ([name]) ?Is continuing to follow up with Dr. Magda Paganini, appears to be doing well on current medications.  ?Encouraged to work on stress, mindfulness, start exercising, and possibly follow up with counseling.  ? ?Attention deficit hyperactivity disorder (ADHD), predominantly inattentive type ?Continue medications and follow with Dr. Magda Paganini ? ?Essential hypertension ?- continue medications, DASH diet, exercise and monitor at home. Call if greater than 130/80.   ?-     CBC with Differential/Platelet ? ?Hyperlipidemia, mixed ?Continue diet and exercise ?-     COMPLETE METABOLIC PANEL WITH GFR ?-     Lipid panel ? ?Abnormal glucose ?Continue diet and exercise ? ?Hypothyroidism, unspecified type ?Please take your thyroid medication greater than 30 min before breakfast, separated by at least 4 hours  from antacids, calcium, iron, and multivitamins.  ? ? ?Vitamin D deficiency ?Continue Vit D supplementation to maintain value in therapeutic level of 60-100  ? ?Testosterone Deficiency ?- continue replacement therapy, check testosterone levels as needed. Defer today as just had injection.   ? ?Idiopathic gout, unspecified chronicity, unspecified site ?No recent flares ?Continue medication and check uric acid PRN ? ? ?Medication management ?-     Magnesium ? ?Hypercalcemia ?-     Parathyroid Hormone, Intact w/Ca ? ?Need for pneumococcal 20-valent conjugate vaccination ?-     Pneumococcal conjugate vaccine 20-valent (Prevnar 20) ? ?Mid back pain ?Continue Naproxen and Flexeril as needed ?If symptoms worsen  will refer to Ortho ?  ? ?Over 30 minutes of exam, counseling, chart review, and critical decision making was performed ? ?Future Appointments  ?Date Time Provider [name]  ?06/14/2021  9:30 AM Demetra Shiner, [place] Roger, NP [facility] None  ?09/15/2021  9:30 AM Unk Pinto, MD [facility] None  ?03/02/2022 10:00 AM Unk Pinto, MD [facility] None  ? ? ? ?Plan:  ? ?During the course of the visit the patient was educated and counseled about appropriate screening and preventive services including:  ? ?Pneumococcal vaccine  ?Influenza vaccine ?Prevnar 13 ?Td vaccine ?Screening electrocardiogram ?Colorectal cancer screening ?Diabetes screening ?Glaucoma screening ?Nutrition counseling  ? ? ?Subjective:  ?Kevin Gomez is a 66 y.o. male who presents for Medicare Annual Wellness Visit and 4 month follow up. He has Essential hypertension; Hereditary spherocytosis ([name]); ADD (attention deficit disorder); Abnormal glucose; Vitamin D deficiency; Hyperlipidemia, mixed; Testosterone Deficiency; Medication management; Gout; Bipolar disorder with depression ([name]); BMI 25.0-25.9,adult; Hypothyroidism; and Aortic atherosclerosis ([name]) by T-Spine Xray on 08/26/2020 on their problem list. ? ?He has been experiencing low back pain which he describes as an ache with occasional sharp pain with movement. No definite injury to his back. Meloxicam does not work as well as Naproxen so will use naproxen for back pain. Flexeril does also help to relieve somewhat.  ? ?BMI is Body mass index is 26.82 kg/m?., he has been working on diet and exercise. ?Wt Readings from Last 3 Encounters:  ?06/14/21 176 lb 6.4 oz (80 kg)  ?02/22/21 171 lb 3.2 oz (77.7 kg)  ?08/26/20 176 lb 12.8 oz (80.2 kg)  ? ? ? ?His blood pressure has been controlled at home, today their BP  is BP: 120/80  ?BP Readings from Last 3 Encounters:  ?06/14/21 120/80  ?02/22/21 128/72  ?08/26/20 113/78  ?  ?He does workout. He denies chest pain, shortness of breath, dizziness.   ? ? ?He is not on cholesterol medication and denies myalgias. His cholesterol is not at goal. The cholesterol last visit was:   ?Lab Results  ?Component Value Date  ? CHOL 157 02/22/2021  ? HDL 27 (L) 02/22/2021  ? [name]  02/22/2021  ?   Comment:  ?   . ?LDL cholesterol not calculated. Triglyceride levels ?greater than 400 mg/dL invalidate calculated LDL results. ?. ?Reference range: <100 ?Marland Kitchen ?Desirable range <100 mg/dL for primary prevention;   ?<70 mg/dL for patients with CHD or diabetic patients  ?with > or = 2 CHD risk factors. ?. ?LDL-C is now calculated using the Martin-Hopkins  ?calculation, which is a validated novel method providing  ?better accuracy than the Friedewald equation in the  ?estimation of LDL-C.  ?[name] Annamaria Helling. 0102;725(36): 2061-2068  ?(http://education.[employer].com/faq/FAQ164) ?  ? TRIG 439 (H) 02/22/2021  ? CHOLHDL 5.8 (H) 02/22/2021  ? ?He has been working on diet and exercise for abnormal glucose.  Last A1C in the office was:  ?Lab Results  ?Component Value Date  ? HGBA1C 5.2 02/22/2021  ? ?Last GFR ?Lab Results  ?Component Value Date  ? EGFR 70 02/22/2021  ? ?Patient is on Vitamin D supplement.   ?Lab Results  ?Component Value Date  ? VD25OH 62 02/22/2021  ?   ? ?Medication Review: ? ?Current Outpatient Medications (Endocrine & Metabolic):  ?  levothyroxine (SYNTHROID) 50 MCG tablet, TAKE 1 TABLET DAILY ON AN EMPTY STOMACH WITH ONLY WATER FOR 30 MINUTES & NO ANTACID MEDS, CALCIUM OR MAGNESIUM FOR 4 HOURS & AVOID BIOTIN ?  testosterone cypionate (DEPOTESTOSTERONE CYPIONATE) 200 MG/ML injection, INJECT 2 ML INTO MUSCLE EVERY 2 WEEKS ? ?Current Outpatient Medications (Cardiovascular):  ?  atenolol (TENORMIN) 100 MG tablet, TAKE 1 TABLET BY MOUTH EVERY DAY FOR BLOOD PRESSURE ?  lisinopril (ZESTRIL) 20 MG tablet, Take  1 tablet  Daily for BP ?  tadalafil (CIALIS) 20 MG tablet, Take  1/2 to 1 tablet  every 2 to 3 days  as needed for  XXXX ? ? ?Current Outpatient Medications  (Analgesics):  ?  allopurinol (ZYLOPRIM) 300 MG tablet, Take  1/2 tablet  Daily  to prevent Gout ?  meloxicam (MOBIC) 15 MG tablet, TAKE 1/2 TO 1 TABLET DAILY WITH FOOD AS NEEDED FOR PAIN & INFLAMMATION ? ? ?Current Outpatient Medications (Other):  ?  Cholecalciferol (VITAMIN D) 2000 UNITS tablet, Take 2,000 Units by mouth 4 (four) times daily.  ?  clonazePAM (KLONOPIN) 1 MG tablet, Take 1 mg by mouth 2 (two) times daily as needed. Takes up to 2 tabs daily prn ?  cyclobenzaprine (FLEXERIL) 10 MG tablet, Take 1/2 to 1 tablet  2 to 3 x /day  ONLY if needed for Muscle Spasm                                                       /                                 TAKE 1/2 TO 1 TABELT  2-3 TIME DAILM ?  diclofenac Sodium (VOLTAREN) 1 % GEL, APPLY 2 TO 4 GRAMS TOPICALLY TO PAINFUL JOINTS 2 TO 4 X /DAY ?  gabapentin (NEURONTIN) 800 MG tablet, TAKE 6 TABLETS DAILY AS DIRECTED FOR CHRONIC PAIN ?  methylphenidate (METADATE CD) 20 MG CR capsule,  ?  mirtazapine (REMERON) 45 MG tablet, Take 45 mg by mouth at bedtime. ?  NEEDLE, DISP, 21 G 21G X 1" MISC, Use for testosterone injections, 3 ml ?  Omega-3 Fatty Acids (FISH OIL) 1000 MG CAPS, Take 1,000 mg by mouth.  ?  risperiDONE (RISPERDAL) 2 MG tablet, Take 2 mg by mouth at bedtime. ?  sertraline (ZOLOFT) 100 MG tablet, Take 100 mg by mouth daily. ?  SYRINGE-NEEDLE, DISP, 3 ML (B-D 3CC LUER-LOK SYR 23GX1") 23G X 1" 3 ML MISC, Inject Depo-  Testosterone into muscle every week ? ?Allergies: ?Allergies  ?Allergen Reactions  ? Serzone [Nefazodone]   ?  Leg cramps  ? Trazamine [Trazodone & Diet Manage Prod] Other (See Comments)  ?  confusion  ? ? ?Current Problems (verified) ?has Essential hypertension; Hereditary spherocytosis ([place]); ADD (attention deficit disorder); Abnormal glucose; Vitamin D deficiency; Hyperlipidemia, mixed; Testosterone Deficiency; Medication management; Gout; Bipolar disorder with depression ([place]); BMI 25.0-25.9,adult; Hypothyroidism; and Aortic atherosclerosis  ([place]) by T-Spine Xray on 08/26/2020 on their problem list. ? ?Screening Tests ?Immunization History  ?Administered Date(s) Administered  ? Influenza Inj Mdck Quad With Preservative 12/06/2017  ? Influenza, Se

## 2021-06-14 ENCOUNTER — Encounter: Payer: Self-pay | Admitting: Nurse Practitioner

## 2021-06-14 ENCOUNTER — Ambulatory Visit: Payer: Medicare PPO | Admitting: Nurse Practitioner

## 2021-06-14 VITALS — BP 120/80 | HR 60 | Temp 97.9°F | Resp 16 | Ht 68.0 in | Wt 176.4 lb

## 2021-06-14 DIAGNOSIS — E559 Vitamin D deficiency, unspecified: Secondary | ICD-10-CM

## 2021-06-14 DIAGNOSIS — F900 Attention-deficit hyperactivity disorder, predominantly inattentive type: Secondary | ICD-10-CM

## 2021-06-14 DIAGNOSIS — R6889 Other general symptoms and signs: Secondary | ICD-10-CM

## 2021-06-14 DIAGNOSIS — Z79899 Other long term (current) drug therapy: Secondary | ICD-10-CM

## 2021-06-14 DIAGNOSIS — F319 Bipolar disorder, unspecified: Secondary | ICD-10-CM | POA: Diagnosis not present

## 2021-06-14 DIAGNOSIS — I10 Essential (primary) hypertension: Secondary | ICD-10-CM | POA: Diagnosis not present

## 2021-06-14 DIAGNOSIS — E039 Hypothyroidism, unspecified: Secondary | ICD-10-CM

## 2021-06-14 DIAGNOSIS — E782 Mixed hyperlipidemia: Secondary | ICD-10-CM

## 2021-06-14 DIAGNOSIS — M549 Dorsalgia, unspecified: Secondary | ICD-10-CM

## 2021-06-14 DIAGNOSIS — M1000 Idiopathic gout, unspecified site: Secondary | ICD-10-CM

## 2021-06-14 DIAGNOSIS — R7309 Other abnormal glucose: Secondary | ICD-10-CM

## 2021-06-14 DIAGNOSIS — Z23 Encounter for immunization: Secondary | ICD-10-CM

## 2021-06-14 DIAGNOSIS — N138 Other obstructive and reflux uropathy: Secondary | ICD-10-CM

## 2021-06-14 DIAGNOSIS — E291 Testicular hypofunction: Secondary | ICD-10-CM

## 2021-06-14 DIAGNOSIS — D580 Hereditary spherocytosis: Secondary | ICD-10-CM | POA: Diagnosis not present

## 2021-06-14 DIAGNOSIS — Z0000 Encounter for general adult medical examination without abnormal findings: Secondary | ICD-10-CM

## 2021-06-14 DIAGNOSIS — Z0001 Encounter for general adult medical examination with abnormal findings: Secondary | ICD-10-CM | POA: Diagnosis not present

## 2021-06-14 DIAGNOSIS — I700 Atherosclerosis of aorta: Secondary | ICD-10-CM

## 2021-06-15 LAB — COMPLETE METABOLIC PANEL WITH GFR
AG Ratio: 2.9 (calc) — ABNORMAL HIGH (ref 1.0–2.5)
ALT: 25 U/L (ref 9–46)
AST: 36 U/L — ABNORMAL HIGH (ref 10–35)
Albumin: 4.6 g/dL (ref 3.6–5.1)
Alkaline phosphatase (APISO): 65 U/L (ref 35–144)
BUN: 17 mg/dL (ref 7–25)
CO2: 30 mmol/L (ref 20–32)
Calcium: 9.8 mg/dL (ref 8.6–10.3)
Chloride: 98 mmol/L (ref 98–110)
Creat: 1.07 mg/dL (ref 0.70–1.35)
Globulin: 1.6 g/dL (calc) — ABNORMAL LOW (ref 1.9–3.7)
Glucose, Bld: 73 mg/dL (ref 65–99)
Potassium: 4.2 mmol/L (ref 3.5–5.3)
Sodium: 136 mmol/L (ref 135–146)
Total Bilirubin: 1.3 mg/dL — ABNORMAL HIGH (ref 0.2–1.2)
Total Protein: 6.2 g/dL (ref 6.1–8.1)
eGFR: 77 mL/min/{1.73_m2} (ref >=60)

## 2021-06-15 LAB — LIPID PANEL
Cholesterol: 131 mg/dL (ref <200)
HDL: 27 mg/dL — ABNORMAL LOW (ref >=40)
LDL Cholesterol (Calc): 67 mg/dL (calc)
Non-HDL Cholesterol (Calc): 104 mg/dL (calc) (ref <130)
Total CHOL/HDL Ratio: 4.9 (calc) (ref <5.0)
Triglycerides: 280 mg/dL — ABNORMAL HIGH (ref <150)

## 2021-06-15 LAB — CBC WITH DIFFERENTIAL/PLATELET
Absolute Monocytes: 482 cells/uL (ref 200–950)
Basophils Absolute: 36 cells/uL (ref 0–200)
Basophils Relative: 0.4 %
Eosinophils Absolute: 82 cells/uL (ref 15–500)
Eosinophils Relative: 0.9 %
HCT: 41.2 % (ref 38.5–50.0)
Hemoglobin: 12.7 g/dL — ABNORMAL LOW (ref 13.2–17.1)
Lymphs Abs: 2184 cells/uL (ref 850–3900)
MCH: 20.4 pg — ABNORMAL LOW (ref 27.0–33.0)
MCHC: 30.8 g/dL — ABNORMAL LOW (ref 32.0–36.0)
MCV: 66.2 fL — ABNORMAL LOW (ref 80.0–100.0)
Monocytes Relative: 5.3 %
Neutro Abs: 6315 cells/uL (ref 1500–7800)
Neutrophils Relative %: 69.4 %
Platelets: 192 10*3/uL (ref 140–400)
RBC: 6.22 10*6/uL — ABNORMAL HIGH (ref 4.20–5.80)
RDW: 19.7 % — ABNORMAL HIGH (ref 11.0–15.0)
Total Lymphocyte: 24 %
WBC: 9.1 10*3/uL (ref 3.8–10.8)

## 2021-06-15 LAB — CBC MORPHOLOGY

## 2021-06-15 LAB — PTH, INTACT AND CALCIUM
Calcium: 9.8 mg/dL (ref 8.6–10.3)
PTH: 29 pg/mL (ref 16–77)

## 2021-06-15 LAB — MAGNESIUM: Magnesium: 1.9 mg/dL (ref 1.5–2.5)

## 2021-08-01 ENCOUNTER — Other Ambulatory Visit: Payer: Self-pay | Admitting: Internal Medicine

## 2021-08-03 ENCOUNTER — Other Ambulatory Visit: Payer: Self-pay | Admitting: Internal Medicine

## 2021-08-03 DIAGNOSIS — M1000 Idiopathic gout, unspecified site: Secondary | ICD-10-CM

## 2021-08-09 DIAGNOSIS — M545 Low back pain, unspecified: Secondary | ICD-10-CM | POA: Diagnosis not present

## 2021-08-11 DIAGNOSIS — M545 Low back pain, unspecified: Secondary | ICD-10-CM | POA: Diagnosis not present

## 2021-08-12 DIAGNOSIS — F410 Panic disorder [episodic paroxysmal anxiety] without agoraphobia: Secondary | ICD-10-CM | POA: Diagnosis not present

## 2021-08-12 DIAGNOSIS — F419 Anxiety disorder, unspecified: Secondary | ICD-10-CM | POA: Diagnosis not present

## 2021-08-12 DIAGNOSIS — G4726 Circadian rhythm sleep disorder, shift work type: Secondary | ICD-10-CM | POA: Diagnosis not present

## 2021-08-12 DIAGNOSIS — F3341 Major depressive disorder, recurrent, in partial remission: Secondary | ICD-10-CM | POA: Diagnosis not present

## 2021-08-13 ENCOUNTER — Other Ambulatory Visit: Payer: Self-pay | Admitting: Adult Health

## 2021-08-13 DIAGNOSIS — E349 Endocrine disorder, unspecified: Secondary | ICD-10-CM

## 2021-08-16 DIAGNOSIS — M545 Low back pain, unspecified: Secondary | ICD-10-CM | POA: Diagnosis not present

## 2021-08-18 DIAGNOSIS — M545 Low back pain, unspecified: Secondary | ICD-10-CM | POA: Diagnosis not present

## 2021-08-23 DIAGNOSIS — M545 Low back pain, unspecified: Secondary | ICD-10-CM | POA: Diagnosis not present

## 2021-08-25 DIAGNOSIS — M545 Low back pain, unspecified: Secondary | ICD-10-CM | POA: Diagnosis not present

## 2021-09-01 ENCOUNTER — Other Ambulatory Visit: Payer: Self-pay | Admitting: Internal Medicine

## 2021-09-01 DIAGNOSIS — M549 Dorsalgia, unspecified: Secondary | ICD-10-CM

## 2021-09-13 DIAGNOSIS — M545 Low back pain, unspecified: Secondary | ICD-10-CM | POA: Diagnosis not present

## 2021-09-14 NOTE — Progress Notes (Signed)
Future Appointments  Date Time Provider Department  09/15/2021  9:30 AM [name], MD [facility]  03/02/2022 10:00 AM [name], MD [facility]    History of Present Illness:       This very nice 66 y.o.m [name] presents for a 6 month follow up with HTN, HLD, Pre-Diabetes, Hypothyroidism, Testosterone Deficiency  and Vitamin D Deficiency.  Patient has hx/o Gout controlled on his meds. Patient is on Metadate-CD for ADD with improved focus & Concentration.  Patient also has hx/o Hereditary Spherocytosis w/o anemia.                                                                  Patient is treated for HTN (1998) & BP has been controlled and today's BP is  at goal - 121/83 .   Patient has had no complaints of any cardiac type chest pain, palpitations, dyspnea / orthopnea / PND, dizziness, claudication, or dependent edema.       Hyperlipidemia is controlled with diet .   Last Lipids were at goal except elevated Trig's :  Lab Results  Component Value Date   CHOL 131 06/14/2021   HDL 27 (L) 06/14/2021   LDLCALC 67 06/14/2021   TRIG 280 (H) 06/14/2021   CHOLHDL 4.9 06/14/2021    Also, the patient has history of PreDiabetes (A1c 5.8%/2010) and has had no symptoms of reactive hypoglycemia, diabetic polys, paresthesias or visual blurring.  Last A1c was normal & at goal:  Lab Results  Component Value Date   HGBA1C 5.2 02/22/2021        Patient was  dx'd Hypothyroid  and started on Thyroid Replacement in Feb 2021.  Patient also has been on parenteral  Testosterone replacement for hx/o Testosterone Deficiency  with improved Stamina  & sense of well being.                                             Patient has hx/o Testosterone Deficiency has been on parenteral replacement with improved Stamina  & sense of well being.        Further, the patient also has history of Vitamin D  Deficiency ("31" /2008) and supplements vitamin D without any suspected side-effects. Last vitamin D was at goal:  Lab Results  Component Value Date   VD25OH 62 02/22/2021      Current Outpatient Medications:    allopurinol (ZYLOPRIM) 300 MG tablet, TAKE 1/2 TABLET DAILY TO PREVENT GOUT, Disp: 45 tablet, Rfl: 3   atenolol (TENORMIN) 100 MG tablet, TAKE 1 TABLET BY MOUTH EVERY DAY FOR BLOOD PRESSURE, Disp: 90 tablet, Rfl: 3   Cholecalciferol (VITAMIN D) 2000 UNITS tablet, Take 2,000 Units by mouth 4 (four) times daily. , Disp: , Rfl:    clonazePAM (KLONOPIN) 1 MG tablet, Take 1 mg by mouth 2 (two) times daily as needed. Takes up to 2 tabs daily prn, Disp: , Rfl:    cyclobenzaprine (FLEXERIL) 10 MG tablet, TAKE 1/2 TO 1 TABLET 2 TO 3 X DAY ONLY IF NEEDED FOR MUSCLE SPASM, Disp: 270 tablet, Rfl: 1   diclofenac Sodium (VOLTAREN) 1 % GEL, APPLY  2 TO 4 GRAMS TOPICALLY TO PAINFUL JOINTS 2 TO 4 X /DAY, Disp: 100 g, Rfl: 3   gabapentin (NEURONTIN) 800 MG tablet, TAKE 6 TABLETS DAILY AS DIRECTED FOR CHRONIC PAIN, Disp: 540 tablet, Rfl: 1   levothyroxine (SYNTHROID) 50 MCG tablet, TAKE 1 TABLET DAILY ON AN EMPTY STOMACH WITH ONLY WATER FOR 30 MINUTES & NO ANTACID MEDS, CALCIUM OR MAGNESIUM FOR 4 HOURS & AVOID BIOTIN, Disp: 90 tablet, Rfl: 3   lisinopril (ZESTRIL) 20 MG tablet, TAKE 1 TABLET DAILY FOR BLOOD PRESSURE, Disp: 90 tablet, Rfl: 3   meloxicam (MOBIC) 15 MG tablet, TAKE 1/2 TO 1 TABLET DAILY WITH FOOD AS NEEDED FOR PAIN & INFLAMMATION, Disp: 90 tablet, Rfl: 3   methylphenidate (METADATE CD) 20 MG CR capsule, , Disp: , Rfl:    mirtazapine (REMERON) 45 MG tablet, Take 45 mg by mouth at bedtime., Disp: , Rfl:    NEEDLE, DISP, 21 G 21G X 1" MISC, Use for testosterone injections, 3 ml, Disp: 50 each, Rfl: 1   Omega-3 Fatty Acids (FISH OIL) 1000 MG CAPS, Take 1,000 mg by mouth. , Disp: , Rfl:    SYRINGE-NEEDLE, DISP, 3 ML (B-D 3CC LUER-LOK SYR 23GX1") 23G X 1" 3 ML MISC, Inject Depo-  Testosterone into  muscle every week, Disp: 50 each, Rfl: 1   tadalafil (CIALIS) 20 MG tablet, Take  1/2 to 1 tablet  every 2 to 3 days  as needed for  XXXX, Disp: 30 tablet, Rfl: 1   testosterone cypionate (DEPOTESTOSTERONE CYPIONATE) 200 MG/ML injection, Inject 1 ml  into  Muscle  every  week, Disp: 10 mL, Rfl: 1   risperiDONE (RISPERDAL) 2 MG tablet, Take 2 mg by mouth at bedtime. (Patient not taking: Reported on 09/15/2021), Disp: , Rfl:    sertraline (ZOLOFT) 100 MG tablet, Take 100 mg by mouth daily. (Patient not taking: Reported on 09/15/2021), Disp: , Rfl:      Allergies  Allergen Reactions   Serzone [Nefazodone] Leg cramps       Trazamine [Trazodone & Diet Manage Prod] confusion        PMHx:   Past Medical History:  Diagnosis Date   ADD (attention deficit disorder)    Anemia    Depression    Elevated hemoglobin A1c    Hyperlipidemia    Hypertension    Vitamin D deficiency      Immunization History  Administered Date(s) Administered   Influenza Inj Mdck Quad  12/06/2017   Influenza, Seasonal,  12/08/2016   Influenza,inj,Quad PF,6+ Mos 12/08/2013   Influenza- 12/06/2015, 12/07/2016, 10/22/2018, 12/05/2019   Janssen (J&J) SARS-COV-2 Vacci 05/16/2019   PPD Test 07/23/2013, 07/24/2014, 08/09/2015, 08/30/2016, 10/23/2017, 11/26/2018, 12/25/2019   Pneumococcal-23 03/06/2009   Td 03/24/2012     Past Surgical History:  Procedure Laterality Date   FRACTURE SURGERY     ORTHOPEDIC SURGERY Left    left foot pinning   ORTHOPEDIC SURGERY Left    left foot pinning   ORTHOPEDIC SURGERY Right    right elbow bursectomy  1984   ORTHOPEDIC SURGERY Right 2002 dr supple   right rotator cuff    FHx:    Reviewed / unchanged  SHx:    Reviewed / unchanged   Systems Review:  Constitutional: Denies fever, chills, wt changes, headaches, insomnia, fatigue, night sweats, change in appetite. Eyes: Denies redness, blurred vision, diplopia, discharge, itchy, watery eyes.  ENT: Denies discharge,  congestion, post nasal drip, epistaxis, sore throat, earache, hearing loss, dental pain, tinnitus,  vertigo, sinus pain, snoring.  CV: Denies chest pain, palpitations, irregular heartbeat, syncope, dyspnea, diaphoresis, orthopnea, PND, claudication or edema. Respiratory: denies cough, dyspnea, DOE, pleurisy, hoarseness, laryngitis, wheezing.  Gastrointestinal: Denies dysphagia, odynophagia, heartburn, reflux, water brash, abdominal pain or cramps, nausea, vomiting, bloating, diarrhea, constipation, hematemesis, melena, hematochezia  or hemorrhoids. Genitourinary: Denies dysuria, frequency, urgency, nocturia, hesitancy, discharge, hematuria or flank pain. Musculoskeletal: Denies arthralgias, myalgias, stiffness, jt. swelling, pain, limping or strain/sprain.  Skin: Denies pruritus, rash, hives, warts, acne, eczema or change in skin lesion(s). Neuro: No weakness, tremor, incoordination, spasms, paresthesia or pain. Psychiatric: Denies confusion, memory loss or sensory loss. Endo: Denies change in weight, skin or hair change.  Heme/Lymph: No excessive bleeding, bruising or enlarged lymph nodes.  Physical Exam  BP 121/83   Pulse (!) 53   Temp (!) 96.9 F (36.1 C)   Resp 16   Ht 5\' 8"  (1.727 m)   Wt 167 lb 12.8 oz (76.1 kg)   SpO2 99%   BMI 25.51 kg/m   Appears  well nourished, well groomed  and in no distress.  Eyes: PERRLA, EOMs, conjunctiva no swelling or erythema. Sinuses: No frontal/maxillary tenderness ENT/Mouth: EAC's clear, TM's nl w/o erythema, bulging. Nares clear w/o erythema, swelling, exudates. Oropharynx clear without erythema or exudates. Oral hygiene is good. Tongue normal, non obstructing. Hearing intact.  Neck: Supple. Thyroid not palpable. Car 2+/2+ without bruits, nodes or JVD. Chest: Respirations nl with BS clear & equal w/o rales, rhonchi, wheezing or stridor.  Cor: Heart sounds normal w/ regular rate and rhythm without sig. murmurs, gallops, clicks or rubs. Peripheral  pulses normal and equal  without edema.  Abdomen: Soft & bowel sounds normal. Non-tender w/o guarding, rebound, hernias, masses or organomegaly.  Lymphatics: Unremarkable.  Musculoskeletal: Full ROM all peripheral extremities, joint stability, 5/5 strength and normal gait.  Skin: Warm, dry without exposed rashes, lesions or ecchymosis apparent.  Neuro: Cranial nerves intact, reflexes equal bilaterally. Sensory-motor testing grossly intact. Tendon reflexes grossly intact.  Pysch: Alert & oriented x 3.  Insight and judgement nl & appropriate. No ideations.  Assessment and Plan:  Essential hypertension  -     CBC with Differential/Platelet -     COMPLETE METABOLIC PANEL WITH GFR -     Magnesium -     TSH  Hyperlipidemia, mixed  -     Lipid panel -     TSH  Abnormal glucose  -     Hemoglobin A1c -     Insulin, random  Vitamin D deficiency  -     VITAMIN D 25 Hydroxy   Hypothyroidism  -     TSH  Aortic atherosclerosis (HCC) by T-Spine Xray on 08/26/2020  -     Lipid panel  Testosterone Deficiency  -     Testosterone  Idiopathic gout  -     Uric acid  Medication management -     CBC with Differential/Platelet -     COMPLETE METABOLIC PANEL WITH GFR -     Magnesium -     Lipid panel -     TSH -     Hemoglobin A1c -     Insulin, random -     VITAMIN D 25 Hydroxy  -     Uric acid -     Testosterone         Discussed  regular exercise, BP monitoring, weight control to achieve/maintain BMI less than 25 and discussed med and SE's. Recommended labs to  assess and monitor clinical status with further disposition pending results of labs.  I discussed the assessment and treatment plan with the patient. The patient was provided an opportunity to ask questions and all were answered. The patient agreed with the plan and demonstrated an understanding of the instructions.  I provided over 30 minutes of exam, counseling, chart review and  complex critical decision  making.         The patient was advised to call back or seek an in-person evaluation if the symptoms worsen or if the condition fails to improve as anticipated.   Marinus Maw, MD

## 2021-09-15 ENCOUNTER — Ambulatory Visit: Payer: Medicare PPO | Admitting: Internal Medicine

## 2021-09-15 ENCOUNTER — Encounter: Payer: Self-pay | Admitting: Internal Medicine

## 2021-09-15 VITALS — BP 121/83 | HR 53 | Temp 96.9°F | Resp 16 | Ht 68.0 in | Wt 167.8 lb

## 2021-09-15 DIAGNOSIS — E039 Hypothyroidism, unspecified: Secondary | ICD-10-CM

## 2021-09-15 DIAGNOSIS — E782 Mixed hyperlipidemia: Secondary | ICD-10-CM

## 2021-09-15 DIAGNOSIS — I700 Atherosclerosis of aorta: Secondary | ICD-10-CM

## 2021-09-15 DIAGNOSIS — R7309 Other abnormal glucose: Secondary | ICD-10-CM

## 2021-09-15 DIAGNOSIS — E291 Testicular hypofunction: Secondary | ICD-10-CM | POA: Diagnosis not present

## 2021-09-15 DIAGNOSIS — E559 Vitamin D deficiency, unspecified: Secondary | ICD-10-CM

## 2021-09-15 DIAGNOSIS — M1000 Idiopathic gout, unspecified site: Secondary | ICD-10-CM

## 2021-09-15 DIAGNOSIS — I10 Essential (primary) hypertension: Secondary | ICD-10-CM

## 2021-09-15 DIAGNOSIS — Z79899 Other long term (current) drug therapy: Secondary | ICD-10-CM | POA: Diagnosis not present

## 2021-09-15 NOTE — Patient Instructions (Signed)

## 2021-09-16 LAB — CBC WITH DIFFERENTIAL/PLATELET
Absolute Monocytes: 627 cells/uL (ref 200–950)
Basophils Absolute: 45 cells/uL (ref 0–200)
Basophils Relative: 0.4 %
Eosinophils Absolute: 90 cells/uL (ref 15–500)
Eosinophils Relative: 0.8 %
HCT: 47.6 % (ref 38.5–50.0)
Hemoglobin: 15 g/dL (ref 13.2–17.1)
Lymphs Abs: 3270 cells/uL (ref 850–3900)
MCH: 20.4 pg — ABNORMAL LOW (ref 27.0–33.0)
MCHC: 31.5 g/dL — ABNORMAL LOW (ref 32.0–36.0)
MCV: 64.6 fL — ABNORMAL LOW (ref 80.0–100.0)
Monocytes Relative: 5.6 %
Neutro Abs: 7168 cells/uL (ref 1500–7800)
Neutrophils Relative %: 64 %
Platelets: 208 10*3/uL (ref 140–400)
RBC: 7.37 10*6/uL — ABNORMAL HIGH (ref 4.20–5.80)
RDW: 20.5 % — ABNORMAL HIGH (ref 11.0–15.0)
Total Lymphocyte: 29.2 %
WBC: 11.2 10*3/uL — ABNORMAL HIGH (ref 3.8–10.8)

## 2021-09-16 LAB — LIPID PANEL
Cholesterol: 164 mg/dL (ref <200)
HDL: 30 mg/dL — ABNORMAL LOW (ref >=40)
LDL Cholesterol (Calc): 93 mg/dL (calc)
Non-HDL Cholesterol (Calc): 134 mg/dL (calc) — ABNORMAL HIGH (ref <130)
Total CHOL/HDL Ratio: 5.5 (calc) — ABNORMAL HIGH (ref <5.0)
Triglycerides: 286 mg/dL — ABNORMAL HIGH (ref <150)

## 2021-09-16 LAB — URIC ACID: Uric Acid, Serum: 6.8 mg/dL (ref 4.0–8.0)

## 2021-09-16 LAB — COMPLETE METABOLIC PANEL WITH GFR
AG Ratio: 2.5 (calc) (ref 1.0–2.5)
ALT: 22 U/L (ref 9–46)
AST: 41 U/L — ABNORMAL HIGH (ref 10–35)
Albumin: 4.8 g/dL (ref 3.6–5.1)
Alkaline phosphatase (APISO): 69 U/L (ref 35–144)
BUN: 16 mg/dL (ref 7–25)
CO2: 31 mmol/L (ref 20–32)
Calcium: 10.3 mg/dL (ref 8.6–10.3)
Chloride: 99 mmol/L (ref 98–110)
Creat: 1.25 mg/dL (ref 0.70–1.35)
Globulin: 1.9 g/dL (calc) (ref 1.9–3.7)
Glucose, Bld: 65 mg/dL (ref 65–99)
Potassium: 4 mmol/L (ref 3.5–5.3)
Sodium: 138 mmol/L (ref 135–146)
Total Bilirubin: 1.9 mg/dL — ABNORMAL HIGH (ref 0.2–1.2)
Total Protein: 6.7 g/dL (ref 6.1–8.1)
eGFR: 64 mL/min/{1.73_m2} (ref >=60)

## 2021-09-16 LAB — HEMOGLOBIN A1C
Hgb A1c MFr Bld: 5.1 % of total Hgb (ref <5.7)
Mean Plasma Glucose: 100 mg/dL
eAG (mmol/L): 5.5 mmol/L

## 2021-09-16 LAB — TSH: TSH: 1.27 mIU/L (ref 0.40–4.50)

## 2021-09-16 LAB — MAGNESIUM: Magnesium: 2 mg/dL (ref 1.5–2.5)

## 2021-09-16 LAB — INSULIN, RANDOM: Insulin: 4.3 u[IU]/mL

## 2021-09-16 LAB — TESTOSTERONE: Testosterone: 2145 ng/dL — ABNORMAL HIGH (ref 250–827)

## 2021-09-16 LAB — VITAMIN D 25 HYDROXY (VIT D DEFICIENCY, FRACTURES): Vit D, 25-Hydroxy: 52 ng/mL (ref 30–100)

## 2021-09-17 NOTE — Progress Notes (Signed)
<><><><><><><><><><><><><><><><><><><><><><><><><><><><><><><><><> <><><><><><><><><><><><><><><><><><><><><><><><><><><><><><><><><> - Test results slightly outside the reference range are not unusual. If there is anything important, I will review this with you,  otherwise it is considered normal test values.  If you have further questions,  please do not hesitate to contact me at the office or via My Chart.  <><><><><><><><><><><><><><><><><><><><><><><><><><><><><><><><><> <><><><><><><><><><><><><><><><><><><><><><><><><><><><><><><><><>  -  Total  Chol =   164    - Great            (  Ideal  or  Goal is less than 180  !  )  & -  Bad / Dangerous LDL  Chol =  93  -  Elevated              (  Ideal  or  Goal is less than 70  !  )   - Recommend low cholesterol diet   - Cholesterol only comes from animal sources                                                                            - ie. meat, dairy, egg yolks  - Eat all the vegetables you want.  - Avoid Meat,                Avoid Meat,                  Avoid Meat                                                        - especially Red Meat - Beef AND Pork .  - Avoid cheese & dairy - milk & ice cream.     - Cheese is the most concentrated form of trans-fats which                                                        is the worst thing to clog up our arteries.   - Veggie cheese is OK which can be found in the fresh produce section                                                       at [employer] or [employer] or [employer]  - Also Triglycerides (   286    ) or fats in blood are too high                 (   Ideal or  Goal is less than 150  !  )    - Recommend avoid fried & greasy foods,  sweets / candy,   - Avoid white rice  (brown or wild rice or Quinoa is OK),   - Avoid  white potatoes  (sweet potatoes are OK)   - Avoid anything made from white flour  - bagels, doughnuts, rolls, buns, biscuits, white and   wheat  breads, pizza crust and traditional  pasta made of white flour & egg white  - (vegetarian pasta or spinach or wheat pasta is OK).    - Multi-grain bread is OK - like multi-grain flat bread or  sandwich thins.   - Avoid alcohol in excess.   - Exercise is also important. <><><><><><><><><><><><><><><><><><><><><><><><><><><><><><><><><> <><><><><><><><><><><><><><><><><><><><><><><><><><><><><><><><><>  -  A1c - Normal - No Diabetes  - Great ! <><><><><><><><><><><><><><><><><><><><><><><><><><><><><><><><><> <><><><><><><><><><><><><><><><><><><><><><><><><><><><><><><><><>  -  Vitamin D = 52 - Sl low   - Vitamin D goal is between 70-100.   - Please INCREASE  your Vitamin D from 8,000 up to 10,000 units  /day   - It is very important as a natural anti-inflammatory and helping the  immune system protect against viral infections, like the Covid-19    helping hair, skin, and nails, as well as reducing stroke and  heart attack risk.   - It helps your bones and helps with mood.  - It also decreases numerous cancer risks so please  take it as directed.   - Low Vit D is associated with a 200-300% higher risk for  CANCER   and 200-300% higher risk for HEART   ATTACK  &  STROKE.    - It is also associated with higher death rate at younger ages,   autoimmune diseases like Rheumatoid arthritis, Lupus,  Multiple Sclerosis.     - Also many other serious conditions, like depression, Alzheimer's  Dementia, infertility, muscle aches, fatigue, fibromyalgia   <><><><><><><><><><><><><><><><><><><><><><><><><><><><><><><><><> <><><><><><><><><><><><><><><><><><><><><><><><><><><><><><><><><>  - Uric Acid / Gout test is Normal - Please continue Allopurinol <><><><><><><><><><><><><><><><><><><><><><><><><><><><><><><><><> <><><><><><><><><><><><><><><><><><><><><><><><><><><><><><><><><>  -  Testosterone = 2,145   is Way too high                     (Normal range is between 250   -  827)   Levels this high are at very high risk for increasing  " clotting"   of blood &                                                                          causing a Stroke or a Heart Attack   - So, Strongly recommend decreasing  dose from                                                                            1 ml down to 1/2 ml weekly <><><><><><><><><><><><><><><><><><><><><><><><><><><><><><><><><> <><><><><><><><><><><><><><><><><><><><><><><><><><><><><><><><><>

## 2021-09-18 ENCOUNTER — Encounter: Payer: Self-pay | Admitting: Internal Medicine

## 2021-09-27 DIAGNOSIS — M545 Low back pain, unspecified: Secondary | ICD-10-CM | POA: Diagnosis not present

## 2021-09-29 DIAGNOSIS — M545 Low back pain, unspecified: Secondary | ICD-10-CM | POA: Diagnosis not present

## 2021-10-04 DIAGNOSIS — M545 Low back pain, unspecified: Secondary | ICD-10-CM | POA: Diagnosis not present

## 2021-10-06 DIAGNOSIS — M545 Low back pain, unspecified: Secondary | ICD-10-CM | POA: Diagnosis not present

## 2021-10-11 DIAGNOSIS — M545 Low back pain, unspecified: Secondary | ICD-10-CM | POA: Diagnosis not present

## 2021-10-13 DIAGNOSIS — M545 Low back pain, unspecified: Secondary | ICD-10-CM | POA: Diagnosis not present

## 2021-10-18 DIAGNOSIS — M545 Low back pain, unspecified: Secondary | ICD-10-CM | POA: Diagnosis not present

## 2021-10-20 DIAGNOSIS — M545 Low back pain, unspecified: Secondary | ICD-10-CM | POA: Diagnosis not present

## 2021-10-25 DIAGNOSIS — M545 Low back pain, unspecified: Secondary | ICD-10-CM | POA: Diagnosis not present

## 2021-11-03 DIAGNOSIS — M545 Low back pain, unspecified: Secondary | ICD-10-CM | POA: Diagnosis not present

## 2021-11-08 DIAGNOSIS — M545 Low back pain, unspecified: Secondary | ICD-10-CM | POA: Diagnosis not present

## 2021-11-15 DIAGNOSIS — M545 Low back pain, unspecified: Secondary | ICD-10-CM | POA: Diagnosis not present

## 2021-11-18 DIAGNOSIS — M545 Low back pain, unspecified: Secondary | ICD-10-CM | POA: Diagnosis not present

## 2021-11-20 ENCOUNTER — Other Ambulatory Visit: Payer: Self-pay | Admitting: Internal Medicine

## 2021-11-22 DIAGNOSIS — M545 Low back pain, unspecified: Secondary | ICD-10-CM | POA: Diagnosis not present

## 2021-12-27 ENCOUNTER — Ambulatory Visit: Payer: Medicare PPO | Admitting: Nurse Practitioner

## 2021-12-27 ENCOUNTER — Encounter: Payer: Self-pay | Admitting: Nurse Practitioner

## 2021-12-27 VITALS — BP 104/76 | HR 43 | Temp 97.1°F | Ht 68.0 in | Wt 155.0 lb

## 2021-12-27 DIAGNOSIS — Z79899 Other long term (current) drug therapy: Secondary | ICD-10-CM | POA: Diagnosis not present

## 2021-12-27 DIAGNOSIS — D580 Hereditary spherocytosis: Secondary | ICD-10-CM

## 2021-12-27 DIAGNOSIS — E039 Hypothyroidism, unspecified: Secondary | ICD-10-CM

## 2021-12-27 DIAGNOSIS — F900 Attention-deficit hyperactivity disorder, predominantly inattentive type: Secondary | ICD-10-CM

## 2021-12-27 DIAGNOSIS — I700 Atherosclerosis of aorta: Secondary | ICD-10-CM | POA: Diagnosis not present

## 2021-12-27 DIAGNOSIS — E559 Vitamin D deficiency, unspecified: Secondary | ICD-10-CM

## 2021-12-27 DIAGNOSIS — E291 Testicular hypofunction: Secondary | ICD-10-CM

## 2021-12-27 DIAGNOSIS — R7309 Other abnormal glucose: Secondary | ICD-10-CM

## 2021-12-27 DIAGNOSIS — M549 Dorsalgia, unspecified: Secondary | ICD-10-CM

## 2021-12-27 DIAGNOSIS — F319 Bipolar disorder, unspecified: Secondary | ICD-10-CM

## 2021-12-27 DIAGNOSIS — R17 Unspecified jaundice: Secondary | ICD-10-CM

## 2021-12-27 DIAGNOSIS — E782 Mixed hyperlipidemia: Secondary | ICD-10-CM

## 2021-12-27 DIAGNOSIS — I10 Essential (primary) hypertension: Secondary | ICD-10-CM

## 2021-12-27 DIAGNOSIS — M1000 Idiopathic gout, unspecified site: Secondary | ICD-10-CM

## 2021-12-27 DIAGNOSIS — R7401 Elevation of levels of liver transaminase levels: Secondary | ICD-10-CM

## 2021-12-27 NOTE — Progress Notes (Signed)
FOLLOW UP Assessment:   Kevin Gomez was seen today for follow-up.  Diagnoses and all orders for this visit:  Aortic atherosclerosis ([place]) by T-Spine Xray on 08/26/2020  Hereditary spherocytosis (HCC) CBC - monitor for anemia  Bipolar disorder with depression ([place]) Followed by Dr. Magda Paganini, [facility] Continue current medications.  Encouraged to work on stress, mindfulness, start exercising, and possibly follow up with counseling.   Attention deficit hyperactivity disorder (ADHD), predominantly inattentive type Continue medications and follow with Dr. Magda Paganini Check and monitor PDMP Discussed safety protocols for controlled substances.  Essential hypertension If BP continues to remain <110/70 discussed adjusting Lisinopril to 1/2 tab (10 mg) QD If HR continues to remain <60 can start to taper off Atenolol. Discussed DASH (Dietary Approaches to Stop Hypertension) DASH diet is lower in sodium than a typical American diet. Continue to monitor foods high in saturated fat, cholesterol, and trans fats. Eat more whole-grain foods, fish, poultry, and nuts Remain active and exercise as tolerated daily.  Check and monitor CMP/CBC   Hyperlipidemia, mixed Discussed lifestyle modifications. Recommended diet heavy in fruits and veggies, omega 3's. Decrease consumption of animal meats, cheeses, and dairy products. Remain active and exercise as tolerated. Continue to monitor. Check and monitor lipids/TSH  Abnormal glucose Education: Reviewed 'ABCs' of diabetes management  Discussed goals to be met and/or maintained include A1C (<7) Blood pressure (<130/80) Cholesterol (LDL <70) Continue Eye Exam yearly  Continue Dental Exam Q6 mo Discussed dietary recommendations Discussed Physical Activity recommendations Foot exam UTD Check and monitor A1C   Hypothyroidism, unspecified type Controlled. Continue Levothyroxine. Reminded to take on an empty stomach 30-29mns before food.   Stop any Biotin Supplement 48-72 hours before next TSH level to reduce the risk of falsely low TSH levels. Continue to monitor.    Vitamin D deficiency Continue Vit D supplementation  Check and monitor levels  Testosterone Deficiency Continue replacement therapy, Check testosterone levels as needed.   Idiopathic gout, unspecified chronicity, unspecified site No recent flares Continue medication  Discussed low purine diet  Medication management All medications discussed and reviewed in full. All questions and concerns regarding medications addressed.    Hypercalcemia Parathyroid Hormone, Intact w/Ca Continue to monitor  Mid back pain Controlled Continue Naproxen and Flexeril PRN Possible Ortho referral if s/s fail to improve   Elevated AST/Bilirubin Monitor levels  Orders Placed This Encounter  Procedures   CBC with Differential/Platelet   COMPLETE METABOLIC PANEL WITH GFR   Lipid panel   Over 20 minutes of exam, counseling, chart review, and critical decision making was performed  Future Appointments  Date Time Provider [place] 04/17/2022 10:00 AM MUnk Pinto MD [facility] None     Subjective:  MClinton Dragoneis a 66y.o. male who presents for Medicare Annual Wellness Visit and 4 month follow up. He has Essential hypertension; Hereditary spherocytosis ([place]; ADD (attention deficit disorder); Abnormal glucose; Vitamin D deficiency; Hyperlipidemia, mixed; Testosterone Deficiency; Medication management; Gout; Bipolar disorder with depression ([place]; BMI 25.0-25.9,adult; Hypothyroidism; and [place]ortic atherosclerosis ([place] by T-Spine Xray on 08/26/2020 on their problem list.  Overall reports doing well.    Reports he just donated blood 12/12/2021.  He will be completing his last night shift work this week.  He is looking forward to spending more time at home completing projects and getting back on a day time schedule.  He continues to experience low back  pain, achey in nature, sharp shooting pains intermittent. No definite injury to his back. Has tried Meloxicam  however Naproxen works best. He continues Flexeril PRN. He has been completing exercises taught by physcial therapy to help control bback pain.  BMI is Body mass index is 23.57 kg/m., he has been working on diet and exercise. Wt Readings from Last 3 Encounters:  12/27/21 155 lb (70.3 kg)  09/15/21 167 lb 12.8 oz (76.1 kg)  06/14/21 176 lb 6.4 oz (80 kg)     His blood pressure has been controlled at home, today their BP is BP: 104/76  Blood pressure and HR are low in office.  He is not symptomatic.  He just donated blood 12/12/21.   BP Readings from Last 3 Encounters:  12/27/21 104/76  09/15/21 121/83  06/14/21 120/80    He does workout. He denies chest pain, shortness of breath, dizziness.   He is not on cholesterol medication and denies myalgias. His cholesterol is not at goal. The cholesterol last visit was:   Lab Results  Component Value Date   CHOL 164 09/15/2021   HDL 30 (L) 09/15/2021   LDLCALC 93 09/15/2021   TRIG 286 (H) 09/15/2021   CHOLHDL 5.5 (H) 09/15/2021   He has been working on diet and exercise for abnormal glucose.  Last A1C in the office was:  Lab Results  Component Value Date   HGBA1C 5.1 09/15/2021   Last GFR Lab Results  Component Value Date   EGFR 64 09/15/2021   Patient is on Vitamin D supplement.   Lab Results  Component Value Date   VD25OH 52 09/15/2021      Medication Review:  Current Outpatient Medications (Endocrine & Metabolic):    levothyroxine (SYNTHROID) 50 MCG tablet, TAKE 1 TABLET DAILY ON AN EMPTY STOMACH WITH ONLY WATER FOR 30 MINUTES & NO ANTACID MEDS, CALCIUM OR MAGNESIUM FOR 4 HOURS & AVOID BIOTIN   testosterone cypionate (DEPOTESTOSTERONE CYPIONATE) 200 MG/ML injection, Inject 1 ml  into  Muscle  every  week  Current Outpatient Medications (Cardiovascular):    atenolol (TENORMIN) 100 MG tablet, TAKE 1 TABLET BY MOUTH  EVERY DAY FOR BLOOD PRESSURE   lisinopril (ZESTRIL) 20 MG tablet, TAKE 1 TABLET DAILY FOR BLOOD PRESSURE   tadalafil (CIALIS) 20 MG tablet, Take  1/2 to 1 tablet  every 2 to 3 days  as needed for  XXXX   Current Outpatient Medications (Analgesics):    allopurinol (ZYLOPRIM) 300 MG tablet, TAKE 1/2 TABLET DAILY TO PREVENT GOUT   meloxicam (MOBIC) 15 MG tablet, TAKE 1/2 TO 1 TABLET DAILY WITH FOOD AS NEEDED FOR PAIN & INFLAMMATION   Current Outpatient Medications (Other):    Cholecalciferol (VITAMIN D) 2000 UNITS tablet, Take 2,000 Units by mouth 4 (four) times daily.    clonazePAM (KLONOPIN) 1 MG tablet, Take 1 mg by mouth 2 (two) times daily as needed. Takes up to 2 tabs daily prn   cyclobenzaprine (FLEXERIL) 10 MG tablet, TAKE 1/2 TO 1 TABLET 2 TO 3 X DAY ONLY IF NEEDED FOR MUSCLE SPASM   diclofenac Sodium (VOLTAREN) 1 % GEL, APPLY 2 TO 4 GRAMS TOPICALLY TO PAINFUL JOINTS 2 TO 4 X /DAY   gabapentin (NEURONTIN) 800 MG tablet, Take  6 tablets  Daily  for Chronic Pain   methylphenidate (METADATE CD) 20 MG CR capsule,    mirtazapine (REMERON) 45 MG tablet, Take 45 mg by mouth at bedtime.   NEEDLE, DISP, 21 G 21G X 1" MISC, Use for testosterone injections, 3 ml   Omega-3 Fatty Acids (FISH OIL) 1000 MG CAPS, Take 1,000  mg by mouth.    SYRINGE-NEEDLE, DISP, 3 ML (B-D 3CC LUER-LOK SYR 23GX1") 23G X 1" 3 ML MISC, Inject Depo-  Testosterone into muscle every week   risperiDONE (RISPERDAL) 2 MG tablet, Take 2 mg by mouth at bedtime. (Patient not taking: Reported on 09/15/2021)   sertraline (ZOLOFT) 100 MG tablet, Take 100 mg by mouth daily. (Patient not taking: Reported on 09/15/2021)  Allergies: Allergies  Allergen Reactions   Serzone [Nefazodone]     Leg cramps   Trazamine [Trazodone & Diet Manage Prod] Other (See Comments)    confusion    Current Problems (verified) has Essential hypertension; Hereditary spherocytosis ([place]); ADD (attention deficit disorder); Abnormal glucose; Vitamin D  deficiency; Hyperlipidemia, mixed; Testosterone Deficiency; Medication management; Gout; Bipolar disorder with depression ([place]); BMI 25.0-25.9,adult; Hypothyroidism; and Aortic atherosclerosis ([place]) by T-Spine Xray on 08/26/2020 on their problem list.  Screening Tests Immunization History  Administered Date(s) Administered   Influenza Inj Mdck Quad With Preservative 12/06/2017   Influenza, High Dose Seasonal PF 12/17/2021   Influenza, Seasonal, Injecte, Preservative Fre 12/08/2016   Influenza,inj,Quad PF,6+ Mos 12/08/2013   Influenza-Unspecified 12/06/2015, 12/07/2016, 10/22/2018, 12/05/2019   Janssen (J&J) SARS-COV-2 Vaccination 05/16/2019   PFIZER(Purple Top)SARS-COV-2 Vaccination 12/17/2021   PNEUMOCOCCAL CONJUGATE-20 06/14/2021   PPD Test 07/23/2013, 07/24/2014, 08/09/2015, 08/30/2016, 10/23/2017, 11/26/2018, 12/25/2019, 02/22/2021   Pneumococcal-Unspecified 03/06/2009   Respiratory Syncytial Virus Vaccine,Recomb Aduvanted(Arexvy) 12/17/2021   Td 03/24/2012   Health Maintenance  Topic Date Due   Medicare Annual Wellness (AWV)  Never done   Zoster Vaccines- Shingrix (1 of 2) Never done   COLONOSCOPY (Pts 45-45yr Insurance coverage will need to be confirmed)  06/15/2022 (Originally 08/18/2019)   COVID-19 Vaccine (3 - Janssen risk series) 02/11/2022   TETANUS/TDAP  03/24/2022   Pneumonia Vaccine 66 Years old  Completed   INFLUENZA VACCINE  Completed   Hepatitis C Screening  Completed   HIV Screening  Completed   HPV VACCINES  Aged Out    Surgical: He  has a past surgical history that includes Fracture surgery; orthopedic surgery (Left); orthopedic surgery (Left); orthopedic surgery (Right); and orthopedic surgery (Right, 2002 dr supple). Family His family history includes Aortic stenosis in his father; Cancer in his mother; Hypertension in his father. Social history  He reports that he has never smoked. He has never used smokeless tobacco. He reports that he does not drink  alcohol. No history on file for drug use.   Objective:   Today's Vitals   12/27/21 0917  BP: 104/76  Pulse: (!) 43  Temp: (!) 97.1 F (36.2 C)  SpO2: 99%  Weight: 155 lb (70.3 kg)  Height: _0  (1.727 m)    Body mass index is 23.57 kg/m.  General appearance: alert, no distress, WD/WN, male HEENT: normocephalic, sclerae anicteric, TMs pearly, nares patent, no discharge or erythema, pharynx normal Oral cavity: MMM, no lesions Neck: supple, no lymphadenopathy, no thyromegaly, no masses Heart: RRR, normal S1, S2, no murmurs Lungs: CTA bilaterally, no wheezes, rhonchi, or rales Abdomen: +bs, soft, non tender, non distended, no masses, no hepatomegaly, no splenomegaly Musculoskeletal: nontender, no swelling, scoliosis thoracic, unable to reproduce pain Extremities: no edema, no cyanosis, no clubbing Pulses: 2+ symmetric, upper and lower extremities, normal cap refill Neurological: alert, oriented x 3, CN2-12 intact, strength normal upper extremities and lower extremities, sensation normal throughout, DTRs 2+ throughout, no cerebellar signs, gait normal Psychiatric: normal affect, behavior normal, pleasant    Mohmed Farver, NP   12/27/2021

## 2021-12-27 NOTE — Patient Instructions (Signed)
Due to recent weight loss we talked about modifying your blood pressure medications:  Atenolol and Lisinopril.    If your heart rate continues to read below 60 we may start to taper the Atenolol.    Healthy Eating Following a healthy eating pattern may help you to achieve and maintain a healthy body weight, reduce the risk of chronic disease, and live a long and productive life. It is important to follow a healthy eating pattern at an appropriate calorie level for your body. Your nutritional needs should be met primarily through food by choosing a variety of nutrient-rich foods. What are tips for following this plan? Reading food labels Read labels and choose the following: Reduced or low sodium. Juices with 100% fruit juice. Foods with low saturated fats and high polyunsaturated and monounsaturated fats. Foods with whole grains, such as whole wheat, cracked wheat, brown rice, and wild rice. Whole grains that are fortified with folic acid. This is recommended for women who are pregnant or who want to become pregnant. Read labels and avoid the following: Foods with a lot of added sugars. These include foods that contain brown sugar, corn sweetener, corn syrup, dextrose, fructose, glucose, high-fructose corn syrup, honey, invert sugar, lactose, malt syrup, maltose, molasses, raw sugar, sucrose, trehalose, or turbinado sugar. Do not eat more than the following amounts of added sugar per day: 6 teaspoons (25 g) for women. 9 teaspoons (38 g) for men. Foods that contain processed or refined starches and grains. Refined grain products, such as white flour, degermed cornmeal, white bread, and white rice. Shopping Choose nutrient-rich snacks, such as vegetables, whole fruits, and nuts. Avoid high-calorie and high-sugar snacks, such as potato chips, fruit snacks, and candy. Use oil-based dressings and spreads on foods instead of solid fats such as butter, stick margarine, or cream cheese. Limit  pre-made sauces, mixes, and "instant" products such as flavored rice, instant noodles, and ready-made pasta. Try more plant-protein sources, such as tofu, tempeh, black beans, edamame, lentils, nuts, and seeds. Explore eating plans such as the Mediterranean diet or vegetarian diet. Cooking Use oil to saut or stir-fry foods instead of solid fats such as butter, stick margarine, or lard. Try baking, boiling, grilling, or broiling instead of frying. Remove the fatty part of meats before cooking. Steam vegetables in water or broth. Meal planning  At meals, imagine dividing your plate into fourths: One-half of your plate is fruits and vegetables. One-fourth of your plate is whole grains. One-fourth of your plate is protein, especially lean meats, poultry, eggs, tofu, beans, or nuts. Include low-fat dairy as part of your daily diet. Lifestyle Choose healthy options in all settings, including home, work, school, restaurants, or stores. Prepare your food safely: Wash your hands after handling raw meats. Keep food preparation surfaces clean by regularly washing with hot, soapy water. Keep raw meats separate from ready-to-eat foods, such as fruits and vegetables. Cook seafood, meat, poultry, and eggs to the recommended internal temperature. Store foods at safe temperatures. In general: Keep cold foods at 10F (4.4C) or below. Keep hot foods at 110F (60C) or above. Keep your freezer at [facility] (-17.8C) or below. Foods are no longer safe to eat when they have been between the temperatures of 40-110F (4.4-60C) for more than 2 hours. What foods should I eat? Fruits Aim to eat 2 cup-equivalents of fresh, canned (in natural juice), or frozen fruits each day. Examples of 1 cup-equivalent of fruit include 1 small apple, 8 large strawberries, 1 cup canned fruit,  cup dried fruit, or 1 cup 100% juice. Vegetables Aim to eat 2-3 cup-equivalents of fresh and frozen vegetables each day, including  different varieties and colors. Examples of 1 cup-equivalent of vegetables include 2 medium carrots, 2 cups raw, leafy greens, 1 cup chopped vegetable (raw or cooked), or 1 medium baked potato. Grains Aim to eat 6 ounce-equivalents of whole grains each day. Examples of 1 ounce-equivalent of grains include 1 slice of bread, 1 cup ready-to-eat cereal, 3 cups popcorn, or  cup cooked rice, pasta, or cereal. Meats and other proteins Aim to eat 5-6 ounce-equivalents of protein each day. Examples of 1 ounce-equivalent of protein include 1 egg, 1/2 cup nuts or seeds, or 1 tablespoon (16 g) peanut butter. A cut of meat or fish that is the size of a deck of cards is about 3-4 ounce-equivalents. Of the protein you eat each week, try to have at least 8 ounces come from seafood. This includes salmon, trout, herring, and anchovies. Dairy Aim to eat 3 cup-equivalents of fat-free or low-fat dairy each day. Examples of 1 cup-equivalent of dairy include 1 cup (240 mL) milk, 8 ounces (250 g) yogurt, 1 ounces (44 g) natural cheese, or 1 cup (240 mL) fortified soy milk. Fats and oils Aim for about 5 teaspoons (21 g) per day. Choose monounsaturated fats, such as canola and olive oils, avocados, peanut butter, and most nuts, or polyunsaturated fats, such as sunflower, corn, and soybean oils, walnuts, pine nuts, sesame seeds, sunflower seeds, and flaxseed. Beverages Aim for six 8-oz glasses of water per day. Limit coffee to three to five 8-oz cups per day. Limit caffeinated beverages that have added calories, such as soda and energy drinks. Limit alcohol intake to no more than 1 drink a day for nonpregnant women and 2 drinks a day for men. One drink equals 12 oz of beer (355 mL), 5 oz of wine (148 mL), or 1 oz of hard liquor (44 mL). Seasoning and other foods Avoid adding excess amounts of salt to your foods. Try flavoring foods with herbs and spices instead of salt. Avoid adding sugar to foods. Try using oil-based  dressings, sauces, and spreads instead of solid fats. This information is based on general [place] nutrition guidelines. For more information, visit [employer]es. Exact amounts may vary based on your nutrition needs. Summary A healthy eating plan may help you to maintain a healthy weight, reduce the risk of chronic diseases, and stay active throughout your life. Plan your meals. Make sure you eat the right portions of a variety of nutrient-rich foods. Try baking, boiling, grilling, or broiling instead of frying. Choose healthy options in all settings, including home, work, school, restaurants, or stores. This information is not intended to replace advice given to you by your health care provider. Make sure you discuss any questions you have with your health care provider. Document Revised: 10/19/2020 Document Reviewed: 10/19/2020 Elsevier Patient Education  [place].

## 2021-12-28 LAB — CBC WITH DIFFERENTIAL/PLATELET
Absolute Monocytes: 464 cells/uL (ref 200–950)
Basophils Absolute: 64 cells/uL (ref 0–200)
Basophils Relative: 0.7 %
Eosinophils Absolute: 127 cells/uL (ref 15–500)
Eosinophils Relative: 1.4 %
HCT: 45.5 % (ref 38.5–50.0)
Hemoglobin: 14.2 g/dL (ref 13.2–17.1)
Lymphs Abs: 3385 cells/uL (ref 850–3900)
MCH: 20.4 pg — ABNORMAL LOW (ref 27.0–33.0)
MCHC: 31.2 g/dL — ABNORMAL LOW (ref 32.0–36.0)
MCV: 65.5 fL — ABNORMAL LOW (ref 80.0–100.0)
Monocytes Relative: 5.1 %
Neutro Abs: 5060 cells/uL (ref 1500–7800)
Neutrophils Relative %: 55.6 %
Platelets: 281 10*3/uL (ref 140–400)
RBC: 6.95 10*6/uL — ABNORMAL HIGH (ref 4.20–5.80)
RDW: 19.6 % — ABNORMAL HIGH (ref 11.0–15.0)
Total Lymphocyte: 37.2 %
WBC: 9.1 10*3/uL (ref 3.8–10.8)

## 2021-12-28 LAB — COMPLETE METABOLIC PANEL WITH GFR
AG Ratio: 2.3 (calc) (ref 1.0–2.5)
ALT: 29 U/L (ref 9–46)
AST: 38 U/L — ABNORMAL HIGH (ref 10–35)
Albumin: 4.8 g/dL (ref 3.6–5.1)
Alkaline phosphatase (APISO): 75 U/L (ref 35–144)
BUN: 24 mg/dL (ref 7–25)
CO2: 30 mmol/L (ref 20–32)
Calcium: 10.4 mg/dL — ABNORMAL HIGH (ref 8.6–10.3)
Chloride: 98 mmol/L (ref 98–110)
Creat: 1.17 mg/dL (ref 0.70–1.35)
Globulin: 2.1 g/dL (calc) (ref 1.9–3.7)
Glucose, Bld: 61 mg/dL — ABNORMAL LOW (ref 65–99)
Potassium: 4.8 mmol/L (ref 3.5–5.3)
Sodium: 138 mmol/L (ref 135–146)
Total Bilirubin: 1.6 mg/dL — ABNORMAL HIGH (ref 0.2–1.2)
Total Protein: 6.9 g/dL (ref 6.1–8.1)
eGFR: 69 mL/min/{1.73_m2} (ref >=60)

## 2021-12-28 LAB — LIPID PANEL
Cholesterol: 151 mg/dL (ref <200)
HDL: 32 mg/dL — ABNORMAL LOW (ref >=40)
LDL Cholesterol (Calc): 85 mg/dL (calc)
Non-HDL Cholesterol (Calc): 119 mg/dL (calc) (ref <130)
Total CHOL/HDL Ratio: 4.7 (calc) (ref <5.0)
Triglycerides: 255 mg/dL — ABNORMAL HIGH (ref <150)

## 2022-01-02 ENCOUNTER — Encounter: Payer: Medicare PPO | Admitting: Internal Medicine

## 2022-02-09 DIAGNOSIS — G4726 Circadian rhythm sleep disorder, shift work type: Secondary | ICD-10-CM | POA: Diagnosis not present

## 2022-02-09 DIAGNOSIS — F3341 Major depressive disorder, recurrent, in partial remission: Secondary | ICD-10-CM | POA: Diagnosis not present

## 2022-02-09 DIAGNOSIS — F419 Anxiety disorder, unspecified: Secondary | ICD-10-CM | POA: Diagnosis not present

## 2022-03-02 ENCOUNTER — Encounter: Payer: Medicare PPO | Admitting: Internal Medicine

## 2022-03-14 ENCOUNTER — Encounter: Payer: Self-pay | Admitting: Nurse Practitioner

## 2022-03-14 MED ORDER — "NEEDLE (DISP) 21G X 1"" MISC": 1 refills | Status: DC

## 2022-03-16 MED ORDER — "NEEDLE (DISP) 21G X 1"" MISC": 1 refills | Status: DC

## 2022-04-05 IMAGING — CR DG THORACIC SPINE 3V
2 series · 2 of 2 positions shown · non-contrast
Comparison: None.

CLINICAL DATA: Pain

EXAM:
THORACIC SPINE - 3 VIEWS; LUMBAR SPINE - COMPLETE 4+ VIEW

[w thoracic spine lat]
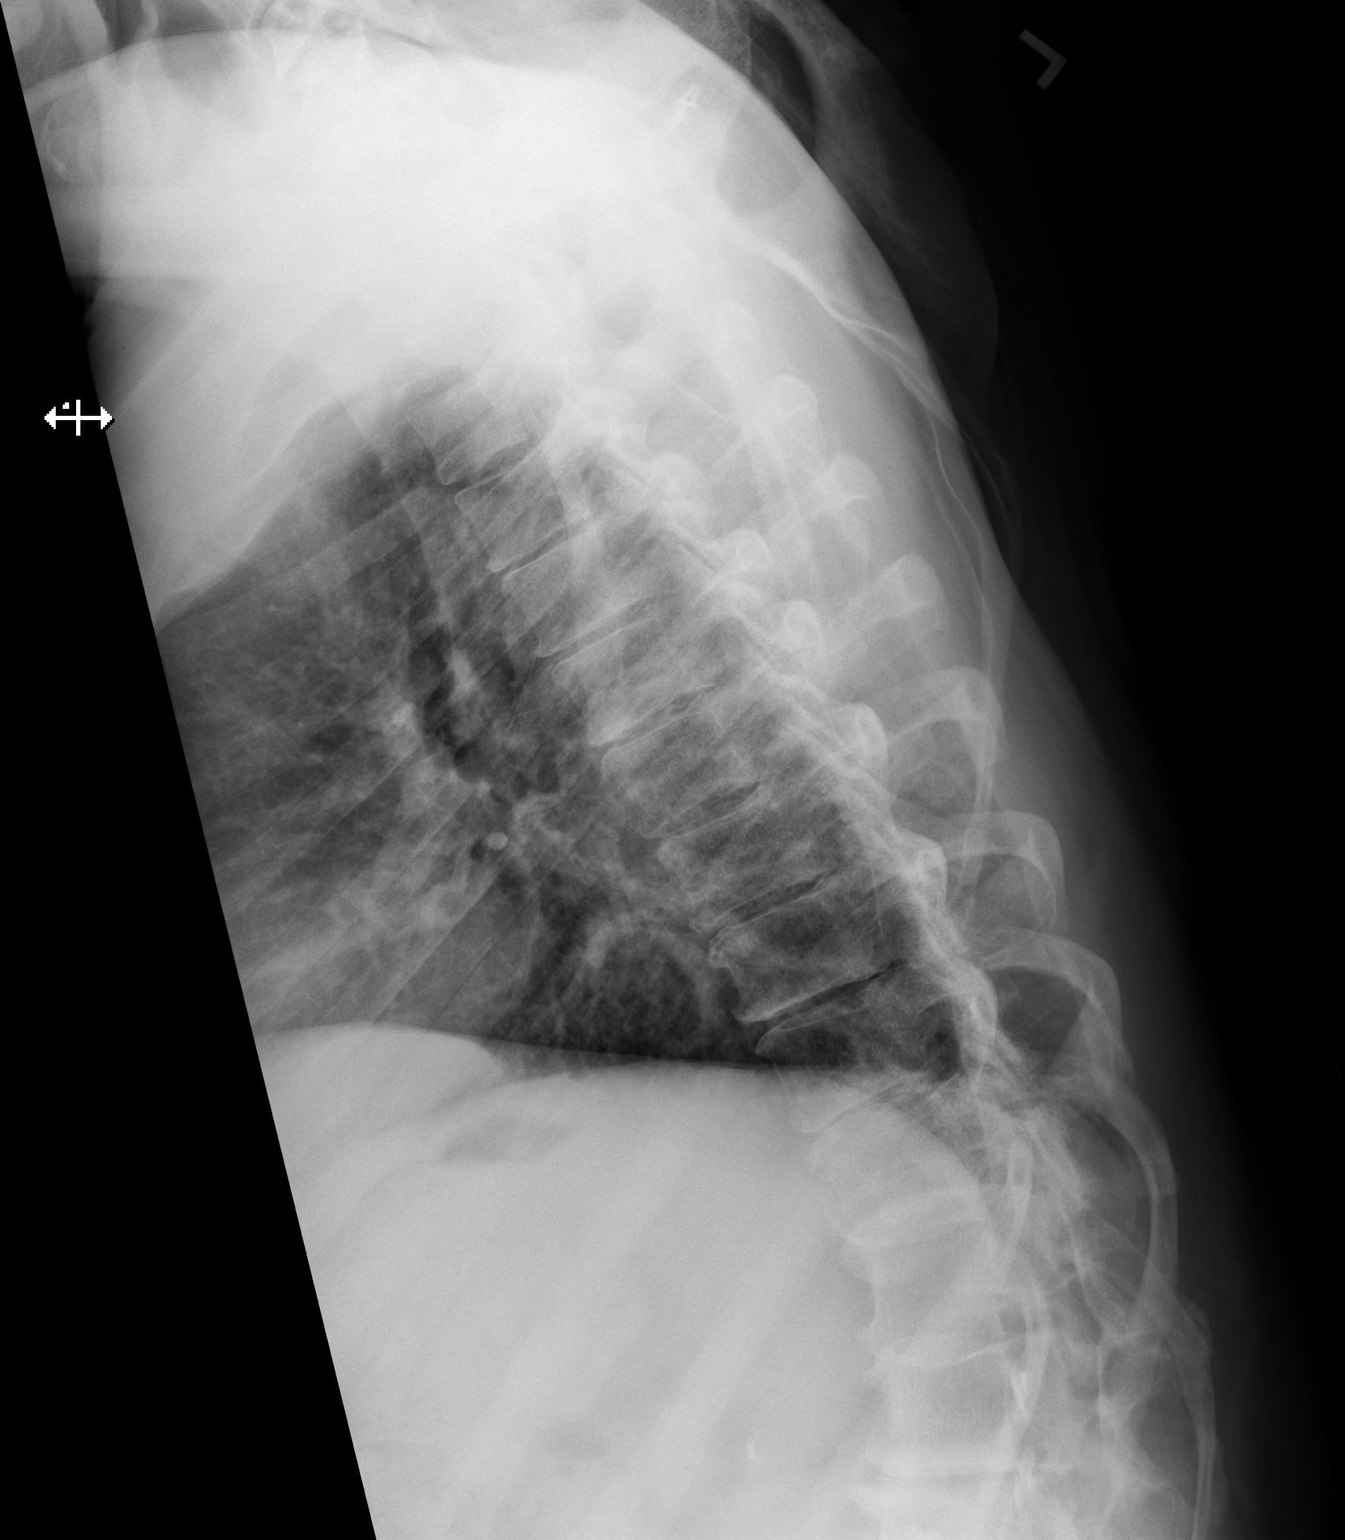

[w thoracic swimmers]
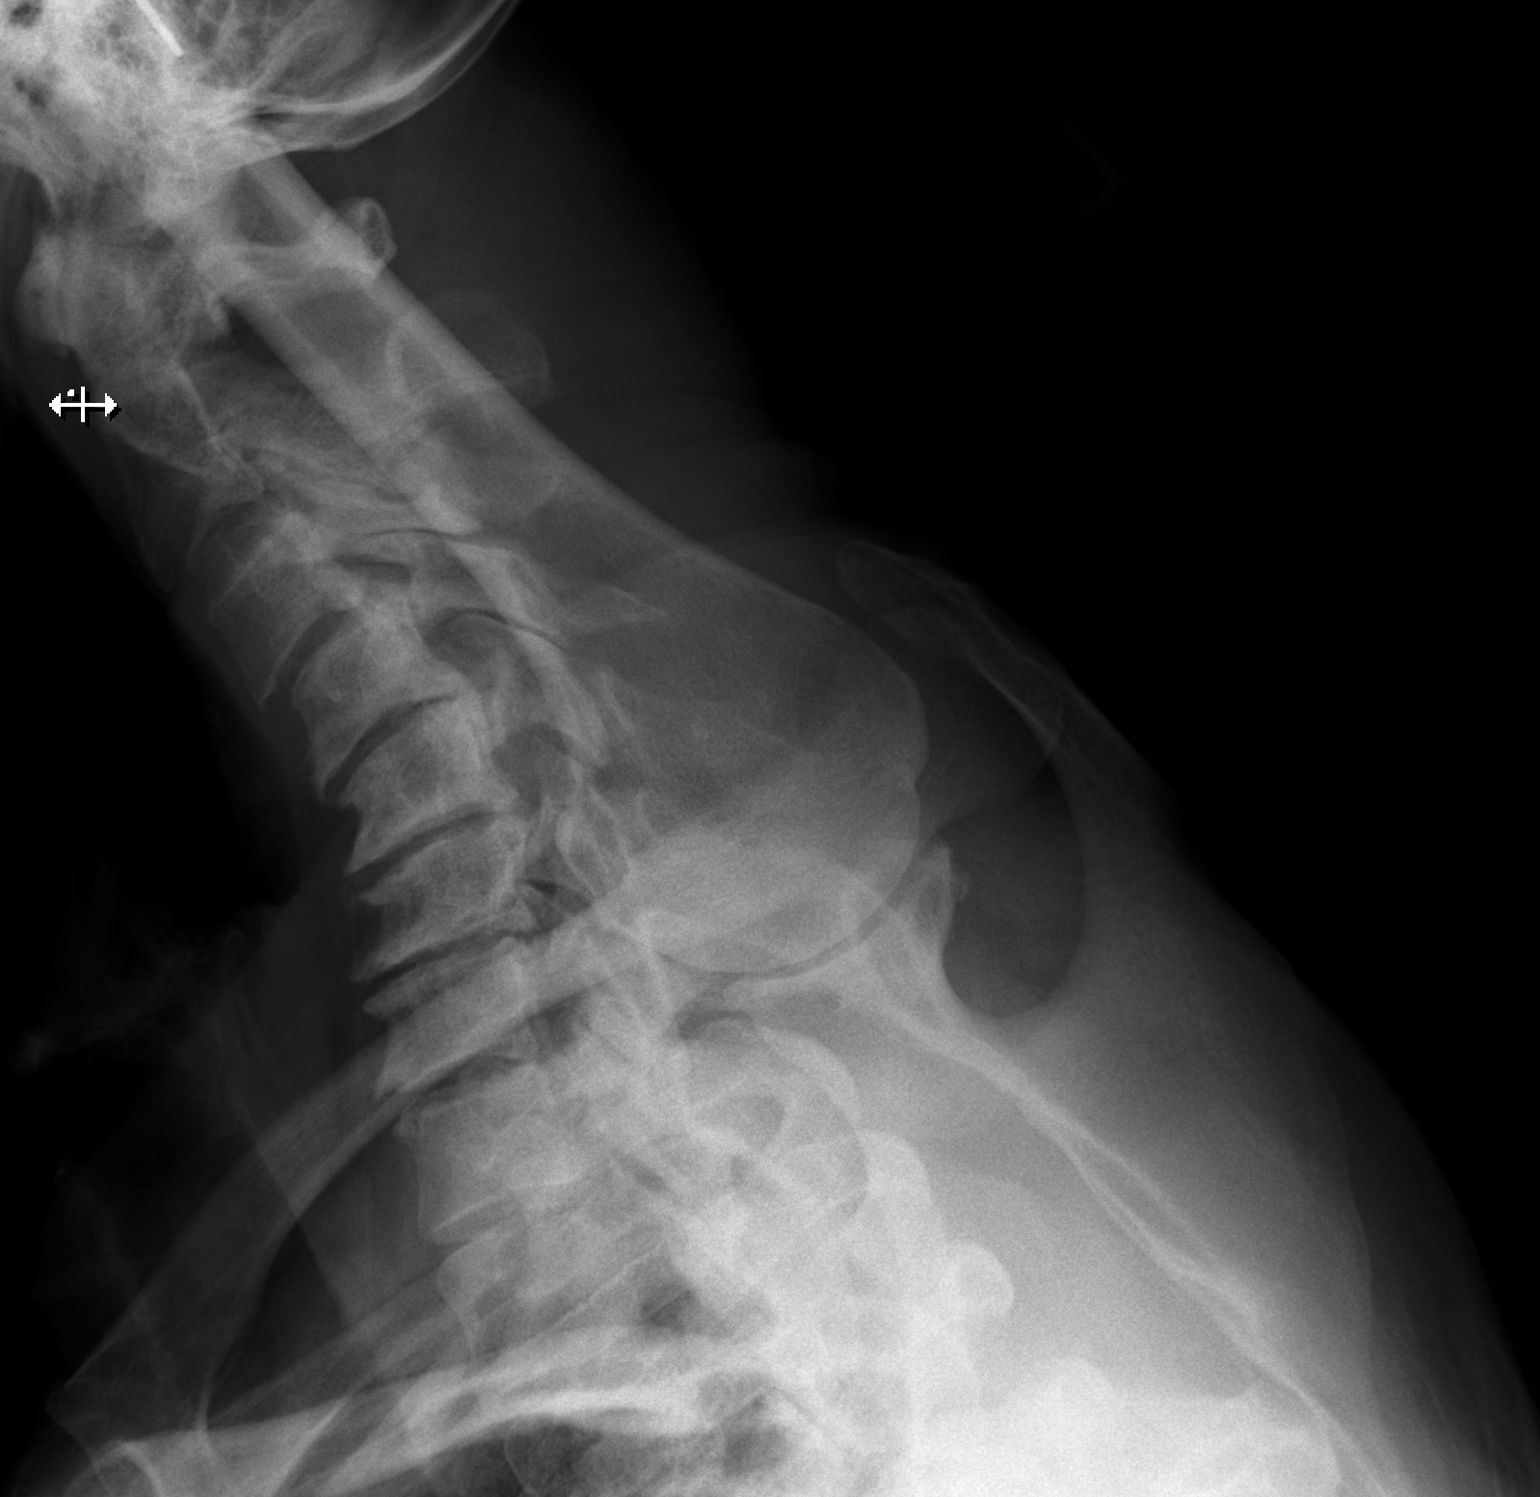

[2 of 2 positions shown; findings below may reference images not displayed]

FINDINGS: There are five non-rib bearing lumbar-type vertebral bodies. There
is thoracolumbar scoliosis with dextrocurvature of the inferior
thoracic spine and marked levocurvature of the lumbar spine. There
is a rotary component. There is a lateral listhesis component of the
lumbar spine with lateral listhesis noted at T12-L1 and L3-4. There
is reversal of the cervical lordosis centered at C4-5.

There is mild age indeterminate wedging at the thoracolumbar
junction, likely chronic. Multilevel intervertebral disc space
height loss which is severe at the thoracolumbar junction and
superior lumbar spine. Facet arthropathy. Multilevel endplate
proliferative changes.

Radiopaque density projecting over the contour of the LEFT kidney
measures 5 mm and likely reflects a nephrolithiasis. Atherosclerotic
calcifications of the aorta.
IMPRESSION: 1. Thoracolumbar scoliosis with resultant lateral listhesis of the
lumbar spine and degenerative changes most pronounced at the
superior lumbar spine inferior thoracic spine.
2. Mild age-indeterminate wedging at the thoracolumbar junction,
favored chronic. Recommend correlation with point tenderness.
3. 5 mm radiopaque density projecting over the contour LEFT kidney
likely reflecting a nephrolithiasis.

Aortic Atherosclerosis (TEUOX-GRF.F).

## 2022-04-05 IMAGING — CR DG LUMBAR SPINE COMPLETE 4+V
5 series · 5 of 5 positions shown · non-contrast
Comparison: None.

CLINICAL DATA: Pain

EXAM:
THORACIC SPINE - 3 VIEWS; LUMBAR SPINE - COMPLETE 4+ VIEW

[w lumbar spine ap]
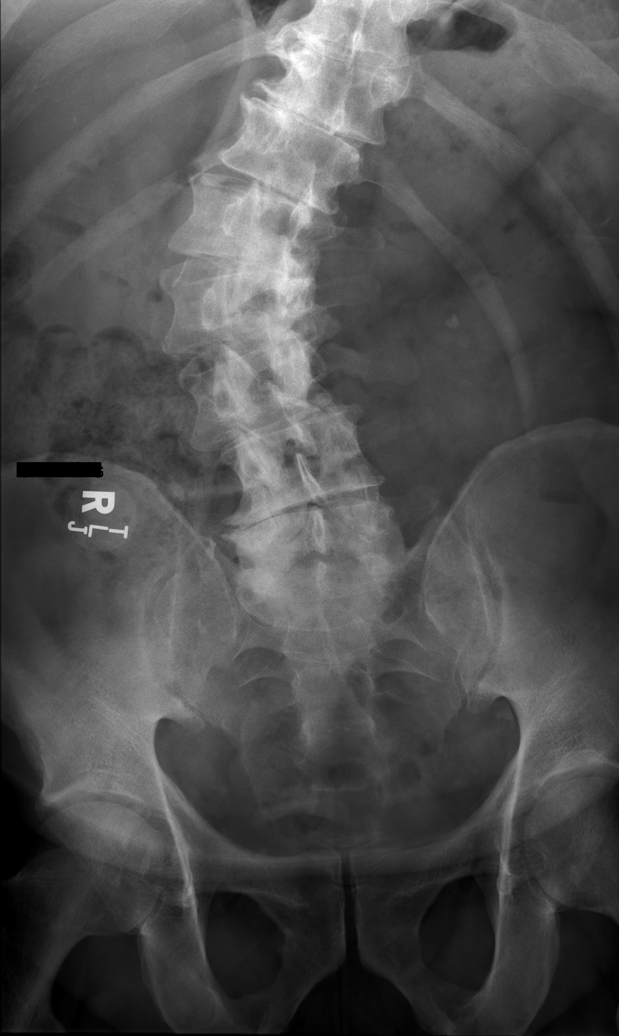

[w lumbar spine obl (1 of 2)]
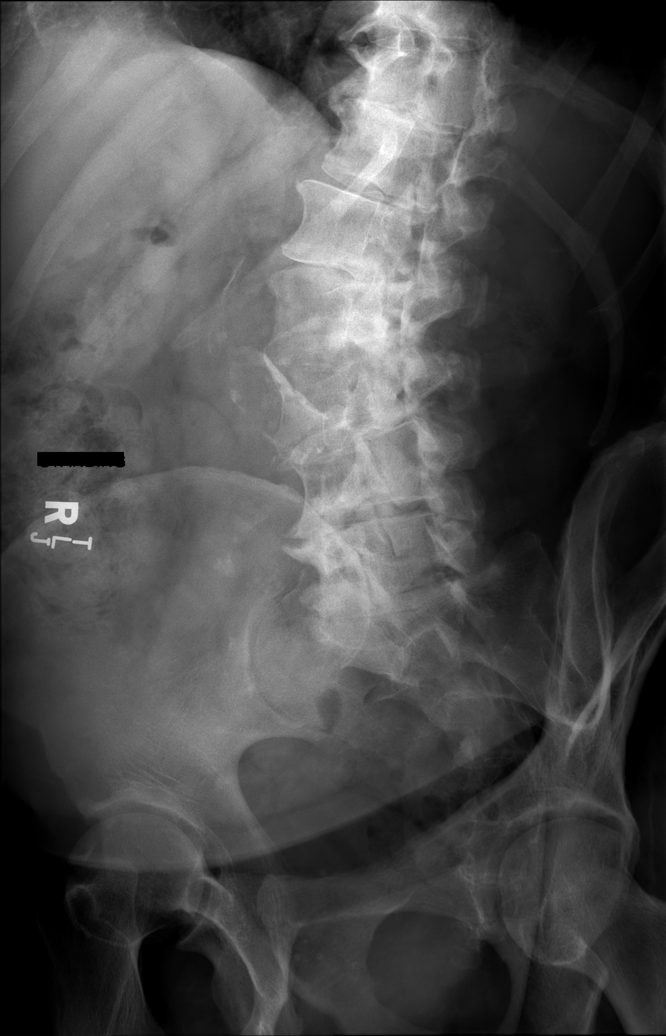

[w lumbar spine obl (2 of 2)]
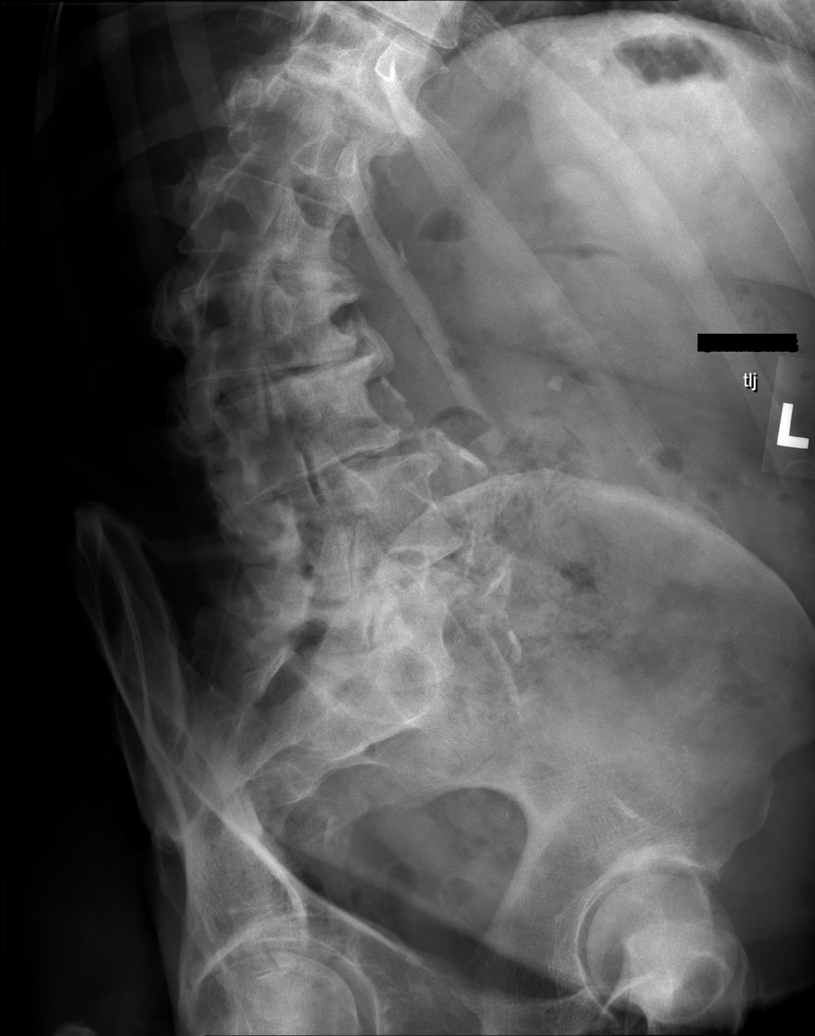

[w lumbar spine lat]
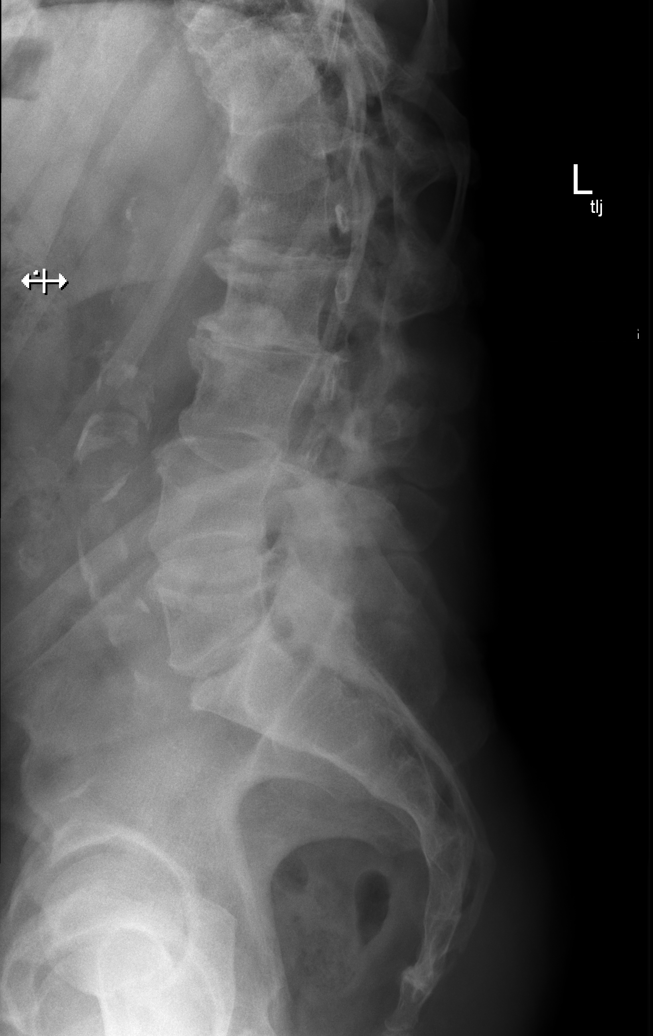

[w lumbar l-5 s-1 spot]
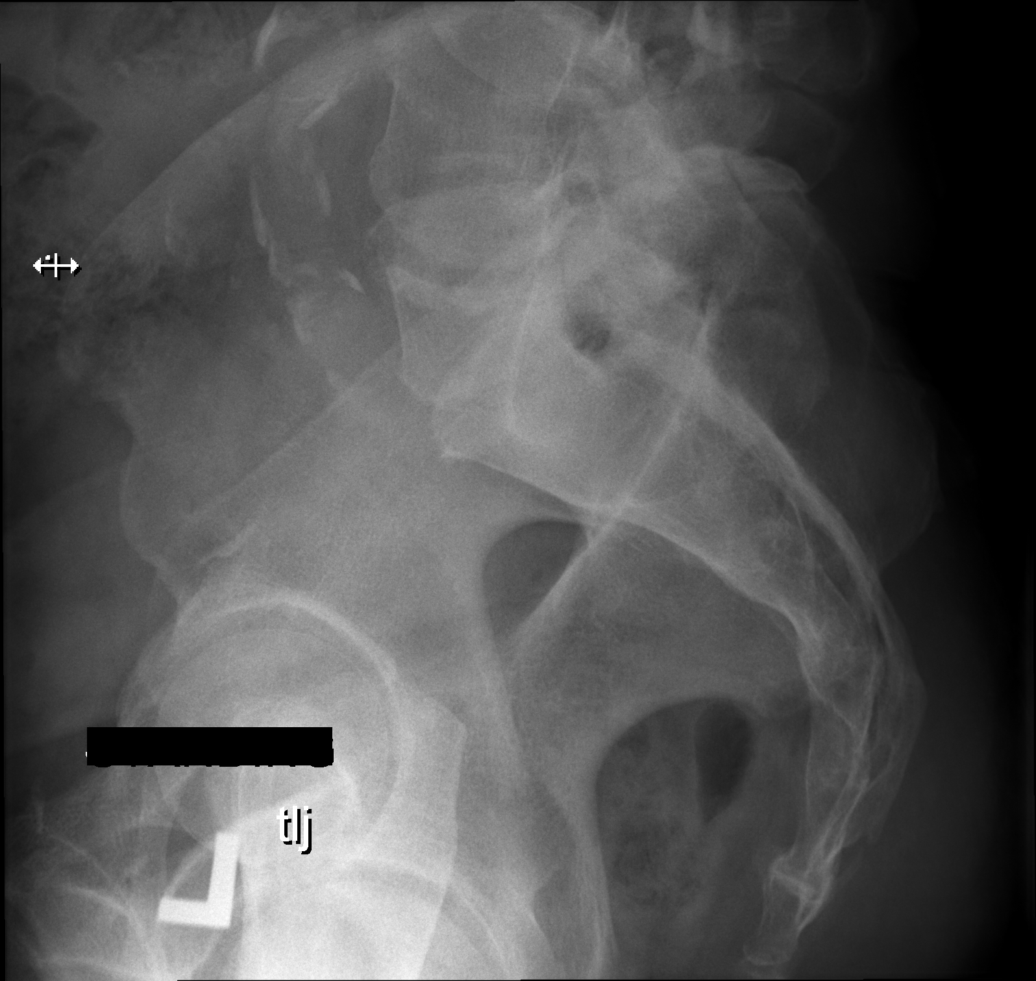

[5 of 5 positions shown; findings below may reference images not displayed]

FINDINGS: There are five non-rib bearing lumbar-type vertebral bodies. There
is thoracolumbar scoliosis with dextrocurvature of the inferior
thoracic spine and marked levocurvature of the lumbar spine. There
is a rotary component. There is a lateral listhesis component of the
lumbar spine with lateral listhesis noted at T12-L1 and L3-4. There
is reversal of the cervical lordosis centered at C4-5.

There is mild age indeterminate wedging at the thoracolumbar
junction, likely chronic. Multilevel intervertebral disc space
height loss which is severe at the thoracolumbar junction and
superior lumbar spine. Facet arthropathy. Multilevel endplate
proliferative changes.

Radiopaque density projecting over the contour of the LEFT kidney
measures 5 mm and likely reflects a nephrolithiasis. Atherosclerotic
calcifications of the aorta.
IMPRESSION: 1. Thoracolumbar scoliosis with resultant lateral listhesis of the
lumbar spine and degenerative changes most pronounced at the
superior lumbar spine inferior thoracic spine.
2. Mild age-indeterminate wedging at the thoracolumbar junction,
favored chronic. Recommend correlation with point tenderness.
3. 5 mm radiopaque density projecting over the contour LEFT kidney
likely reflecting a nephrolithiasis.

Aortic Atherosclerosis (TEUOX-GRF.F).

## 2022-04-14 DIAGNOSIS — L821 Other seborrheic keratosis: Secondary | ICD-10-CM | POA: Diagnosis not present

## 2022-04-14 DIAGNOSIS — D485 Neoplasm of uncertain behavior of skin: Secondary | ICD-10-CM | POA: Diagnosis not present

## 2022-04-14 DIAGNOSIS — L570 Actinic keratosis: Secondary | ICD-10-CM | POA: Diagnosis not present

## 2022-04-14 DIAGNOSIS — D225 Melanocytic nevi of trunk: Secondary | ICD-10-CM | POA: Diagnosis not present

## 2022-04-14 DIAGNOSIS — L814 Other melanin hyperpigmentation: Secondary | ICD-10-CM | POA: Diagnosis not present

## 2022-04-14 DIAGNOSIS — D224 Melanocytic nevi of scalp and neck: Secondary | ICD-10-CM | POA: Diagnosis not present

## 2022-04-17 ENCOUNTER — Ambulatory Visit: Payer: Medicare PPO | Admitting: Internal Medicine

## 2022-04-17 ENCOUNTER — Encounter: Payer: Self-pay | Admitting: Internal Medicine

## 2022-04-17 VITALS — BP 120/64 | HR 46 | Temp 97.5°F | Resp 16 | Ht 67.0 in | Wt 169.2 lb

## 2022-04-17 DIAGNOSIS — E291 Testicular hypofunction: Secondary | ICD-10-CM

## 2022-04-17 DIAGNOSIS — I700 Atherosclerosis of aorta: Secondary | ICD-10-CM | POA: Diagnosis not present

## 2022-04-17 DIAGNOSIS — R7309 Other abnormal glucose: Secondary | ICD-10-CM

## 2022-04-17 DIAGNOSIS — Z125 Encounter for screening for malignant neoplasm of prostate: Secondary | ICD-10-CM

## 2022-04-17 DIAGNOSIS — E039 Hypothyroidism, unspecified: Secondary | ICD-10-CM | POA: Diagnosis not present

## 2022-04-17 DIAGNOSIS — I10 Essential (primary) hypertension: Secondary | ICD-10-CM

## 2022-04-17 DIAGNOSIS — Z1211 Encounter for screening for malignant neoplasm of colon: Secondary | ICD-10-CM

## 2022-04-17 DIAGNOSIS — E782 Mixed hyperlipidemia: Secondary | ICD-10-CM | POA: Diagnosis not present

## 2022-04-17 DIAGNOSIS — Z79899 Other long term (current) drug therapy: Secondary | ICD-10-CM

## 2022-04-17 DIAGNOSIS — D580 Hereditary spherocytosis: Secondary | ICD-10-CM

## 2022-04-17 DIAGNOSIS — Z136 Encounter for screening for cardiovascular disorders: Secondary | ICD-10-CM | POA: Diagnosis not present

## 2022-04-17 DIAGNOSIS — F319 Bipolar disorder, unspecified: Secondary | ICD-10-CM

## 2022-04-17 DIAGNOSIS — N401 Enlarged prostate with lower urinary tract symptoms: Secondary | ICD-10-CM | POA: Diagnosis not present

## 2022-04-17 DIAGNOSIS — Z0000 Encounter for general adult medical examination without abnormal findings: Secondary | ICD-10-CM

## 2022-04-17 DIAGNOSIS — M1000 Idiopathic gout, unspecified site: Secondary | ICD-10-CM

## 2022-04-17 DIAGNOSIS — E559 Vitamin D deficiency, unspecified: Secondary | ICD-10-CM

## 2022-04-17 DIAGNOSIS — Z0001 Encounter for general adult medical examination with abnormal findings: Secondary | ICD-10-CM

## 2022-04-17 DIAGNOSIS — Z8249 Family history of ischemic heart disease and other diseases of the circulatory system: Secondary | ICD-10-CM

## 2022-04-17 DIAGNOSIS — N138 Other obstructive and reflux uropathy: Secondary | ICD-10-CM

## 2022-04-17 MED ORDER — OLMESARTAN MEDOXOMIL 40 MG PO TABS: ORAL_TABLET | ORAL | 3 refills | Status: DC

## 2022-04-17 NOTE — Patient Instructions (Signed)

## 2022-04-17 NOTE — Progress Notes (Signed)
\ Annual  Screening/Preventative Visit  & Comprehensive Evaluation & Examination   Future Appointments  Date Time Provider Department  04/17/2022 10:00 AM Unk Pinto, MD [facility]  04/24/2023 10:00 AM Unk Pinto, MD [facility]          This very nice 67 y.o. [name]  presents for a Screening /Preventative Visit & comprehensive evaluation and management of multiple medical co-morbidities.  Patient has been followed for HTN, HLD, Prediabetes and Vitamin D Deficiency. Patient's Gout is controlled on his meds.  Thoracic spine Xray showed Aortic Atherosclerosis.   Patient haas hx/o Hereditary Spherocytosis w/o anemia. 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        HTN predates circa  1998. Patient's BP has been controlled at home.  Today's BP is at goal - 120/64 , but heart rate is down in the 40's today. Patient denies any postural or orthostatic sx's. Patient denies any cardiac symptoms as chest pain, palpitations, shortness of breath or ankle swelling.        Patient's hyperlipidemia is controlled with diet .  Last lipids were at goal except Trig's :  Lab Results  Component Value Date   CHOL 151 12/27/2021   HDL 32 (L) 12/27/2021   LDLCALC 85 12/27/2021   TRIG 255 (H) 12/27/2021   CHOLHDL 4.7 12/27/2021         Patient has hx/o prediabetes (A1c 5.8% /2010) and patient denies reactive hypoglycemic symptoms, visual blurring, diabetic polys or paresthesias. Last A1c was normal & at goal :   Lab Results  Component Value Date   HGBA1C 5.1 09/15/2021                           In Dec 2020, patient was  dx'd Hypothyroid and started on Thyroid Replacement in Feb 2021.                                           Patient has been on parenteral  Testosterone replacement for hx/o Testosterone Deficiency  with improved Stamina  & sense of well being.                                               Finally, patient has history of Vitamin D  Deficiency ("31" /2008 ) and last vitamin D was at goal :   Lab Results  Component Value Date   VD25OH 52 09/15/2021        Current Outpatient Medications on File Prior to Visit  Medication Sig   allopurinol 300 MG tablet Take  1/2 tablet  Daily     atenolol 100 MG tablet TAKE 1 TABLET DAILY    VITAMIN D 2000 u Take 4  times daily. (8,000 u)    clonazePAM 1 MG tablet Take 1 mg 2  times daily as needed. Takes up to 2 tabs daily prn   cyclobenzaprine  10 MG tablet Take 1/2 - 1 tablet 2-3 x /day as needed    gabapentin 800 MG tablet TAKE 6 TABLETS DAILY AS DIRECTED    levothyroxine 50 MCG tablet Take  1 tablet  Daily   lisinopril 20 MG tablet Take  1  tablet  Daily for BP   meloxicam (15 MG tablet TAKE 1/2 TO 1 TABLET DAILY    methylphenidate  CD 20 MG CR     mirtazapine  45 MG tablet Take 45 mg at bedtime.   Omega-3 FISH OIL 1000 MG  Take daily   risperiDONE 2 MG tablet Take at bedtime.   sertraline  100 MG tablet Take  daily.   tadalafil \20 MG tablet Take  1/2 to 1 tablet  every 2 to 3 days  as needed for  Kevin Gomez   testosterone cypio 200 mg/ml  injec INJECT 2 ML IM EVERY 2 WEEKS     Allergies  Allergen Reactions   Serzone [Nefazodone] Leg cramps   Trazamine [Trazodone]  confusion       Past Medical History:  Diagnosis Date   ADD (attention deficit disorder)    Anemia    Depression    Elevated hemoglobin A1c    Hyperlipidemia    Hypertension    Vitamin D deficiency      Health Maintenance  Topic Date Due   Pneumococcal Vaccine  [birth year]/17/1962   Zoster Vaccines- Shingrix (1 of 2) Never done   COVID-19 Vaccine (2 - Janssen risk series) 06/13/2019   COLONOSCOPY  08/18/2019   INFLUENZA VACCINE  10/04/2020   TETANUS/TDAP  03/24/2022   Hepatitis C Screening  Completed   HIV Screening  Completed   HPV VACCINES  Aged Out     Immunization History  Administered Date(s) Administered   Influenza Inj Mdck Quad  12/06/2017   Influenza 12/08/2016   Influenza,inj,Quad   12/08/2013   Influenza 12/07/2016, 10/22/2018, 12/05/2019   Janssen (J&J) SARS-COV-2 Vacc 05/16/2019   PPD Test 10/23/2017, 11/26/2018, 12/25/2019   Pneumococcal-23 03/06/2009   Td 03/24/2012    Last Colon -   08/23/2009 - Dr Earle Gell -Recc 10 yr f/u   Cologard  - 03/2021 - Negative  - Recc 3 year f/u due Jan 2026   Past Surgical History:  Procedure Laterality Date   FRACTURE SURGERY     ORTHOPEDIC SURGERY Left    left foot pinning   ORTHOPEDIC SURGERY Left    left foot pinning   ORTHOPEDIC SURGERY Right    right elbow bursectomy  1984   ORTHOPEDIC SURGERY Right 2002 dr supple   right rotator cuff     Family History  Problem Relation Age of Onset   Cancer Mother        melonoma   Hypertension Father    Aortic stenosis Father      Social History   Tobacco Use   Smoking status: Never   Smokeless tobacco: Never  Substance Use Topics   Alcohol use: No    Alcohol/week: 0.0 standard drinks      ROS Constitutional: Denies fever, chills, weight loss/gain, headaches, insomnia,  night sweats or change in appetite. Does c/o fatigue. Eyes: Denies redness, blurred vision, diplopia, discharge, itchy or watery eyes.  ENT: Denies discharge, congestion, post nasal drip, epistaxis, sore throat, earache, hearing loss, dental pain, Tinnitus, Vertigo, Sinus pain or snoring.  Cardio: Denies chest pain, palpitations, irregular heartbeat, syncope, dyspnea, diaphoresis, orthopnea, PND, claudication or edema Respiratory: denies cough, dyspnea, DOE, pleurisy, hoarseness, laryngitis or wheezing.  Gastrointestinal: Denies dysphagia, heartburn, reflux, water brash, pain, cramps, nausea, vomiting, bloating, diarrhea, constipation, hematemesis, melena, hematochezia, jaundice or hemorrhoids Genitourinary: Denies dysuria, frequency, urgency, nocturia, hesitancy, discharge, hematuria or flank pain Musculoskeletal: Denies arthralgia, myalgia, stiffness, Jt. Swelling, pain, limp or  strain/sprain. Denies  Falls. Skin: Denies puritis, rash, hives, warts, acne, eczema or change in skin lesion Neuro: No weakness, tremor, incoordination, spasms, paresthesia or pain Psychiatric: Denies confusion, memory loss or sensory loss. Denies Depression. Endocrine: Denies change in weight, skin, hair change, nocturia, and paresthesia, diabetic polys, visual blurring or hyper / hypo glycemic episodes.  Heme/Lymph: No excessive bleeding, bruising or enlarged lymph nodes.   Physical Exam  BP 120/64   Pulse (!) 46   Temp (!) 97.5 F (36.4 C)   Resp 16   Ht 5' 7"$  (1.702 m)   Wt 169 lb 3.2 oz (76.7 kg)   SpO2 99%   BMI 26.50 kg/m   General Appearance: Well nourished and well groomed and in no apparent distress.  Eyes: PERRLA, EOMs, conjunctiva no swelling or erythema, normal fundi and vessels. Sinuses: No frontal/maxillary tenderness ENT/Mouth: EACs patent / TMs  nl. Nares clear without erythema, swelling, mucoid exudates. Oral hygiene is good. No erythema, swelling, or exudate. Tongue normal, non-obstructing. Tonsils not swollen or erythematous. Hearing normal.  Neck: Supple, thyroid not palpable. No bruits, nodes or JVD. Respiratory: Respiratory effort normal.  BS equal and clear bilateral without rales, rhonci, wheezing or stridor. Cardio: Heart sounds are normal with regular rate and rhythm and no murmurs, rubs or gallops. Peripheral pulses are normal and equal bilaterally without edema. No aortic or femoral bruits. Chest: symmetric with normal excursions and percussion.  Abdomen: Soft, with Nl bowel sounds. Nontender, no guarding, rebound, hernias, masses, or organomegaly.  Lymphatics: Non tender without lymphadenopathy.  Musculoskeletal: Full ROM all peripheral extremities, joint stability, 5/5 strength, and normal gait. Skin: Warm and dry without rashes, lesions, cyanosis, clubbing or  ecchymosis.  Neuro: Cranial nerves intact, reflexes equal bilaterally. Normal muscle tone,  no cerebellar symptoms. Sensation intact.  Pysch: Alert and oriented X 3 with normal affect, insight and judgment appropriate.   Assessment and Plan  1. Annual Preventative/Screening Exam    2. Essential hypertension  - Advised taper Atenolol 100 mg to 1/2 tab (50 mg ) qam  & - Change Lisinopril  to Olmesartan 40 mg 1/2 to 1 tablet at Night    - EKG 12-Lead - [place], RETROPERITNL ABD,  LTD - Urinalysis, Routine w reflex microscopic - Microalbumin / creatinine urine ratio - CBC with Differential/Platelet - COMPLETE METABOLIC PANEL WITH GFR - Magnesium - TSH  3. Hyperlipidemia, mixed  - EKG 12-Lead - [place], RETROPERITNL ABD,  LTD - Lipid panel - TSH  4. Abnormal glucose  - EKG 12-Lead - [place], RETROPERITNL ABD,  LTD - Hemoglobin A1c - Insulin, random  5. Vitamin D deficiency  - VITAMIN D 25 Hydroxy   6. Aortic atherosclerosis ([place]) by T-Spine Xray on 08/26/2020  - EKG 12-Lead - [place], RETROPERITNL ABD,  LTD - Lipid panel  7. Hypothyroidism  - TSH  8. Testosterone Deficiency  - Testosterone  9. Idiopathic gout  - Uric acid  10. Hereditary spherocytosis ([facility])  - CBC with Differential/Platelet  11. 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 Prostate cancer screening  - PSA  16. Screening for colorectal cancer  - POC Hemoccult Bld/Stl   17. Screening for ischemic heart disease  - EKG 12-Lead  18. FHx: heart disease  - EKG 12-Lead - [place], RETROPERITNL ABD,  LTD  19. Screening for  AAA (aortic abdominal aneurysm)  - [place], RETROPERITNL ABD,  LTD  20. Fatigue, unspecified type  - Iron, Total/Total Iron Binding Cap - Vitamin B12 - Testosterone - CBC with Differential/Platelet - TSH  21. Medication management  - Urinalysis,  Routine w reflex microscopic - Microalbumin / creatinine urine ratio - Testosterone - Uric acid - CBC with Differential/Platelet - COMPLETE METABOLIC PANEL WITH GFR - Magnesium - Lipid panel - TSH - Hemoglobin A1c - Insulin, random - VITAMIN D 25 Hydroxy           Patient was counseled in prudent diet, weight control to achieve/maintain BMI less than 25, BP monitoring, regular exercise and medications as discussed.  Discussed med effects and SE's. Routine screening labs and tests as requested with regular follow-up as recommended. Over 40 minutes of exam, counseling, chart review and high complex critical decision making was performed   Kirtland Bouchard, MD

## 2022-04-18 LAB — URINALYSIS, ROUTINE W REFLEX MICROSCOPIC
Bilirubin Urine: NEGATIVE
Glucose, UA: NEGATIVE
Hgb urine dipstick: NEGATIVE
Ketones, ur: NEGATIVE
Leukocytes,Ua: NEGATIVE
Nitrite: NEGATIVE
Protein, ur: NEGATIVE
Specific Gravity, Urine: 1.01 (ref 1.001–1.035)
pH: <=5 (ref 5.0–8.0)

## 2022-04-18 LAB — CBC WITH DIFFERENTIAL/PLATELET
Absolute Monocytes: 348 cells/uL (ref 200–950)
Basophils Absolute: 40 cells/uL (ref 0–200)
Basophils Relative: 0.6 %
Eosinophils Absolute: 80 cells/uL (ref 15–500)
Eosinophils Relative: 1.2 %
HCT: 47.3 % (ref 38.5–50.0)
Hemoglobin: 14.9 g/dL (ref 13.2–17.1)
Lymphs Abs: 1769 cells/uL (ref 850–3900)
MCH: 20.7 pg — ABNORMAL LOW (ref 27.0–33.0)
MCHC: 31.5 g/dL — ABNORMAL LOW (ref 32.0–36.0)
MCV: 65.6 fL — ABNORMAL LOW (ref 80.0–100.0)
Monocytes Relative: 5.2 %
Neutro Abs: 4462 cells/uL (ref 1500–7800)
Neutrophils Relative %: 66.6 %
Platelets: 190 10*3/uL (ref 140–400)
RBC: 7.21 10*6/uL — ABNORMAL HIGH (ref 4.20–5.80)
RDW: 20 % — ABNORMAL HIGH (ref 11.0–15.0)
Total Lymphocyte: 26.4 %
WBC: 6.7 10*3/uL (ref 3.8–10.8)

## 2022-04-18 LAB — COMPLETE METABOLIC PANEL WITH GFR
AG Ratio: 2.3 (calc) (ref 1.0–2.5)
ALT: 27 U/L (ref 9–46)
AST: 35 U/L (ref 10–35)
Albumin: 4.5 g/dL (ref 3.6–5.1)
Alkaline phosphatase (APISO): 72 U/L (ref 35–144)
BUN/Creatinine Ratio: 16 (calc) (ref 6–22)
BUN: 23 mg/dL (ref 7–25)
CO2: 26 mmol/L (ref 20–32)
Calcium: 9.6 mg/dL (ref 8.6–10.3)
Chloride: 101 mmol/L (ref 98–110)
Creat: 1.45 mg/dL — ABNORMAL HIGH (ref 0.70–1.35)
Globulin: 2 g/dL (calc) (ref 1.9–3.7)
Glucose, Bld: 91 mg/dL (ref 65–99)
Potassium: 5.2 mmol/L (ref 3.5–5.3)
Sodium: 137 mmol/L (ref 135–146)
Total Bilirubin: 1 mg/dL (ref 0.2–1.2)
Total Protein: 6.5 g/dL (ref 6.1–8.1)
eGFR: 53 mL/min/{1.73_m2} — ABNORMAL LOW (ref >=60)

## 2022-04-18 LAB — INSULIN, RANDOM: Insulin: 5.2 u[IU]/mL

## 2022-04-18 LAB — HEMOGLOBIN A1C
Hgb A1c MFr Bld: 5.3 % of total Hgb (ref <5.7)
Mean Plasma Glucose: 105 mg/dL
eAG (mmol/L): 5.8 mmol/L

## 2022-04-18 LAB — LIPID PANEL
Cholesterol: 143 mg/dL (ref <200)
HDL: 29 mg/dL — ABNORMAL LOW (ref >=40)
LDL Cholesterol (Calc): 80 mg/dL (calc)
Non-HDL Cholesterol (Calc): 114 mg/dL (calc) (ref <130)
Total CHOL/HDL Ratio: 4.9 (calc) (ref <5.0)
Triglycerides: 255 mg/dL — ABNORMAL HIGH (ref <150)

## 2022-04-18 LAB — TSH: TSH: 2.06 mIU/L (ref 0.40–4.50)

## 2022-04-18 LAB — PSA: PSA: 0.61 ng/mL (ref <=4.00)

## 2022-04-18 LAB — MICROALBUMIN / CREATININE URINE RATIO
Creatinine, Urine: 90 mg/dL (ref 20–320)
Microalb Creat Ratio: 4 mcg/mg creat (ref <30)
Microalb, Ur: 0.4 mg/dL

## 2022-04-18 LAB — VITAMIN D 25 HYDROXY (VIT D DEFICIENCY, FRACTURES): Vit D, 25-Hydroxy: 77 ng/mL (ref 30–100)

## 2022-04-18 LAB — CBC MORPHOLOGY

## 2022-04-18 LAB — URIC ACID: Uric Acid, Serum: 6 mg/dL (ref 4.0–8.0)

## 2022-04-18 LAB — MAGNESIUM: Magnesium: 2.1 mg/dL (ref 1.5–2.5)

## 2022-04-18 NOTE — Progress Notes (Signed)
<><><><><><><><><><><><><><><><><><><><><><><><><><><><><><><><><> <><><><><><><><><><><><><><><><><><><><><><><><><><><><><><><><><> -   Test results slightly outside the reference range are not unusual. If there is anything important, I will review this with you,  otherwise it is considered normal test values.  If you have further questions,  please do not hesitate to contact me at the office or via My Chart.  <><><><><><><><><><><><><><><><><><><><><><><><><><><><><><><><><>  - CBC- OK - NL Red cell & WBC  <><><><><><><><><><><><><><><><><><><><><><><><><><><><><><><><><>  -  Kidney functions  (GFR) suggest  look a little dehydrated    Very important to drink adequate amounts of fluids to prevent permanent damage    - Recommend drink at least 6 bottles (16 ounces) of fluids /water /day = 96 Oz ~100 oz  - 100 oz = 3,000 cc or 3 liters / day  - >> That's 1 &1/2 bottles of a 2 liter soda bottle /day !   <><><><><><><><><><><><><><><><><><><><><><><><><><><><><><><><><>  -  Total Chol = 143   &   LDL Chol = 80   -    Both  Excellent   - Very low risk for Heart Attack  / Stroke ============================================================ ============================================================  -  PSA - Low  - Great - No Prostate Cancer <><><><><><><><><><><><><><><><><><><><><><><><><><><><><><><><><>  -  A1c -Normal -No Diabetes  - Great  !  <><><><><><><><><><><><><><><><><><><><><><><><><><><><><><><><><>  -  Vitamin D = 77   - Excellent - Please keep dosage  same  <><><><><><><><><><><><><><><><><><><><><><><><><><><><><><><><><>  -  Uric Acid  - Gout  test -OK -  <><><><><><><><><><><><><><><><><><><><><><><><><><><><><><><><><>  -  All Else - CBC - Kidneys - Electrolytes - Liver - Magnesium & Thyroid    - all  Normal / OK ===========================================================

## 2022-05-04 ENCOUNTER — Other Ambulatory Visit: Payer: Self-pay | Admitting: Internal Medicine

## 2022-05-30 ENCOUNTER — Other Ambulatory Visit: Payer: Self-pay | Admitting: Internal Medicine

## 2022-07-06 ENCOUNTER — Other Ambulatory Visit: Payer: Self-pay

## 2022-07-06 DIAGNOSIS — Z1211 Encounter for screening for malignant neoplasm of colon: Secondary | ICD-10-CM

## 2022-07-06 DIAGNOSIS — Z1212 Encounter for screening for malignant neoplasm of rectum: Secondary | ICD-10-CM | POA: Diagnosis not present

## 2022-07-06 LAB — POC HEMOCCULT BLD/STL (HOME/3-CARD/SCREEN)
Card #2 Fecal Occult Blod, POC: NEGATIVE
Card #3 Fecal Occult Blood, POC: NEGATIVE
Fecal Occult Blood, POC: NEGATIVE

## 2022-07-18 ENCOUNTER — Ambulatory Visit: Payer: Medicare PPO | Admitting: Nurse Practitioner

## 2022-07-18 ENCOUNTER — Encounter: Payer: Self-pay | Admitting: Nurse Practitioner

## 2022-07-18 ENCOUNTER — Other Ambulatory Visit: Payer: Self-pay

## 2022-07-18 VITALS — BP 130/80 | HR 42 | Temp 97.6°F | Ht 67.0 in | Wt 172.6 lb

## 2022-07-18 DIAGNOSIS — R7309 Other abnormal glucose: Secondary | ICD-10-CM

## 2022-07-18 DIAGNOSIS — D580 Hereditary spherocytosis: Secondary | ICD-10-CM

## 2022-07-18 DIAGNOSIS — F319 Bipolar disorder, unspecified: Secondary | ICD-10-CM

## 2022-07-18 DIAGNOSIS — F900 Attention-deficit hyperactivity disorder, predominantly inattentive type: Secondary | ICD-10-CM | POA: Diagnosis not present

## 2022-07-18 DIAGNOSIS — M1000 Idiopathic gout, unspecified site: Secondary | ICD-10-CM

## 2022-07-18 DIAGNOSIS — I10 Essential (primary) hypertension: Secondary | ICD-10-CM | POA: Diagnosis not present

## 2022-07-18 DIAGNOSIS — E291 Testicular hypofunction: Secondary | ICD-10-CM

## 2022-07-18 DIAGNOSIS — M549 Dorsalgia, unspecified: Secondary | ICD-10-CM

## 2022-07-18 DIAGNOSIS — E039 Hypothyroidism, unspecified: Secondary | ICD-10-CM | POA: Diagnosis not present

## 2022-07-18 DIAGNOSIS — Z0001 Encounter for general adult medical examination with abnormal findings: Secondary | ICD-10-CM | POA: Diagnosis not present

## 2022-07-18 DIAGNOSIS — Z0000 Encounter for general adult medical examination without abnormal findings: Secondary | ICD-10-CM

## 2022-07-18 DIAGNOSIS — E559 Vitamin D deficiency, unspecified: Secondary | ICD-10-CM | POA: Diagnosis not present

## 2022-07-18 DIAGNOSIS — I700 Atherosclerosis of aorta: Secondary | ICD-10-CM | POA: Diagnosis not present

## 2022-07-18 DIAGNOSIS — E782 Mixed hyperlipidemia: Secondary | ICD-10-CM

## 2022-07-18 DIAGNOSIS — R6889 Other general symptoms and signs: Secondary | ICD-10-CM | POA: Diagnosis not present

## 2022-07-18 DIAGNOSIS — Z79899 Other long term (current) drug therapy: Secondary | ICD-10-CM

## 2022-07-18 MED ORDER — LEVOTHYROXINE SODIUM 25 MCG PO TABS: 25.0000 ug | ORAL_TABLET | Freq: Every day | ORAL | 0 refills | Status: DC

## 2022-07-18 MED ORDER — ATENOLOL 25 MG PO TABS: 25.0000 mg | ORAL_TABLET | Freq: Every day | ORAL | 0 refills | Status: DC

## 2022-07-18 MED ORDER — "BD LUER-LOK SYRINGE 21G X 1"" 3 ML MISC": 0 refills | Status: DC

## 2022-07-18 NOTE — Progress Notes (Signed)
MEDICARE ANNUAL WELLNESS VISIT AND FOLLOW UP Assessment:   Kevin Gomez was seen today for follow-up.  Diagnoses and all orders for this visit:  Initial Medicare annual wellness visit Due Annually Health Maintenance Reviewed Healthy lifestyle reviewed and goals set    Hyperlipidemia, mixed/Aortic atherosclerosis (HCC) by T-Spine Xray on 08/26/2020 Discussed lifestyle modifications. Recommended diet heavy in fruits and veggies, omega 3's. Decrease consumption of animal meats, cheeses, and dairy products. Remain active and exercise as tolerated. Continue to monitor.  Hereditary spherocytosis (HCC) CBC - monitor for anemia  Bipolar disorder with depression (HCC) Follows with Dr. Azucena Fallen Continue stress, mindfulness, exercise Stable  Attention deficit hyperactivity disorder (ADHD), predominantly inattentive type Continue medications and follow with Dr. Azucena Fallen  Essential hypertension Discussed medication changes based on in home readings. Continue Atenolol 50 mg QD - may decrease if continues to remain bradycardic. Continue Olmesartan 40 mg  Discussed DASH (Dietary Approaches to Stop Hypertension) DASH diet is lower in sodium than a typical American diet. Cut back on foods that are high in saturated fat, cholesterol, and trans fats. Eat more whole-grain foods, fish, poultry, and nuts Remain active and exercise as tolerated daily.  Monitor BP at home-Call if greater than 130/80.  Check CMP/CBC  Abnormal glucose Education: Reviewed 'ABCs' of diabetes management  Discussed goals to be met and/or maintained include A1C (<7) Blood pressure (<130/80) Cholesterol (LDL <70) Continue Eye Exam yearly  Continue Dental Exam Q6 mo Discussed dietary recommendations Discussed Physical Activity recommendations Foot exam UTD Check A1C  Hypothyroidism, unspecified type Continue Levothyroxine 25 mcg until labs result Adjust medication dosage as needed. Reminded to take on an empty  stomach 30-22mins before food.  Stop any Biotin Supplement 48-72 hours before next TSH level to reduce the risk of falsely low TSH levels. Continue to monitor.     Vitamin D deficiency Continue supplement for goal of 60-100 Monitor Vitamin D levels  Testosterone Deficiency Continue replacement therapy, check testosterone levels as needed.   Idiopathic gout, unspecified chronicity, unspecified site No recent flares Continue medication and check uric acid PRN Discussed low purine diet.  Medication management All medications discussed and reviewed in full. All questions and concerns regarding medications addressed.    Hypercalcemia PTH w/intact Ca WNL Continue to monitor  Mid back pain Continue Naproxen and Flexeril as needed If symptoms worsen will refer to Ortho   Orders Placed This Encounter  Procedures   CBC with Differential/Platelet   COMPLETE METABOLIC PANEL WITH GFR   Lipid panel   TSH   Hemoglobin A1c   VITAMIN D 25 Hydroxy (Vit-D Deficiency, Fractures)    Notify office for further evaluation and treatment, questions or concerns if any reported s/s fail to improve.   The patient was advised to call back or seek an in-person evaluation if any symptoms worsen or if the condition fails to improve as anticipated.   Further disposition pending results of labs. Discussed med's effects and SE's.    I discussed the assessment and treatment plan with the patient. The patient was provided an opportunity to ask questions and all were answered. The patient agreed with the plan and demonstrated an understanding of the instructions.  Discussed med's effects and SE's. Screening labs and tests as requested with regular follow-up as recommended.  I provided 40 minutes of face-to-face time during this encounter including counseling, chart review, and critical decision making was preformed.  Today's Plan of Care is based on a patient-centered health care approach known as shared  decision making -  the decisions, tests and treatments allow for patient preferences and values to be balanced with clinical evidence.     Future Appointments  Date Time Provider Department Center  10/24/2022  9:30 AM [name], MD [facility] None  04/24/2023 10:00 AM [name], MD [facility] None  07/18/2023 10:00 AM Adela Glimpse, NP [facility] None     Plan:   During the course of the visit the patient was educated and counseled about appropriate screening and preventive services including:   Pneumococcal vaccine  Influenza vaccine Prevnar 13 Td vaccine Screening electrocardiogram Colorectal cancer screening Diabetes screening Glaucoma screening Nutrition counseling    Subjective:  Kevin Gomez is a 67 y.o. male who presents for Medicare Annual Wellness Visit and 4 month follow up. He has Essential hypertension; Hereditary spherocytosis (HCC); ADD (attention deficit disorder); Abnormal glucose; Vitamin D deficiency; Hyperlipidemia, mixed; Testosterone Deficiency; Medication management; Gout; BMI 25.0-25.9,adult; Hypothyroidism; and Aortic atherosclerosis (HCC) by T-Spine Xray on 08/26/2020 on their problem list.  Overall he reports feeling well today.  He is planning a trip to [place] with his wife leaving 08/12/22.  Continues Naproxen PRN, Gabapentin and Voltaren gel for low back pain. No recent flare.  Fairly well managed.   BMI is Body mass index is 27.03 kg/m., he has been working on diet and exercise. Wt Readings from Last 3 Encounters:  07/18/22 172 lb 9.6 oz (78.3 kg)  04/17/22 169 lb 3.2 oz (76.7 kg)  12/27/21 155 lb (70.3 kg)   His blood pressure has been fairly controlled at home, with occasional BP elevations, today their BP is BP: 130/80 During CPE LOV 04/17/22 with Dr. Oneta Rack BP  medications were changed.  Discontinued Lisinopril, changed to Olmesartan 40 mg.  Atenolol was decreased from 100 mg to 50 mg.  His HR is 42 in office today.  He is  asymptomatic.  He now feels as though when he takes Levothyroxine, his BP increases systolically to 160's.  He has reduced his dose of Levothyroxine from 50 mcg daily to 25 mcg daily.  He has done this for the past two months.  States this helps to regulate his BP.  Of note he did have dip in GFR 04/2022 from 69 (12/2021) to 53.  He is trying to stay well hydrated.  BP Readings from Last 3 Encounters:  07/18/22 130/80  04/17/22 120/64  12/27/21 104/76    He does workout. He denies chest pain, shortness of breath, dizziness.   He has hypothyroidism.  Last TSH when taking Levothyroxine 50 mcg was normal.  Lab Results  Component Value Date   TSH 2.06 04/17/2022   He is not on cholesterol medication and denies myalgias. His cholesterol is not at goal. The cholesterol last visit was:   Lab Results  Component Value Date   CHOL 143 04/17/2022   HDL 29 (L) 04/17/2022   LDLCALC 80 04/17/2022   TRIG 255 (H) 04/17/2022   CHOLHDL 4.9 04/17/2022   He has been working on diet and exercise for abnormal glucose.  Last A1C in the office was:  Lab Results  Component Value Date   HGBA1C 5.3 04/17/2022   Last GFR - he has been trying to stay well hydrated.   Lab Results  Component Value Date   EGFR 53 (L) 04/17/2022   Patient is on Vitamin D supplement.   Lab Results  Component Value Date   VD25OH 77 04/17/2022    He has a hx of gout. Takes Allopurinol.  Follows a low purine  diet.  Lab Results  Component Value Date   LABURIC 6.0 04/17/2022   He also has a hx of Bipolar disorder.  Currently well controlled and stable.  Current medications clonazepam, methylphenidate, remeron effective.  Continue to follow with psych.   He is on testosterone therapy.   200 mg weekly.  Lab Results  Component Value Date   TESTOSTERONE 2,145 (H) 09/15/2021     Medication Review:  Current Outpatient Medications (Endocrine & Metabolic):    levothyroxine (SYNTHROID) 50 MCG tablet, TAKE 1 TABLET DAILY ON AN  EMPTY STOMACH WITH ONLY WATER FOR 30 MINUTES & NO ANTACID MEDS, CALCIUM OR MAGNESIUM FOR 4 HOURS & AVOID BIOTIN   testosterone cypionate (DEPOTESTOSTERONE CYPIONATE) 200 MG/ML injection, Inject 1 ml  into  Muscle  every  week  Current Outpatient Medications (Cardiovascular):    atenolol (TENORMIN) 100 MG tablet, TAKE 1 TABLET BY MOUTH EVERY DAY FOR BLOOD PRESSURE   olmesartan (BENICAR) 40 MG tablet, Take 1/2 to 1 tablet  at Night  for BP   tadalafil (CIALIS) 20 MG tablet, Take  1/2 to 1 tablet  every 2 to 3 days  as needed for  XXXX   Current Outpatient Medications (Analgesics):    allopurinol (ZYLOPRIM) 300 MG tablet, TAKE 1/2 TABLET DAILY TO PREVENT GOUT   meloxicam (MOBIC) 15 MG tablet, TAKE 1/2 TO 1 TABLET DAILY WITH FOOD AS NEEDED FOR PAIN & INFLAMMATION   Current Outpatient Medications (Other):    SYRINGE-NEEDLE, DISP, 3 ML (B-D 3CC LUER-LOK SYR 21GX1") 21G X 1" 3 ML MISC, Use to inject testosterone weekly   Cholecalciferol (VITAMIN D) 2000 UNITS tablet, Take 2,000 Units by mouth 4 (four) times daily.    clonazePAM (KLONOPIN) 1 MG tablet, Take 1 mg by mouth 2 (two) times daily as needed. Takes up to 2 tabs daily prn   cyclobenzaprine (FLEXERIL) 10 MG tablet, TAKE 1/2 TO 1 TABLET 2 TO 3 X DAY ONLY IF NEEDED FOR MUSCLE SPASM   diclofenac Sodium (VOLTAREN) 1 % GEL, APPLY 2 TO 4 GRAMS TOPICALLY TO PAINFUL JOINTS 2 TO 4 X /DAY   gabapentin (NEURONTIN) 800 MG tablet, Take  6 tablets  Daily  for Chronic Pain   methylphenidate (METADATE CD) 20 MG CR capsule,    mirtazapine (REMERON) 45 MG tablet, Take 45 mg by mouth at bedtime.   Multiple Vitamin (MULTIVITAMIN) tablet, Take 1 tablet by mouth daily.   NEEDLE, DISP, 21 G 21G X 1" MISC, Use for testosterone injections, 3 ml   Omega-3 Fatty Acids (FISH OIL) 1000 MG CAPS, Take 1,000 mg by mouth.    risperiDONE (RISPERDAL) 2 MG tablet, Take 2 mg by mouth at bedtime.   sertraline (ZOLOFT) 100 MG tablet, Take 100 mg by mouth  daily.  Allergies: Allergies  Allergen Reactions   Serzone [Nefazodone]     Leg cramps   Trazamine [Trazodone & Diet Manage Prod] Other (See Comments)    confusion    Current Problems (verified) has Essential hypertension; Hereditary spherocytosis (HCC); ADD (attention deficit disorder); Abnormal glucose; Vitamin D deficiency; Hyperlipidemia, mixed; Testosterone Deficiency; Medication management; Gout; BMI 25.0-25.9,adult; Hypothyroidism; and Aortic atherosclerosis (HCC) by T-Spine Xray on 08/26/2020 on their problem list.  Screening Tests Immunization History  Administered Date(s) Administered   Influenza Inj Mdck Quad With Preservative 12/06/2017   Influenza, High Dose Seasonal PF 12/17/2021   Influenza, Seasonal, Injecte, Preservative Fre 12/08/2016   Influenza,inj,Quad PF,6+ Mos 12/08/2013   Influenza-Unspecified 12/06/2015, 12/07/2016, 10/22/2018, 12/05/2019   Janssen (J&J)  SARS-COV-2 Vaccination 05/16/2019   PFIZER(Purple Top)SARS-COV-2 Vaccination 12/17/2021   PNEUMOCOCCAL CONJUGATE-20 06/14/2021   PPD Test 07/23/2013, 07/24/2014, 08/09/2015, 08/30/2016, 10/23/2017, 11/26/2018, 12/25/2019, 02/22/2021   Pneumococcal-Unspecified 03/06/2009   Respiratory Syncytial Virus Vaccine,Recomb Aduvanted(Arexvy) 12/17/2021   Td 03/24/2012   Health Maintenance  Topic Date Due   Zoster Vaccines- Shingrix (1 of 2) Never done   COLONOSCOPY (Pts 45-25yrs Insurance coverage will need to be confirmed)  08/18/2019   COVID-19 Vaccine (3 - 2023-24 season) 02/11/2022   DTaP/Tdap/Td (2 - Tdap) 03/24/2022   INFLUENZA VACCINE  10/05/2022   Medicare Annual Wellness (AWV)  07/18/2023   Pneumonia Vaccine 3+ Years old  Completed   Hepatitis C Screening  Completed   HPV VACCINES  Aged Out    Last colonoscopy: 2002, cologuard 03/10/21 negative  Names of Other Physician/Practitioners you currently use: 1. [facility] here for primary care 2. [facility], eye  doctor, last visit 11/2020 3. Dr. Ninetta Lights, dentist, last visit 02/2021  Patient Care Team: [name], MD as PCP - General (Internal Medicine)  Surgical: He  has a past surgical history that includes Fracture surgery; orthopedic surgery (Left); orthopedic surgery (Left); orthopedic surgery (Right); and orthopedic surgery (Right, 2002 dr supple). Family His family history includes Aortic stenosis in his father; Cancer in his mother; Hypertension in his father. Social history  He reports that he has never smoked. He has never used smokeless tobacco. He reports that he does not drink alcohol. No history on file for drug use.  MEDICARE WELLNESS OBJECTIVES: Physical activity:   Cardiac risk factors:   Depression/mood screen:      07/18/2022    1:20 PM  Depression screen PHQ 2/9  Decreased Interest 1  Down, Depressed, Hopeless 1  PHQ - 2 Score 2    ADLs:     07/18/2022    1:21 PM 04/17/2022    6:26 PM  In your present state of health, do you have any difficulty performing the following activities:  Hearing? 0 0  Vision? 0 0  Difficulty concentrating or making decisions? 0 0  Walking or climbing stairs? 0 0  Dressing or bathing? 0 0  Doing errands, shopping? 0 0     Cognitive Testing  Alert? Yes  Normal Appearance?Yes  Oriented to person? Yes  Place? Yes   Time? Yes  Recall of three objects?  Yes  Can perform simple calculations? Yes  Displays appropriate judgment?Yes  Can read the correct time from a watch face?Yes  EOL planning: Does Patient Have a Medical Advance Directive?: No   Objective:   Today's Vitals   07/18/22 1106  BP: 130/80  Pulse: (!) 42  Temp: 97.6 F (36.4 C)  SpO2: 99%  Weight: 172 lb 9.6 oz (78.3 kg)  Height: 5\' 7"  (1.702 m)    Body mass index is 27.03 kg/m.  General appearance: alert, no distress, WD/WN, male HEENT: normocephalic, sclerae anicteric, TMs pearly, nares patent, no discharge or erythema, pharynx normal Oral cavity: MMM,  no lesions Neck: supple, no lymphadenopathy, no thyromegaly, no masses Heart: RRR, normal S1, S2, no murmurs Lungs: CTA bilaterally, no wheezes, rhonchi, or rales Abdomen: +bs, soft, non tender, non distended, no masses, no hepatomegaly, no splenomegaly Musculoskeletal: nontender, no swelling, scoliosis thoracic, unable to reproduce pain Extremities: no edema, no cyanosis, no clubbing Pulses: 2+ symmetric, upper and lower extremities, normal cap refill Neurological: alert, oriented x 3, CN2-12 intact, strength normal upper extremities and lower extremities, sensation normal throughout,  DTRs 2+ throughout, no cerebellar signs, gait normal Psychiatric: normal affect, behavior normal, pleasant   EKG: defer, done at CPE 02/2021 [facility] [place]: defer, done at CPE 02/2021  Medicare Attestation I have personally reviewed: The patient's medical and social history Their use of alcohol, tobacco or illicit drugs Their current medications and supplements The patient's functional ability including ADLs,fall risks, home safety risks, cognitive, and hearing and visual impairment Diet and physical activities Evidence for depression or mood disorders  The patient's weight, height, BMI, and visual acuity have been recorded in the chart.  I have made referrals, counseling, and provided education to the patient based on review of the above and I have provided the patient with a written personalized care plan for preventive services.     Adela Glimpse, NP   07/18/2022

## 2022-07-18 NOTE — Patient Instructions (Signed)

## 2022-07-19 LAB — COMPLETE METABOLIC PANEL WITH GFR
AG Ratio: 2.2 (calc) (ref 1.0–2.5)
ALT: 52 U/L — ABNORMAL HIGH (ref 9–46)
AST: 46 U/L — ABNORMAL HIGH (ref 10–35)
Albumin: 4.7 g/dL (ref 3.6–5.1)
Alkaline phosphatase (APISO): 105 U/L (ref 35–144)
BUN/Creatinine Ratio: 23 (calc) — ABNORMAL HIGH (ref 6–22)
BUN: 30 mg/dL — ABNORMAL HIGH (ref 7–25)
CO2: 25 mmol/L (ref 20–32)
Calcium: 9.7 mg/dL (ref 8.6–10.3)
Chloride: 102 mmol/L (ref 98–110)
Creat: 1.29 mg/dL (ref 0.70–1.35)
Globulin: 2.1 g/dL (calc) (ref 1.9–3.7)
Glucose, Bld: 93 mg/dL (ref 65–99)
Potassium: 5.6 mmol/L — ABNORMAL HIGH (ref 3.5–5.3)
Sodium: 137 mmol/L (ref 135–146)
Total Bilirubin: 0.6 mg/dL (ref 0.2–1.2)
Total Protein: 6.8 g/dL (ref 6.1–8.1)
eGFR: 61 mL/min/{1.73_m2} (ref >=60)

## 2022-07-19 LAB — CBC WITH DIFFERENTIAL/PLATELET
Absolute Monocytes: 378 cells/uL (ref 200–950)
Basophils Absolute: 32 cells/uL (ref 0–200)
Basophils Relative: 0.5 %
Eosinophils Absolute: 120 cells/uL (ref 15–500)
Eosinophils Relative: 1.9 %
HCT: 49.4 % (ref 38.5–50.0)
Hemoglobin: 15 g/dL (ref 13.2–17.1)
Lymphs Abs: 1827 cells/uL (ref 850–3900)
MCH: 19.3 pg — ABNORMAL LOW (ref 27.0–33.0)
MCHC: 30.4 g/dL — ABNORMAL LOW (ref 32.0–36.0)
MCV: 63.7 fL — ABNORMAL LOW (ref 80.0–100.0)
Monocytes Relative: 6 %
Neutro Abs: 3944 cells/uL (ref 1500–7800)
Neutrophils Relative %: 62.6 %
Platelets: 193 10*3/uL (ref 140–400)
RBC: 7.76 10*6/uL — ABNORMAL HIGH (ref 4.20–5.80)
RDW: 18.9 % — ABNORMAL HIGH (ref 11.0–15.0)
Total Lymphocyte: 29 %
WBC: 6.3 10*3/uL (ref 3.8–10.8)

## 2022-07-19 LAB — HEMOGLOBIN A1C
Hgb A1c MFr Bld: 5.5 % of total Hgb (ref <5.7)
Mean Plasma Glucose: 111 mg/dL
eAG (mmol/L): 6.2 mmol/L

## 2022-07-19 LAB — LIPID PANEL
Cholesterol: 185 mg/dL (ref <200)
HDL: 30 mg/dL — ABNORMAL LOW (ref >=40)
Non-HDL Cholesterol (Calc): 155 mg/dL (calc) — ABNORMAL HIGH (ref <130)
Total CHOL/HDL Ratio: 6.2 (calc) — ABNORMAL HIGH (ref <5.0)
Triglycerides: 509 mg/dL — ABNORMAL HIGH (ref <150)

## 2022-07-19 LAB — VITAMIN D 25 HYDROXY (VIT D DEFICIENCY, FRACTURES): Vit D, 25-Hydroxy: 56 ng/mL (ref 30–100)

## 2022-07-19 LAB — TSH: TSH: 2 mIU/L (ref 0.40–4.50)

## 2022-07-20 ENCOUNTER — Other Ambulatory Visit: Payer: Self-pay | Admitting: Nurse Practitioner

## 2022-07-20 DIAGNOSIS — E875 Hyperkalemia: Secondary | ICD-10-CM

## 2022-07-24 ENCOUNTER — Other Ambulatory Visit: Payer: Medicare PPO

## 2022-07-24 ENCOUNTER — Other Ambulatory Visit: Payer: Self-pay

## 2022-07-24 DIAGNOSIS — E875 Hyperkalemia: Secondary | ICD-10-CM | POA: Diagnosis not present

## 2022-07-25 LAB — POTASSIUM: Potassium: 4.8 mmol/L (ref 3.5–5.3)

## 2022-07-26 DIAGNOSIS — F410 Panic disorder [episodic paroxysmal anxiety] without agoraphobia: Secondary | ICD-10-CM | POA: Diagnosis not present

## 2022-07-26 DIAGNOSIS — Z5181 Encounter for therapeutic drug level monitoring: Secondary | ICD-10-CM | POA: Diagnosis not present

## 2022-07-26 DIAGNOSIS — F3341 Major depressive disorder, recurrent, in partial remission: Secondary | ICD-10-CM | POA: Diagnosis not present

## 2022-07-26 DIAGNOSIS — F419 Anxiety disorder, unspecified: Secondary | ICD-10-CM | POA: Diagnosis not present

## 2022-07-30 ENCOUNTER — Other Ambulatory Visit: Payer: Self-pay | Admitting: Internal Medicine

## 2022-08-28 ENCOUNTER — Other Ambulatory Visit: Payer: Self-pay

## 2022-08-28 DIAGNOSIS — M1000 Idiopathic gout, unspecified site: Secondary | ICD-10-CM

## 2022-08-28 MED ORDER — ALLOPURINOL 300 MG PO TABS
ORAL_TABLET | ORAL | 3 refills | Status: DC
Start: 2022-08-28 — End: 2023-09-21

## 2022-09-22 ENCOUNTER — Other Ambulatory Visit: Payer: Self-pay | Admitting: Internal Medicine

## 2022-09-22 DIAGNOSIS — E349 Endocrine disorder, unspecified: Secondary | ICD-10-CM

## 2022-09-27 ENCOUNTER — Encounter: Payer: Self-pay | Admitting: Nurse Practitioner

## 2022-09-27 ENCOUNTER — Ambulatory Visit: Payer: Medicare PPO | Admitting: Nurse Practitioner

## 2022-09-27 VITALS — BP 122/84 | HR 53 | Temp 97.0°F | Ht 67.0 in | Wt 174.4 lb

## 2022-09-27 DIAGNOSIS — B027 Disseminated zoster: Secondary | ICD-10-CM

## 2022-09-27 MED ORDER — FAMCICLOVIR 500 MG PO TABS
500.0000 mg | ORAL_TABLET | Freq: Three times a day (TID) | ORAL | 1 refills | Status: DC
Start: 2022-09-27 — End: 2023-01-29

## 2022-09-27 NOTE — Progress Notes (Signed)
Assessment and Plan:  Kevin Gomez was seen today for an episodic visit.  Diagnoses and all order for this visit:  Disseminated herpes zoster Start Famvir as directed Continue Gabapentin Continue to monitor for spreading of rash not responding to medication accompanied by fever, chills, N/V or flu like symptoms contact office.  - famciclovir (FAMVIR) 500 MG tablet; Take 1 tablet (500 mg total) by mouth 3 (three) times daily. 7 days  Dispense: 21 tablet; Refill: 1  Notify office for further evaluation and treatment, questions or concerns if s/s fail to improve. The risks and benefits of my recommendations, as well as other treatment options were discussed with the patient today. Questions were answered.  Further disposition pending results of labs. Discussed med's effects and SE's.    Over 15 minutes of exam, counseling, chart review, and critical decision making was performed.   Future Appointments  Date Time Provider Department Center  10/24/2022  9:30 AM [name], MD [facility] None  04/24/2023 10:00 AM [name], MD [facility] None  07/18/2023 10:00 AM Adela Glimpse, NP [facility] None    ------------------------------------------------------------------------------------------------------------------   HPI BP 122/84   Pulse (!) 53   Temp (!) 97 F (36.1 C)   Ht 5\' 7"  (1.702 m)   Wt 174 lb 6.4 oz (79.1 kg)   SpO2 98%   BMI 27.31 kg/m   Patient presents to office with complaints of shingles type rash located on right lower back and travels down towards buttocks and wraps around right side.  Rash is blister type and associated with a burning sensation.  Onset was x1 week ago.  He has been vaccinated in 07/2019 and 11/2019.  He takes Gabapentin daily and feels this may have suppressed some of his symptoms.  He denies any flu-like symptoms including fever, chills, N/V, fatigue.    Past Medical History:  Diagnosis Date   ADD (attention deficit disorder)     Hyperlipidemia    Hypertension    Vitamin D deficiency      Allergies  Allergen Reactions   Serzone [Nefazodone]     Leg cramps   Trazamine [Trazodone & Diet Manage Prod] Other (See Comments)    confusion    Current Outpatient Medications on File Prior to Visit  Medication Sig   allopurinol (ZYLOPRIM) 300 MG tablet TAKE 1/2 TABLET DAILY TO PREVENT GOUT   atenolol (TENORMIN) 25 MG tablet Take 1 tablet (25 mg total) by mouth daily.   Cholecalciferol (VITAMIN D) 2000 UNITS tablet Take 2,000 Units by mouth 4 (four) times daily.    clonazePAM (KLONOPIN) 1 MG tablet Take 1 mg by mouth 2 (two) times daily as needed. Takes up to 2 tabs daily prn   diclofenac Sodium (VOLTAREN) 1 % GEL APPLY 2 TO 4 GRAMS TOPICALLY TO PAINFUL JOINTS 2 TO 4 X /DAY   gabapentin (NEURONTIN) 800 MG tablet Take  6 tablets  Daily  for Chronic Pain   levothyroxine (SYNTHROID) 25 MCG tablet Take 1 tablet (25 mcg total) by mouth daily before breakfast.   meloxicam (MOBIC) 15 MG tablet TAKE 1/2 TO 1 TABLET DAILY WITH FOOD AS NEEDED FOR PAIN & INFLAMMATION   methylphenidate (METADATE CD) 20 MG CR capsule    mirtazapine (REMERON) 45 MG tablet Take 45 mg by mouth at bedtime.   Multiple Vitamin (MULTIVITAMIN) tablet Take 1 tablet by mouth daily.   NEEDLE, DISP, 21 G 21G X 1" MISC Use for testosterone injections, 3 ml   olmesartan (BENICAR) 40 MG tablet Take  1/2 to 1 tablet  at Night  for BP   Omega-3 Fatty Acids (FISH OIL) 1000 MG CAPS Take 1,000 mg by mouth.    SYRINGE-NEEDLE, DISP, 3 ML (B-D 3CC LUER-LOK SYR 21GX1") 21G X 1" 3 ML MISC Use to inject testosterone weekly   tadalafil (CIALIS) 20 MG tablet Take  1/2 to 1 tablet  every 2 to 3 days  as needed for  XXXX   testosterone cypionate (DEPOTESTOSTERONE CYPIONATE) 200 MG/ML injection INJECT 1 ML INTO MUSCLE EVERY WEEK   No current facility-administered medications on file prior to visit.    ROS: all negative except what is noted in the HPI.   Physical Exam:  BP  122/84   Pulse (!) 53   Temp (!) 97 F (36.1 C)   Ht 5\' 7"  (1.702 m)   Wt 174 lb 6.4 oz (79.1 kg)   SpO2 98%   BMI 27.31 kg/m   General Appearance: NAD.  Awake, conversant and cooperative. Eyes: PERRLA, EOMs intact.  Sclera white.  Conjunctiva without erythema. Sinuses: No frontal/maxillary tenderness.  No nasal discharge. Nares patent.  ENT/Mouth: Ext aud canals clear.  Bilateral TMs w/DOL and without erythema or bulging. Hearing intact.  Posterior pharynx without swelling or exudate.  Tonsils without swelling or erythema.  Neck: Supple.  No masses, nodules or thyromegaly. Respiratory: Effort is regular with non-labored breathing. Breath sounds are equal bilaterally without rales, rhonchi, wheezing or stridor.  Cardio: RRR with no MRGs. Brisk peripheral pulses without edema.  Abdomen: Active BS in all four quadrants.  Soft and non-tender without guarding, rebound tenderness, hernias or masses. Lymphatics: Non tender without lymphadenopathy.  Musculoskeletal: Full ROM, 5/5 strength, normal ambulation.  No clubbing or cyanosis. Skin: Scattered vesicle type rash with underlying erythema that radiates from right mid back down to buttocks and wraps around right lateral side.  Currently well unopened. Warm.  Appropriate color for ethnicity. Appropriate color for ethnicity. Warm without rashes, lesions, ecchymosis, ulcers.  Neuro: CN II-XII grossly normal. Normal muscle tone without cerebellar symptoms and intact sensation.   Psych: AO X 3,  appropriate mood and affect, insight and judgment.     Adela Glimpse, NP 3:48 PM [facility]

## 2022-10-23 ENCOUNTER — Encounter: Payer: Self-pay | Admitting: Internal Medicine

## 2022-10-23 NOTE — Progress Notes (Unsigned)
Future Appointments  Date Time Provider Department  10/24/2022                6 mo   9:30 AM [name], MD [facility]  04/24/2023                 cpe 10:00 AM [name], MD [facility]  07/18/2023                wellness 10:00 AM Adela Glimpse, NP [facility]    History of Present Illness:       This very nice 67 y.o.m [name] with HTN, HLD, Pre-Diabetes, Hypothyroidism, Testosterone Deficiency  and Vitamin D Deficiency  presents for a 6 month follow up .  Patient also has hx/o Gout controlled on his meds. Patient is on Metadate-CD for ADD with improved focus & Concentration.  Patient also has hx/o Hereditary Spherocytosis w/o anemia.                                                                   Patient is treated for HTN (1998) & BP has been controlled and today's BP is  at goal - 124/82.   Patient has had no complaints of any cardiac type chest pain, palpitations, dyspnea Pollyann [name] /PND, dizziness, claudication or dependent edema.        Hyperlipidemia is controlled with diet .   Last Lipids were at goal except elevated Trig's :  Lab Results  Component Value Date   CHOL 185 07/18/2022   HDL 30 (L) 07/18/2022   LDLCALC not calculated. 07/18/2022   TRIG 509 (H) 07/18/2022   CHOLHDL 6.2 (H) 07/18/2022     Also, the patient has history of PreDiabetes (A1c 5.8%/2010) and has had no symptoms of reactive hypoglycemia, diabetic polys, paresthesias or visual blurring.  Last A1c was normal & at goal :  Lab Results  Component Value Date   HGBA1C 5.2 02/22/2021         Patient was  dx'd Hypothyroid  and started on Thyroid Replacement in Feb 2021.  Patient also has been on parenteral  Testosterone replacement for hx/o Testosterone Deficiency with improved Stamina  & sense of well being.                                              Patient has hx/o Testosterone Deficiency  has been on parenteral replacement with improved Stamina  & sense of well being.         Further, the patient also has history of Vitamin D Deficiency ("31" /2008) and supplements vitamin D without any suspected side-effects. Last vitamin D was at goal:  Lab Results  Component Value Date   VD25OH 62 02/22/2021     Current Outpatient Medications  Medication Instructions   allopurinol  300 MG tablet TAKE 1/2 TABLET DAILY TO PREVENT GOUT   atenolol    25 mg Daily   clonazePAM   1 mg 2  times daily PRN   diclofenac  1 % GEL APPLY 2 TO 4 GRAMS TOPICALLY TO PAINFUL JOINTS 2 TO 4 X /DAY   famciclovir500 mg 3  times daily, 7 days   Fish Oil1,000 mg,  Daily   gabapentin  800 MG tablet Take  6 tablets  Daily  for Chronic Pain   levothyroxine  25 mcg  Daily before breakfast   meloxicam  15 MG tablet TAKE 1/2 TO 1 TABLET DAILY AS NEEDED    METADATE CD) 20 MG  No dose, route, or frequency recorded.   mirtazapine 45 mg Daily at bedtime   Multiple Vitamin  1 tablet, Oral, Daily   olmesartan 40 MG tablet Take 1/2 to 1 tablet  at Night  for BP   tadalafil  20 MG tablet Take  1/2 to 1 tablet  every 2 to 3 days  as needed   testosterone cypio 200 MG/ML  INJECT  1  ml IM EVERY WEEK   Vitamin D  2,000 Units 4 times daily     Allergies  Allergen Reactions   Serzone [Nefazodone] Leg cramps   Trazamine [Trazodone & Diet Manage Prod] confusion         PMHx:   Past Medical History:  Diagnosis Date   ADD (attention deficit disorder)    Anemia    Depression    Elevated hemoglobin A1c    Hyperlipidemia    Hypertension    Vitamin D deficiency      Immunization History  Administered Date(s) Administered   Influenza Inj Mdck Quad  12/06/2017   Influenza, Seasonal,  12/08/2016   Influenza,inj,Quad P 12/08/2013   Influenza 12/07/2016, 10/22/2018, 12/05/2019   Janssen (J&J) SARS-COV-2 Vacc 05/16/2019   PPD Test 10/23/2017, 11/26/2018, 12/25/2019   Pneumococcal-23 03/06/2009   Td 03/24/2012      Past Surgical History:  Procedure Laterality Date   FRACTURE SURGERY     ORTHOPEDIC SURGERY Left    left foot pinning   ORTHOPEDIC SURGERY Left    left foot pinning   ORTHOPEDIC SURGERY Right    right elbow bursectomy  1984   ORTHOPEDIC SURGERY Right 2002 dr supple   right rotator cuff    FHx:    Reviewed / unchanged  SHx:    Reviewed / unchanged   Systems Review:  Constitutional: Denies fever, chills, wt changes, headaches, insomnia, fatigue, night sweats, change in appetite. Eyes: Denies redness, blurred vision, diplopia, discharge, itchy, watery eyes.  ENT: Denies discharge, congestion, post nasal drip, epistaxis, sore throat, earache, hearing loss, dental pain, tinnitus, vertigo, sinus pain, snoring.  CV: Denies chest pain, palpitations, irregular heartbeat, syncope, dyspnea, diaphoresis, orthopnea, PND, claudication or edema. Respiratory: denies cough, dyspnea, DOE, pleurisy, hoarseness, laryngitis, wheezing.  Gastrointestinal: Denies dysphagia, odynophagia, heartburn, reflux, water brash, abdominal pain or cramps, nausea, vomiting, bloating, diarrhea, constipation, hematemesis, melena, hematochezia  or hemorrhoids. Genitourinary: Denies dysuria, frequency, urgency, nocturia, hesitancy, discharge, hematuria or flank pain. Musculoskeletal: Denies arthralgias, myalgias, stiffness, jt. swelling, pain, limping or strain/sprain.  Skin: Denies pruritus, rash, hives, warts, acne, eczema or change in skin lesion(s). Neuro: No weakness, tremor, incoordination, spasms, paresthesia or pain. Psychiatric: Denies confusion, memory loss or sensory loss. Endo: Denies change in weight, skin or hair change.  Heme/Lymph: No excessive bleeding, bruising or enlarged lymph nodes.  Physical Exam  BP 124/82   Pulse (!) 45   Temp 97.9 F (36.6 C)   Resp 16   Ht 5\' 7"  (1.702 m)   Wt 179 lb (81.2 kg)   SpO2 99%   BMI 28.04 kg/m   Appears  well nourished, well groomed  and in no  distress.  Eyes: PERRLA, EOMs, conjunctiva  no swelling or erythema. Sinuses: No frontal/maxillary tenderness ENT/Mouth: EAC's clear, TM's nl w/o erythema, bulging. Nares clear w/o erythema, swelling, exudates. Oropharynx clear without erythema or exudates. Oral hygiene is good. Tongue normal, non obstructing. Hearing intact.  Neck: Supple. Thyroid not palpable. Car 2+/2+ without bruits, nodes or JVD. Chest: Respirations nl with BS clear & equal w/o rales, rhonchi, wheezing or stridor.  Cor: Heart sounds normal w/ regular rate and rhythm without sig. murmurs, gallops, clicks or rubs. Peripheral pulses normal and equal  without edema.  Abdomen: Soft & bowel sounds normal. Non-tender w/o guarding, rebound, hernias, masses or organomegaly.  Lymphatics: Unremarkable.  Musculoskeletal: Full ROM all peripheral extremities, joint stability, 5/5 strength and normal gait.  Skin: Warm, dry without exposed rashes, lesions or ecchymosis apparent.  Neuro: Cranial nerves intact, reflexes equal bilaterally. Sensory-motor testing grossly intact. Tendon reflexes grossly intact.  Pysch: Alert & oriented x 3.  Insight and judgement nl & appropriate. No ideations.  Assessment and Plan:   1. Essential hypertension  - CBC with Differential/Platelet - COMPLETE METABOLIC PANEL WITH GFR - Magnesium - TSH   2. Hyperlipidemia, mixed  - Lipid panel - TSH   3. Abnormal glucose  - Hemoglobin A1c - Insulin, random   4. Vitamin D deficiency  - VITAMIN D 25 Hydroxy    5. Hypothyroidism, unspecified type  - TSH   6. Testosterone Deficiency  - Testosterone   7. Attention deficit hyperactivity disorder (ADHD)   8. Aortic atherosclerosis (HCC) by T-Spine Xray on 08/26/2020  - Lipid panel   9. Idiopathic gout  - Uric acid   10. Medication management  - CBC with Differential/Platelet - COMPLETE METABOLIC PANEL WITH GFR - Magnesium - Lipid panel - TSH - Hemoglobin A1c - Insulin,  random - VITAMIN D 25 Hydroxy  - Testosterone - Uric acid          Discussed  regular exercise, BP monitoring, weight control to achieve/maintain BMI less than 25 and discussed med and SE's. Recommended labs to assess and monitor clinical status with further disposition pending results of labs.  I discussed the assessment and treatment plan with the patient. The patient was provided an opportunity to ask questions and all were answered. The patient agreed with the plan and demonstrated an understanding of the instructions.  I provided over 30 minutes of exam, counseling, chart review and  complex critical decision making.         The patient was advised to call back or seek an in-person evaluation if the symptoms worsen or if the condition fails to improve as anticipated.   Marinus Maw, MD

## 2022-10-23 NOTE — Patient Instructions (Signed)

## 2022-10-24 ENCOUNTER — Ambulatory Visit (INDEPENDENT_AMBULATORY_CARE_PROVIDER_SITE_OTHER): Payer: Medicare PPO | Admitting: Internal Medicine

## 2022-10-24 ENCOUNTER — Encounter: Payer: Self-pay | Admitting: Internal Medicine

## 2022-10-24 VITALS — BP 124/82 | HR 45 | Temp 97.9°F | Resp 16 | Ht 67.0 in | Wt 179.0 lb

## 2022-10-24 DIAGNOSIS — Z79899 Other long term (current) drug therapy: Secondary | ICD-10-CM | POA: Diagnosis not present

## 2022-10-24 DIAGNOSIS — E559 Vitamin D deficiency, unspecified: Secondary | ICD-10-CM | POA: Diagnosis not present

## 2022-10-24 DIAGNOSIS — F900 Attention-deficit hyperactivity disorder, predominantly inattentive type: Secondary | ICD-10-CM | POA: Diagnosis not present

## 2022-10-24 DIAGNOSIS — I700 Atherosclerosis of aorta: Secondary | ICD-10-CM

## 2022-10-24 DIAGNOSIS — M1000 Idiopathic gout, unspecified site: Secondary | ICD-10-CM | POA: Diagnosis not present

## 2022-10-24 DIAGNOSIS — I10 Essential (primary) hypertension: Secondary | ICD-10-CM | POA: Diagnosis not present

## 2022-10-24 DIAGNOSIS — E291 Testicular hypofunction: Secondary | ICD-10-CM | POA: Diagnosis not present

## 2022-10-24 DIAGNOSIS — R7309 Other abnormal glucose: Secondary | ICD-10-CM

## 2022-10-24 DIAGNOSIS — E039 Hypothyroidism, unspecified: Secondary | ICD-10-CM | POA: Diagnosis not present

## 2022-10-24 DIAGNOSIS — E782 Mixed hyperlipidemia: Secondary | ICD-10-CM | POA: Diagnosis not present

## 2022-10-25 ENCOUNTER — Encounter: Payer: Self-pay | Admitting: Internal Medicine

## 2022-10-25 LAB — LIPID PANEL
Cholesterol: 148 mg/dL (ref <200)
HDL: 39 mg/dL — ABNORMAL LOW (ref >=40)
LDL Cholesterol (Calc): 61 mg/dL
Non-HDL Cholesterol (Calc): 109 mg/dL (ref <130)
Total CHOL/HDL Ratio: 3.8 (calc) (ref <5.0)
Triglycerides: 396 mg/dL — ABNORMAL HIGH (ref <150)

## 2022-10-25 LAB — COMPLETE METABOLIC PANEL WITH GFR
AG Ratio: 2.4 (calc) (ref 1.0–2.5)
ALT: 31 U/L (ref 9–46)
AST: 41 U/L — ABNORMAL HIGH (ref 10–35)
Albumin: 4.5 g/dL (ref 3.6–5.1)
Alkaline phosphatase (APISO): 89 U/L (ref 35–144)
BUN: 19 mg/dL (ref 7–25)
CO2: 28 mmol/L (ref 20–32)
Calcium: 9.4 mg/dL (ref 8.6–10.3)
Chloride: 94 mmol/L — ABNORMAL LOW (ref 98–110)
Creat: 1.21 mg/dL (ref 0.70–1.35)
Globulin: 1.9 g/dL (calc) (ref 1.9–3.7)
Glucose, Bld: 94 mg/dL (ref 65–99)
Potassium: 5.3 mmol/L (ref 3.5–5.3)
Sodium: 128 mmol/L — ABNORMAL LOW (ref 135–146)
Total Bilirubin: 1 mg/dL (ref 0.2–1.2)
Total Protein: 6.4 g/dL (ref 6.1–8.1)
eGFR: 66 mL/min/{1.73_m2} (ref >=60)

## 2022-10-25 LAB — URIC ACID: Uric Acid, Serum: 5.2 mg/dL (ref 4.0–8.0)

## 2022-10-25 LAB — CBC WITH DIFFERENTIAL/PLATELET
Absolute Monocytes: 455 {cells}/uL (ref 200–950)
Basophils Absolute: 33 {cells}/uL (ref 0–200)
Basophils Relative: 0.5 %
Eosinophils Absolute: 78 cells/uL (ref 15–500)
Eosinophils Relative: 1.2 %
HCT: 41.3 % (ref 38.5–50.0)
Hemoglobin: 12.7 g/dL — ABNORMAL LOW (ref 13.2–17.1)
Lymphs Abs: 1820 {cells}/uL (ref 850–3900)
MCH: 19.6 pg — ABNORMAL LOW (ref 27.0–33.0)
MCHC: 30.8 g/dL — ABNORMAL LOW (ref 32.0–36.0)
MCV: 63.8 fL — ABNORMAL LOW (ref 80.0–100.0)
Monocytes Relative: 7 %
Neutro Abs: 4115 {cells}/uL (ref 1500–7800)
Neutrophils Relative %: 63.3 %
Platelets: 198 10*3/uL (ref 140–400)
RBC: 6.47 10*6/uL — ABNORMAL HIGH (ref 4.20–5.80)
RDW: 22.1 % — ABNORMAL HIGH (ref 11.0–15.0)
Total Lymphocyte: 28 %
WBC: 6.5 10*3/uL (ref 3.8–10.8)

## 2022-10-25 LAB — TESTOSTERONE: Testosterone: 932 ng/dL — ABNORMAL HIGH (ref 250–827)

## 2022-10-25 LAB — INSULIN, RANDOM: Insulin: 4.6 u[IU]/mL

## 2022-10-25 LAB — MAGNESIUM: Magnesium: 2 mg/dL (ref 1.5–2.5)

## 2022-10-25 LAB — HEMOGLOBIN A1C
Hgb A1c MFr Bld: 5.4 %{Hb} (ref <5.7)
Mean Plasma Glucose: 108 mg/dL
eAG (mmol/L): 6 mmol/L

## 2022-10-25 LAB — TSH: TSH: 2.37 m[IU]/L (ref 0.40–4.50)

## 2022-10-25 LAB — VITAMIN D 25 HYDROXY (VIT D DEFICIENCY, FRACTURES): Vit D, 25-Hydroxy: 61 ng/mL (ref 30–100)

## 2022-10-25 NOTE — Progress Notes (Signed)
^<^<^<^<^<^<^<^<^<^<^<^<^<^<^<^<^<^<^<^<^<^<^<^<^<^<^<^<^<^<^<^<^<^<^<^<^ ^>^>^>^>^>^>^>^>^>^>^>>^>^>^>^>^>^>^>^>^>^>^>^>^>^>^>^>^>^>^>^>^>^>^>^>^>  -  Test results slightly outside the reference range are not unusual. If there is anything important, I will review this with you,  otherwise it is considered normal test values.  If you have further questions,  please do not hesitate to contact me at the office or via My Chart.   ^<^<^<^<^<^<^<^<^<^<^<^<^<^<^<^<^<^<^<^<^<^<^<^<^<^<^<^<^<^<^<^<^<^<^<^<^ ^>^>^>^>^>^>^>^>^>^>^>^>^>^>^>^>^>^>^>^>^>^>^>^>^>^>^>^>^>^>^>^>^>^>^>^>^  -   Testosterone level is OK   ^<^<^<^<^<^<^<^<^<^<^<^<^<^<^<^<^<^<^<^<^<^<^<^<^<^<^<^<^<^<^<^<^<^<^<^<^ ^>^>^>^>^>^>^>^>^>^>^>^>^>^>^>^>^>^>^>^>^>^>^>^>^>^>^>^>^>^>^>^>^>^>^>^>^  -   Sodium level is Decreased ,   So try increasing your Sodium intake by                         using regular table salt ( sodium chloride )  & Salting you foods   ^<^<^<^<^<^<^<^<^<^<^<^<^<^<^<^<^<^<^<^<^<^<^<^<^<^<^<^<^<^<^<^<^<^<^<^<^ ^>^>^>^>^>^>^>^>^>^>^>^>^>^>^>^>^>^>^>^>^>^>^>^>^>^>^>^>^>^>^>^>^>^>^>^>^  -   Chol = 148  &  LDL =- 61    - Both   Excellent   - Very low risk for Heart Attack  / Stroke  ^>^>^>^>^>^>^>^>^>^>^>^>^>^>^>^>^>^>^>^>^>^>^>^>^>^>^>^>^>^>^>^>^>^>^>^>^ ^>^>^>^>^>^>^>^>^>^>^>^>^>^>^>^>^>^>^>^>^>^>^>^>^>^>^>^>^>^>^>^>^>^>^>^>^  -  But  Triglycerides (  = 396  ) or fats in blood are too high                 (   Ideal or  Goal is less than 150  !  )    - Recommend avoid fried & greasy foods,  sweets / candy,   - Avoid white rice  (brown or wild rice or Quinoa is OK),   - Avoid white potatoes  (sweet potatoes are OK)   - Avoid anything made from white flour  - bagels, doughnuts, rolls, buns, biscuits, white and   wheat breads, pizza crust and traditional pasta made of white flour & egg white  - (vegetarian pasta or spinach or wheat pasta is OK).    - Multi-grain bread is OK - like multi-grain flat  bread or  sandwich thins.   - Avoid alcohol in excess.   - Exercise is also important.  ^<^<^<^<^<^<^<^<^<^<^<^<^<^<^<^<^<^<^<^<^<^<^<^<^<^<^<^<^<^<^<^<^<^<^<^<^ ^>^>^>^>^>^>^>^>^>^>^>^>^>^>^>^>^>^>^>^>^>^>^>^>^>^>^>^>^>^>^>^>^>^>^>^>^  -   A1c - Normal  - No Diabetes   - Great  !   ^<^<^<^<^<^<^<^<^<^<^<^<^<^<^<^<^<^<^<^<^<^<^<^<^<^<^<^<^<^<^<^<^<^<^<^<^ ^>^>^>^>^>^>^>^>^>^>^>^>^>^>^>^>^>^>^>^>^>^>^>^>^>^>^>^>^>^>^>^>^>^>^>^>^  -   Vitamin D D = 61 - Excellent - Please keep dosage same   ^<^<^<^<^<^<^<^<^<^<^<^<^<^<^<^<^<^<^<^<^<^<^<^<^<^<^<^<^<^<^<^<^<^<^<^<^ ^>^>^>^>^>^>^>^>^>^>^>^>^>^>^>^>^>^>^>^>^>^>^>^>^>^>^>^>^>^>^>^>^>^>^>^>^  -   All Else - CBC - Kidneys - Liver - Magnesium & Thyroid    - all  Normal / OK  ^<^<^<^<^<^<^<^<^<^<^<^<^<^<^<^<^<^<^<^<^<^<^<^<^<^<^<^<^<^<^<^<^<^<^<^<^ ^>^>^>^>^>^>^>^>^>^>^>^>^>^>^>^>^>^>^>^>^>^>^>^>^>^>^>^>^>^>^>^>^>^>^>^>^

## 2023-01-23 ENCOUNTER — Other Ambulatory Visit: Payer: Self-pay | Admitting: Internal Medicine

## 2023-01-23 DIAGNOSIS — F410 Panic disorder [episodic paroxysmal anxiety] without agoraphobia: Secondary | ICD-10-CM | POA: Diagnosis not present

## 2023-01-23 DIAGNOSIS — F3341 Major depressive disorder, recurrent, in partial remission: Secondary | ICD-10-CM | POA: Diagnosis not present

## 2023-01-29 ENCOUNTER — Encounter: Payer: Self-pay | Admitting: Nurse Practitioner

## 2023-01-29 ENCOUNTER — Ambulatory Visit (INDEPENDENT_AMBULATORY_CARE_PROVIDER_SITE_OTHER): Payer: Medicare PPO | Admitting: Nurse Practitioner

## 2023-01-29 VITALS — BP 122/80 | HR 41 | Temp 97.8°F | Ht 67.0 in | Wt 172.4 lb

## 2023-01-29 DIAGNOSIS — E291 Testicular hypofunction: Secondary | ICD-10-CM

## 2023-01-29 DIAGNOSIS — D580 Hereditary spherocytosis: Secondary | ICD-10-CM | POA: Diagnosis not present

## 2023-01-29 DIAGNOSIS — F900 Attention-deficit hyperactivity disorder, predominantly inattentive type: Secondary | ICD-10-CM

## 2023-01-29 DIAGNOSIS — E782 Mixed hyperlipidemia: Secondary | ICD-10-CM | POA: Diagnosis not present

## 2023-01-29 DIAGNOSIS — R7309 Other abnormal glucose: Secondary | ICD-10-CM

## 2023-01-29 DIAGNOSIS — I700 Atherosclerosis of aorta: Secondary | ICD-10-CM | POA: Diagnosis not present

## 2023-01-29 DIAGNOSIS — I10 Essential (primary) hypertension: Secondary | ICD-10-CM | POA: Diagnosis not present

## 2023-01-29 DIAGNOSIS — M1000 Idiopathic gout, unspecified site: Secondary | ICD-10-CM

## 2023-01-29 DIAGNOSIS — F319 Bipolar disorder, unspecified: Secondary | ICD-10-CM | POA: Diagnosis not present

## 2023-01-29 DIAGNOSIS — M549 Dorsalgia, unspecified: Secondary | ICD-10-CM

## 2023-01-29 DIAGNOSIS — E039 Hypothyroidism, unspecified: Secondary | ICD-10-CM | POA: Diagnosis not present

## 2023-01-29 DIAGNOSIS — E559 Vitamin D deficiency, unspecified: Secondary | ICD-10-CM | POA: Diagnosis not present

## 2023-01-29 DIAGNOSIS — Z79899 Other long term (current) drug therapy: Secondary | ICD-10-CM | POA: Diagnosis not present

## 2023-01-29 NOTE — Progress Notes (Signed)
FOLLOW UP Assessment:   Kevin Gomez was seen today for follow-up.  Diagnoses and all orders for this visit:  Hyperlipidemia, mixed/Aortic atherosclerosis (HCC) by T-Spine Xray on 08/26/2020 Discussed lifestyle modifications. Recommended diet heavy in fruits and veggies, omega 3's. Decrease consumption of animal meats, cheeses, and dairy products. Remain active and exercise as tolerated. Continue to monitor.  Hereditary spherocytosis (HCC) CBC - monitor for anemia  Bipolar disorder with depression (HCC) Follows with Dr. Azucena Fallen Continue stress, mindfulness, exercise Stable  Attention deficit hyperactivity disorder (ADHD), predominantly inattentive type Continue medications and follow with Dr. Azucena Fallen  Essential hypertension Discussed medication changes based on in home readings. Continue Atenolol 50 mg QD - may decrease if continues to remain bradycardic. Continue Olmesartan 40 mg  Discussed DASH (Dietary Approaches to Stop Hypertension) DASH diet is lower in sodium than a typical American diet. Cut back on foods that are high in saturated fat, cholesterol, and trans fats. Eat more whole-grain foods, fish, poultry, and nuts Remain active and exercise as tolerated daily.  Monitor BP at home-Call if greater than 130/80.  Check CMP/CBC  Abnormal glucose Education: Reviewed 'ABCs' of diabetes management  Discussed goals to be met and/or maintained include A1C (<7) Blood pressure (<130/80) Cholesterol (LDL <70) Continue Eye Exam yearly  Continue Dental Exam Q6 mo Discussed dietary recommendations Discussed Physical Activity recommendations Foot exam UTD Check A1C  Hypothyroidism, unspecified type Continue Levothyroxine 25 mcg until labs result Adjust medication dosage as needed. Reminded to take on an empty stomach 30-86mins before food.  Stop any Biotin Supplement 48-72 hours before next TSH level to reduce the risk of falsely low TSH levels. Continue to monitor.      Vitamin D deficiency Continue supplement for goal of 60-100 Monitor Vitamin D levels  Testosterone Deficiency Continue replacement therapy, check testosterone levels as needed.   Idiopathic gout, unspecified chronicity, unspecified site No recent flares Continue medication and check uric acid PRN Discussed low purine diet.  Medication management All medications discussed and reviewed in full. All questions and concerns regarding medications addressed.    Hypercalcemia PTH w/intact Ca WNL Continue to monitor  Mid back pain Continue Naproxen and Flexeril as needed If symptoms worsen will refer to Ortho   Orders Placed This Encounter  Procedures   CBC with Differential/Platelet   COMPLETE METABOLIC PANEL WITH GFR   Lipid panel   TSH      Notify office for further evaluation and treatment, questions or concerns if any reported s/s fail to improve.   The patient was advised to call back or seek an in-person evaluation if any symptoms worsen or if the condition fails to improve as anticipated.   Further disposition pending results of labs. Discussed med's effects and SE's.    I discussed the assessment and treatment plan with the patient. The patient was provided an opportunity to ask questions and all were answered. The patient agreed with the plan and demonstrated an understanding of the instructions.  Discussed med's effects and SE's. Screening labs and tests as requested with regular follow-up as recommended.  I provided 40 minutes of face-to-face time during this encounter including counseling, chart review, and critical decision making was preformed.  Today's Plan of Care is based on a patient-centered health care approach known as shared decision making - the decisions, tests and treatments allow for patient preferences and values to be balanced with clinical evidence.     Future Appointments  Date Time Provider Department Center  04/24/2023 10:00 AM Oneta Rack,  Chrissie Noa, MD [facility] None  07/18/2023 10:00 AM Adela Glimpse, NP [facility] None    Subjective:  Kevin Gomez is a 67 y.o. male who presents for Medicare Annual Wellness Visit and 4 month follow up. He has Essential hypertension; Hereditary spherocytosis (HCC); ADD (attention deficit disorder); Abnormal glucose; Vitamin D deficiency; Hyperlipidemia, mixed; Testosterone Deficiency; Medication management; Gout; BMI 25.0-25.9,adult; Hypothyroidism; and Aortic atherosclerosis (HCC) by T-Spine Xray on 08/26/2020 on their problem list.  Overall he reports feeling well today.  Continues Naproxen PRN, Gabapentin and Voltaren gel for low back pain. No recent flare.  Fairly well managed. Started walking daily and has lost weight and reports has improvement to back pain.  BMI is Body mass index is 27 kg/m., he has been working on diet and exercise. Wt Readings from Last 3 Encounters:  01/29/23 172 lb 6.4 oz (78.2 kg)  10/24/22 179 lb (81.2 kg)  09/27/22 174 lb 6.4 oz (79.1 kg)   His blood pressure has been fairly controlled at home, with occasional BP elevations, today their BP is BP: 122/80 During CPE LOV 04/17/22 with Dr. Oneta Rack BP  medications were changed.  Discontinued Lisinopril, changed to Olmesartan 40 mg.  Atenolol was decreased from 100 mg to 50 mg.  His HR is 42 in office today.  He is asymptomatic.  He now feels as though when he takes Levothyroxine, his BP increases systolically to 160's.  He has reduced his dose of Levothyroxine from 50 mcg daily to 25 mcg daily.  He has done this for the past two months.  States this helps to regulate his BP.  Of note he did have dip in GFR 04/2022 from 69 (12/2021) to 53.  He is trying to stay well hydrated.  BP Readings from Last 3 Encounters:  01/29/23 122/80  10/24/22 124/82  09/27/22 122/84    He does workout. He denies chest pain, shortness of breath, dizziness.   He has hypothyroidism.  Last TSH when taking Levothyroxine 50 mcg was normal.   Lab Results  Component Value Date   TSH 2.37 10/24/2022   He is not on cholesterol medication and denies myalgias. His cholesterol is not at goal. The cholesterol last visit was:   Lab Results  Component Value Date   CHOL 148 10/24/2022   HDL 39 (L) 10/24/2022   LDLCALC 61 10/24/2022   TRIG 396 (H) 10/24/2022   CHOLHDL 3.8 10/24/2022   He has been working on diet and exercise for abnormal glucose.  Last A1C in the office was:  Lab Results  Component Value Date   HGBA1C 5.4 10/24/2022   Last GFR - he has been trying to stay well hydrated.   Lab Results  Component Value Date   EGFR 66 10/24/2022   Patient is on Vitamin D supplement.   Lab Results  Component Value Date   VD25OH 56 10/24/2022    He has a hx of gout. Takes Allopurinol.  Follows a low purine diet.  Lab Results  Component Value Date   LABURIC 5.2 10/24/2022   He also has a hx of Bipolar disorder.  Currently well controlled and stable.  Current medications clonazepam, methylphenidate, remeron effective.  Continue to follow with psych.   He is on testosterone therapy.   200 mg weekly.  Lab Results  Component Value Date   TESTOSTERONE 932 (H) 10/24/2022     Medication Review:  Current Outpatient Medications (Endocrine & Metabolic):    levothyroxine (SYNTHROID) 25 MCG tablet, Take 1 tablet (25 mcg  total) by mouth daily before breakfast.   testosterone cypionate (DEPOTESTOSTERONE CYPIONATE) 200 MG/ML injection, INJECT 1 ML INTO MUSCLE EVERY WEEK  Current Outpatient Medications (Cardiovascular):    atenolol (TENORMIN) 25 MG tablet, Take 1 tablet (25 mg total) by mouth daily.   olmesartan (BENICAR) 40 MG tablet, Take 1/2 to 1 tablet  at Night  for BP   tadalafil (CIALIS) 20 MG tablet, Take  1/2 to 1 tablet  every 2 to 3 days  as needed for  XXXX   Current Outpatient Medications (Analgesics):    allopurinol (ZYLOPRIM) 300 MG tablet, TAKE 1/2 TABLET DAILY TO PREVENT GOUT   meloxicam (MOBIC) 15 MG tablet,  TAKE 1/2 TO 1 TABLET DAILY WITH FOOD AS NEEDED FOR PAIN & INFLAMMATION   Current Outpatient Medications (Other):    Cholecalciferol (VITAMIN D) 2000 UNITS tablet, Take 2,000 Units by mouth 4 (four) times daily.    clonazePAM (KLONOPIN) 1 MG tablet, Take 1 mg by mouth 2 (two) times daily as needed. Takes up to 2 tabs daily prn   diclofenac Sodium (VOLTAREN) 1 % GEL, APPLY 2 TO 4 GRAMS TOPICALLY TO PAINFUL JOINTS 2 TO 4 X /DAY   gabapentin (NEURONTIN) 800 MG tablet, TAKE 6 TABLETS DAILY FOR CHRONIC PAIN   methylphenidate (METADATE CD) 20 MG CR capsule,    mirtazapine (REMERON) 45 MG tablet, Take 45 mg by mouth at bedtime.   Multiple Vitamin (MULTIVITAMIN) tablet, Take 1 tablet by mouth daily.   NEEDLE, DISP, 21 G 21G X 1" MISC, Use for testosterone injections, 3 ml   Omega-3 Fatty Acids (FISH OIL) 1000 MG CAPS, Take 1,000 mg by mouth.    SYRINGE-NEEDLE, DISP, 3 ML (B-D 3CC LUER-LOK SYR 21GX1") 21G X 1" 3 ML MISC, Use to inject testosterone weekly   famciclovir (FAMVIR) 500 MG tablet, Take 1 tablet (500 mg total) by mouth 3 (three) times daily. 7 days  Allergies: Allergies  Allergen Reactions   Serzone [Nefazodone]     Leg cramps   Trazamine [Trazodone & Diet Manage Prod] Other (See Comments)    confusion    Current Problems (verified) has Essential hypertension; Hereditary spherocytosis (HCC); ADD (attention deficit disorder); Abnormal glucose; Vitamin D deficiency; Hyperlipidemia, mixed; Testosterone Deficiency; Medication management; Gout; BMI 25.0-25.9,adult; Hypothyroidism; and Aortic atherosclerosis (HCC) by T-Spine Xray on 08/26/2020 on their problem list.  Screening Tests Immunization History  Administered Date(s) Administered   Influenza Inj Mdck Quad With Preservative 12/06/2017   Influenza, High Dose Seasonal PF 12/17/2021   Influenza, Seasonal, Injecte, Preservative Fre 12/08/2016   Influenza,inj,Quad PF,6+ Mos 12/08/2013   Influenza-Unspecified 12/06/2015, 12/07/2016,  10/22/2018, 12/05/2019, 12/04/2022   Janssen (J&J) SARS-COV-2 Vaccination 05/16/2019   PFIZER(Purple Top)SARS-COV-2 Vaccination 12/17/2021   PNEUMOCOCCAL CONJUGATE-20 06/14/2021   PPD Test 07/23/2013, 07/24/2014, 08/09/2015, 08/30/2016, 10/23/2017, 11/26/2018, 12/25/2019, 02/22/2021   Pneumococcal-Unspecified 03/06/2009   Respiratory Syncytial Virus Vaccine,Recomb Aduvanted(Arexvy) 12/17/2021   Td 03/24/2012   Health Maintenance  Topic Date Due   Zoster Vaccines- Shingrix (1 of 2) Never done   Colonoscopy  08/18/2019   DTaP/Tdap/Td (2 - Tdap) 03/24/2022   COVID-19 Vaccine (3 - 2023-24 season) 11/05/2022   Medicare Annual Wellness (AWV)  07/18/2023   Pneumonia Vaccine 64+ Years old  Completed   INFLUENZA VACCINE  Completed   Hepatitis C Screening  Completed   HPV VACCINES  Aged Out    Patient Care Team: [name], MD as PCP - General (Internal Medicine)  Surgical: He  has a past surgical history that includes Fracture  surgery; orthopedic surgery (Left); orthopedic surgery (Left); orthopedic surgery (Right); and orthopedic surgery (Right, 2002 dr supple). Family His family history includes Aortic stenosis in his father; Cancer in his mother; Hypertension in his father. Social history  He reports that he has never smoked. He has never used smokeless tobacco. He reports that he does not drink alcohol. No history on file for drug use.  Objective:   Today's Vitals   01/29/23 0934  BP: 122/80  Pulse: (!) 41  Temp: 97.8 F (36.6 C)  SpO2: 99%  Weight: 172 lb 6.4 oz (78.2 kg)  Height: 5\' 7"  (1.702 m)    Body mass index is 27 kg/m.  General appearance: alert, no distress, WD/WN, male HEENT: normocephalic, sclerae anicteric, TMs pearly, nares patent, no discharge or erythema, pharynx normal Oral cavity: MMM, no lesions Neck: supple, no lymphadenopathy, no thyromegaly, no masses Heart: RRR, normal S1, S2, no murmurs Lungs: CTA bilaterally, no wheezes, rhonchi, or  rales Abdomen: +bs, soft, non tender, non distended, no masses, no hepatomegaly, no splenomegaly Musculoskeletal: nontender, no swelling, scoliosis thoracic, unable to reproduce pain Extremities: no edema, no cyanosis, no clubbing Pulses: 2+ symmetric, upper and lower extremities, normal cap refill Neurological: alert, oriented x 3, CN2-12 intact, strength normal upper extremities and lower extremities, sensation normal throughout, DTRs 2+ throughout, no cerebellar signs, gait normal Psychiatric: normal affect, behavior normal, pleasant   Delton Stelle, NP   01/29/2023

## 2023-01-29 NOTE — Patient Instructions (Signed)

## 2023-01-30 LAB — CBC WITH DIFFERENTIAL/PLATELET
Absolute Lymphocytes: 2262 {cells}/uL (ref 850–3900)
Absolute Monocytes: 554 {cells}/uL (ref 200–950)
Basophils Absolute: 47 {cells}/uL (ref 0–200)
Basophils Relative: 0.6 %
Eosinophils Absolute: 117 {cells}/uL (ref 15–500)
Eosinophils Relative: 1.5 %
HCT: 50.8 % — ABNORMAL HIGH (ref 38.5–50.0)
Hemoglobin: 14.3 g/dL (ref 13.2–17.1)
MCH: 18 pg — ABNORMAL LOW (ref 27.0–33.0)
MCHC: 28.1 g/dL — ABNORMAL LOW (ref 32.0–36.0)
MCV: 63.9 fL — ABNORMAL LOW (ref 80.0–100.0)
Monocytes Relative: 7.1 %
Neutro Abs: 4820 {cells}/uL (ref 1500–7800)
Neutrophils Relative %: 61.8 %
Platelets: 255 10*3/uL (ref 140–400)
RBC: 7.95 10*6/uL — ABNORMAL HIGH (ref 4.20–5.80)
RDW: 20.4 % — ABNORMAL HIGH (ref 11.0–15.0)
Total Lymphocyte: 29 %
WBC: 7.8 10*3/uL (ref 3.8–10.8)

## 2023-01-30 LAB — COMPLETE METABOLIC PANEL WITH GFR
AG Ratio: 2.3 (calc) (ref 1.0–2.5)
ALT: 28 U/L (ref 9–46)
AST: 43 U/L — ABNORMAL HIGH (ref 10–35)
Albumin: 4.9 g/dL (ref 3.6–5.1)
Alkaline phosphatase (APISO): 91 U/L (ref 35–144)
BUN: 19 mg/dL (ref 7–25)
CO2: 26 mmol/L (ref 20–32)
Calcium: 9.6 mg/dL (ref 8.6–10.3)
Chloride: 103 mmol/L (ref 98–110)
Creat: 1.24 mg/dL (ref 0.70–1.35)
Globulin: 2.1 g/dL (ref 1.9–3.7)
Glucose, Bld: 94 mg/dL (ref 65–99)
Potassium: 5.3 mmol/L (ref 3.5–5.3)
Sodium: 139 mmol/L (ref 135–146)
Total Bilirubin: 0.9 mg/dL (ref 0.2–1.2)
Total Protein: 7 g/dL (ref 6.1–8.1)
eGFR: 64 mL/min/{1.73_m2} (ref >=60)

## 2023-01-30 LAB — TSH: TSH: 2.06 m[IU]/L (ref 0.40–4.50)

## 2023-01-30 LAB — LIPID PANEL
Cholesterol: 165 mg/dL (ref <200)
HDL: 33 mg/dL — ABNORMAL LOW (ref >=40)
LDL Cholesterol (Calc): 91 mg/dL
Non-HDL Cholesterol (Calc): 132 mg/dL — ABNORMAL HIGH (ref <130)
Total CHOL/HDL Ratio: 5 (calc) — ABNORMAL HIGH (ref <5.0)
Triglycerides: 304 mg/dL — ABNORMAL HIGH (ref <150)

## 2023-02-14 ENCOUNTER — Other Ambulatory Visit: Payer: Self-pay | Admitting: Internal Medicine

## 2023-02-14 DIAGNOSIS — E349 Endocrine disorder, unspecified: Secondary | ICD-10-CM

## 2023-04-12 ENCOUNTER — Other Ambulatory Visit: Payer: Self-pay | Admitting: Internal Medicine

## 2023-04-12 DIAGNOSIS — E349 Endocrine disorder, unspecified: Secondary | ICD-10-CM

## 2023-04-24 ENCOUNTER — Encounter: Payer: Medicare PPO | Admitting: Internal Medicine

## 2023-07-03 ENCOUNTER — Ambulatory Visit: Payer: Self-pay | Admitting: Family Medicine

## 2023-07-03 ENCOUNTER — Encounter: Payer: Self-pay | Admitting: Family Medicine

## 2023-07-03 VITALS — BP 108/78 | HR 35 | Temp 97.8°F | Ht 67.0 in | Wt 169.0 lb

## 2023-07-03 DIAGNOSIS — R001 Bradycardia, unspecified: Secondary | ICD-10-CM | POA: Insufficient documentation

## 2023-07-03 DIAGNOSIS — Z23 Encounter for immunization: Secondary | ICD-10-CM

## 2023-07-03 DIAGNOSIS — E349 Endocrine disorder, unspecified: Secondary | ICD-10-CM

## 2023-07-03 DIAGNOSIS — F900 Attention-deficit hyperactivity disorder, predominantly inattentive type: Secondary | ICD-10-CM

## 2023-07-03 DIAGNOSIS — E039 Hypothyroidism, unspecified: Secondary | ICD-10-CM

## 2023-07-03 DIAGNOSIS — I10 Essential (primary) hypertension: Secondary | ICD-10-CM

## 2023-07-03 DIAGNOSIS — M1000 Idiopathic gout, unspecified site: Secondary | ICD-10-CM

## 2023-07-03 DIAGNOSIS — E559 Vitamin D deficiency, unspecified: Secondary | ICD-10-CM | POA: Diagnosis not present

## 2023-07-03 DIAGNOSIS — Z79899 Other long term (current) drug therapy: Secondary | ICD-10-CM

## 2023-07-03 DIAGNOSIS — I700 Atherosclerosis of aorta: Secondary | ICD-10-CM

## 2023-07-03 HISTORY — DX: Endocrine disorder, unspecified: E34.9

## 2023-07-03 HISTORY — DX: Bradycardia, unspecified: R00.1

## 2023-07-03 LAB — COMPREHENSIVE METABOLIC PANEL WITH GFR
ALT: 44 U/L (ref 0–53)
AST: 42 U/L — ABNORMAL HIGH (ref 0–37)
Albumin: 4.8 g/dL (ref 3.5–5.2)
Alkaline Phosphatase: 118 U/L — ABNORMAL HIGH (ref 39–117)
BUN: 21 mg/dL (ref 6–23)
CO2: 33 meq/L — ABNORMAL HIGH (ref 19–32)
Calcium: 10.1 mg/dL (ref 8.4–10.5)
Chloride: 97 meq/L (ref 96–112)
Creatinine, Ser: 1.21 mg/dL (ref 0.40–1.50)
GFR: 62.05 mL/min (ref >60.00)
Glucose, Bld: 94 mg/dL (ref 70–99)
Potassium: 4.8 meq/L (ref 3.5–5.1)
Sodium: 136 meq/L (ref 135–145)
Total Bilirubin: 1 mg/dL (ref 0.2–1.2)
Total Protein: 6.8 g/dL (ref 6.0–8.3)

## 2023-07-03 LAB — CBC WITH DIFFERENTIAL/PLATELET
Basophils Absolute: 0.1 10*3/uL (ref 0.0–0.1)
Basophils Relative: 0.9 % (ref 0.0–3.0)
Eosinophils Absolute: 0.1 10*3/uL (ref 0.0–0.7)
Eosinophils Relative: 1.7 % (ref 0.0–5.0)
HCT: 43.9 % (ref 39.0–52.0)
Hemoglobin: 13.3 g/dL (ref 13.0–17.0)
Lymphocytes Relative: 31.2 % (ref 12.0–46.0)
Lymphs Abs: 2.6 10*3/uL (ref 0.7–4.0)
MCHC: 30.4 g/dL (ref 30.0–36.0)
MCV: 58.5 fl — ABNORMAL LOW (ref 78.0–100.0)
Monocytes Absolute: 0.5 10*3/uL (ref 0.1–1.0)
Monocytes Relative: 5.5 % (ref 3.0–12.0)
Neutro Abs: 5 10*3/uL (ref 1.4–7.7)
Neutrophils Relative %: 60.7 % (ref 43.0–77.0)
Platelets: 154 10*3/uL (ref 150.0–400.0)
RBC: 7.52 Mil/uL — ABNORMAL HIGH (ref 4.22–5.81)
RDW: 19.9 % — ABNORMAL HIGH (ref 11.5–15.5)
WBC: 8.3 10*3/uL (ref 4.0–10.5)

## 2023-07-03 LAB — PSA, MEDICARE: PSA: 0.88 ng/mL (ref 0.10–4.00)

## 2023-07-03 LAB — TSH: TSH: 1.71 u[IU]/mL (ref 0.35–5.50)

## 2023-07-03 LAB — MAGNESIUM: Magnesium: 2 mg/dL (ref 1.5–2.5)

## 2023-07-03 LAB — VITAMIN D 25 HYDROXY (VIT D DEFICIENCY, FRACTURES): VITD: 70.46 ng/mL (ref 30.00–100.00)

## 2023-07-03 MED ORDER — ATENOLOL 25 MG PO TABS: 25.0000 mg | ORAL_TABLET | Freq: Every day | ORAL | 0 refills | Status: DC

## 2023-07-03 MED ORDER — "BD LUER-LOK SYRINGE 21G X 1"" 3 ML MISC": 0 refills | Status: DC

## 2023-07-03 NOTE — Progress Notes (Signed)
 New Patient Office Visit  Subjective    Patient ID: Kevin Gomez, male    DOB: [birth year]  Age: 68 y.o. MRN: [id number]  CC:  Chief Complaint  Patient presents with   Establish Care    Wants to discuss referral to orthopedist for scoliosis, doesn't limit him but bothers at times    HPI Kevin Gomez presents to establish care Previous PCP passed away States he was going to that practice for over 30 years.  Reports taking atenolol  50 mg daily for years. Does was reduced from 100 mg approximately 3 years ago.  States his pulse is usually in the 50s and even in the 60s when he is donating blood.  Walks 12 miles per week and denies dizziness, syncope, chest pain, palpitations, DOE.  Issues with ocular pressure with family history of glaucoma.  States he takes methylphenidate 60 or 40 mg in the morning.  States he has been taking this since 2005.  He is also taking Klonopin and mirtazapine.  These medications are prescribed by his psychiatrist Linde Reveal   History of vitamin D  deficiency.  Taking approximately 8000 IUs daily.  Hypothyroidism-taking levothyroxine  25 mcg daily  Hypertension-atenolol  50 mg and olmesartan  40 mg  History of low testosterone .  States he ran out of his testosterone  approximately 7 weeks ago.  He takes a fish oil Is not on statin therapy   Retired  Married    Outpatient Encounter Medications as of 07/03/2023  Medication Sig   allopurinol  (ZYLOPRIM ) 300 MG tablet TAKE 1/2 TABLET DAILY TO PREVENT GOUT   atenolol  (TENORMIN ) 25 MG tablet Take 1 tablet (25 mg total) by mouth daily.   Cholecalciferol (VITAMIN D ) 2000 UNITS tablet Take 2,000 Units by mouth 4 (four) times daily.    clonazePAM (KLONOPIN) 1 MG tablet Take 1 mg by mouth 2 (two) times daily as needed. Takes up to 2 tabs daily prn (Patient taking differently: Take 1 mg by mouth daily. Takes up to 2 tabs daily prn)   gabapentin  (NEURONTIN ) 800 MG tablet TAKE 6 TABLETS DAILY FOR CHRONIC  PAIN   levothyroxine  (SYNTHROID ) 25 MCG tablet Take 1 tablet (25 mcg total) by mouth daily before breakfast.   meloxicam  (MOBIC ) 15 MG tablet TAKE 1/2 TO 1 TABLET DAILY WITH FOOD AS NEEDED FOR PAIN & INFLAMMATION   methylphenidate (METADATE CD) 20 MG CR capsule    mirtazapine (REMERON) 45 MG tablet Take 45 mg by mouth at bedtime.   Multiple Vitamin (MULTIVITAMIN) tablet Take 1 tablet by mouth daily.   NEEDLE, DISP, 21 G 21G X 1" MISC Use for testosterone  injections, 3 ml   olmesartan  (BENICAR ) 40 MG tablet Take 1/2 to 1 tablet  at Night  for BP   Omega-3 Fatty Acids (FISH OIL) 1000 MG CAPS Take 1,000 mg by mouth.    OMEPRAZOLE PO Take by mouth.   tadalafil  (CIALIS ) 20 MG tablet Take  1/2 to 1 tablet  every 2 to 3 days  as needed for  XXXX   testosterone  cypionate (DEPOTESTOSTERONE CYPIONATE) 200 MG/ML injection INJECT 1 ML INTO MUSCLE EVERY WEEK   [DISCONTINUED] atenolol  (TENORMIN ) 50 MG tablet Take 50 mg by mouth daily.   [DISCONTINUED] SYRINGE-NEEDLE, DISP, 3 ML (B-D 3CC LUER-LOK SYR 21GX1") 21G X 1" 3 ML MISC Use to inject testosterone  weekly   diclofenac  Sodium (VOLTAREN ) 1 % GEL APPLY 2 TO 4 GRAMS TOPICALLY TO PAINFUL JOINTS 2 TO 4 X /DAY (Patient not taking: Reported on 07/03/2023)   SYRINGE-NEEDLE,  DISP, 3 ML (B-D 3CC LUER-LOK SYR 21GX1") 21G X 1" 3 ML MISC Use to inject testosterone  weekly   [DISCONTINUED] atenolol  (TENORMIN ) 25 MG tablet Take 1 tablet (25 mg total) by mouth daily. (Patient not taking: Reported on 07/03/2023)   No facility-administered encounter medications on file as of 07/03/2023.    Past Medical History:  Diagnosis Date   ADD (attention deficit disorder)    Hyperlipidemia    Hypertension    Vitamin D  deficiency     Past Surgical History:  Procedure Laterality Date   FRACTURE SURGERY     ORTHOPEDIC SURGERY Left    left foot pinning   ORTHOPEDIC SURGERY Left    left foot pinning   ORTHOPEDIC SURGERY Right    right elbow bursectomy  1984   ORTHOPEDIC  SURGERY Right 2002 dr supple   right rotator cuff    Family History  Problem Relation Age of Onset   Cancer Mother        melonoma   Hypertension Father    Aortic stenosis Father     Social History   Socioeconomic History   Marital status: Married    Spouse name: Not on file   Number of children: Not on file   Years of education: Not on file   Highest education level: Not on file  Occupational History   Not on file  Tobacco Use   Smoking status: Never   Smokeless tobacco: Never  Substance and Sexual Activity   Alcohol use: No    Alcohol/week: 0.0 standard drinks of alcohol   Drug use: Not on file   Sexual activity: Not on file  Other Topics Concern   Not on file  Social History Narrative   Not on file   Social Drivers of Health   Financial Resource Strain: Not on file  Food Insecurity: Not on file  Transportation Needs: Not on file  Physical Activity: Not on file  Stress: Not on file  Social Connections: Not on file  Intimate Partner Violence: Not on file    Review of Systems  Constitutional:  Negative for chills, fever, malaise/fatigue and weight loss.  Eyes:  Negative for blurred vision and double vision.  Respiratory:  Negative for shortness of breath.   Cardiovascular:  Negative for chest pain, palpitations, orthopnea and leg swelling.  Gastrointestinal:  Negative for abdominal pain, constipation, diarrhea, nausea and vomiting.  Genitourinary:  Negative for dysuria, frequency and urgency.  Musculoskeletal:  Negative for falls.  Neurological:  Negative for dizziness, loss of consciousness, weakness and headaches.        Objective    BP 108/78 (BP Location: Left Arm, Patient Position: Sitting)   Pulse (!) 35   Temp 97.8 F (36.6 C) (Temporal)   Ht 5\' 7"  (1.702 m)   Wt 169 lb (76.7 kg)   SpO2 99%   BMI 26.47 kg/m   Physical Exam Constitutional:      General: He is not in acute distress.    Appearance: He is not ill-appearing.  HENT:      Mouth/Throat:     Mouth: Mucous membranes are moist.  Eyes:     Extraocular Movements: Extraocular movements intact.     Conjunctiva/sclera: Conjunctivae normal.  Cardiovascular:     Rate and Rhythm: Bradycardia present.  Pulmonary:     Effort: Pulmonary effort is normal.     Breath sounds: Normal breath sounds.  Musculoskeletal:     Cervical back: Normal range of motion and neck supple.  Right lower leg: No edema.     Left lower leg: No edema.  Skin:    General: Skin is warm and dry.  Neurological:     General: No focal deficit present.     Mental Status: He is alert and oriented to person, place, and time.     Motor: No weakness.     Coordination: Coordination normal.     Gait: Gait normal.  Psychiatric:        Mood and Affect: Mood normal.        Behavior: Behavior normal.        Thought Content: Thought content normal.         Assessment & Plan:   Problem List Items Addressed This Visit     ADD (attention deficit disorder)   Aortic atherosclerosis (HCC) by T-Spine Xray on 08/26/2020   Relevant Medications   atenolol  (TENORMIN ) 25 MG tablet   Other Relevant Orders   Ambulatory referral to Cardiology   Bradycardia with 31-40 beats per minute - Primary   Relevant Orders   EKG 12-Lead   CBC with Differential/Platelet   Comprehensive metabolic panel with GFR   TSH   Magnesium   Ambulatory referral to Cardiology   Essential hypertension   Relevant Medications   atenolol  (TENORMIN ) 25 MG tablet   Other Relevant Orders   CBC with Differential/Platelet   Comprehensive metabolic panel with GFR   Gout   Hypothyroidism   Relevant Medications   atenolol  (TENORMIN ) 25 MG tablet   Other Relevant Orders   TSH   Medication management   Testosterone  deficiency   Relevant Orders   PSA, Medicare   Testosterone  , Free and Total   Vitamin D  deficiency   Relevant Orders   VITAMIN D  25 Hydroxy (Vit-D Deficiency, Fractures)   Other Visit Diagnoses       Need for  shingles vaccine          He is here to establish care. Pulse in the 30s.  He reports feeling at his baseline.  Denies syncope, dizziness, chest pain, palpitations, shortness of breath.  He is euvolemic. EKG shows marked sinus bradycardia with 1st degree AV block, rate 33, RBBB  Reduced atenolol  from 50 mg to 25 mg. Urgent referral to cardiology for further evaluation.  Advised patient that if he has any dizziness, chest pain, palpitations, severe fatigue or shortness of breath that he will need to call 911 go to the ED.  Check labs including CBC, CMP, thyroid  function  He has been off of testosterone  for approximately 2 months.  Check testosterone  level.  No recent PSA blood test, ordered.  Taking high-dose vitamin D .  Check vitamin D  level  He is under the care of psychiatry for depression and ADD.  He is currently taking methylphenidate, mirtazapine and Klonopin.  Follow-up here in 2 weeks    Return in about 2 weeks (around 07/17/2023) for chronic health conditions.   Alyson Back, NP-C

## 2023-07-03 NOTE — Patient Instructions (Signed)
 Please go downstairs for labs before you leave.  I am reducing your dose of atenolol  from 50 mg to 25 mg starting tomorrow since you have taken your dose today.   Your pulse is very low.  I have referred you to cardiology and they will call you to schedule a visit.  If you have any dizziness, chest pain, palpitations, shortness of breath or severe fatigue, please go to the emergency department or call 911.  We will be in touch with your lab results

## 2023-07-06 ENCOUNTER — Encounter: Payer: Self-pay | Admitting: Family Medicine

## 2023-07-08 LAB — TESTOSTERONE, FREE & TOTAL
Free Testosterone: 42.1 pg/mL (ref 35.0–155.0)
Testosterone, Total, LC-MS-MS: 277 ng/dL (ref 250–1100)

## 2023-07-13 ENCOUNTER — Other Ambulatory Visit: Payer: Self-pay

## 2023-07-13 DIAGNOSIS — I10 Essential (primary) hypertension: Secondary | ICD-10-CM | POA: Insufficient documentation

## 2023-07-13 DIAGNOSIS — E785 Hyperlipidemia, unspecified: Secondary | ICD-10-CM | POA: Insufficient documentation

## 2023-07-16 ENCOUNTER — Ambulatory Visit

## 2023-07-16 VITALS — BP 120/80 | HR 41 | Ht 70.0 in | Wt 166.4 lb

## 2023-07-16 DIAGNOSIS — I4510 Unspecified right bundle-branch block: Secondary | ICD-10-CM

## 2023-07-16 DIAGNOSIS — R001 Bradycardia, unspecified: Secondary | ICD-10-CM | POA: Diagnosis not present

## 2023-07-16 HISTORY — DX: Unspecified right bundle-branch block: I45.10

## 2023-07-16 NOTE — Assessment & Plan Note (Signed)
 Incident finding associated with sinus bradycardia.  Will obtain transthoracic echocardiogram to rule out cardiac structure and function abnormalities.

## 2023-07-16 NOTE — Progress Notes (Signed)
 Cardiology Consultation:    Date:  07/16/2023   ID:  Kevin Gomez, DOB 1955-06-14, MRN [id number]  PCP:  Kevin Abraham, NP-C  Cardiologist:  Kevin Kells, MD   Referring MD: Kevin Abraham, NP-C   No chief complaint on file.    ASSESSMENT AND PLAN:   [name] 68 year old male patient with history of hypertension, glaucoma, ADD, vitamin D  deficiency, hypothyroidism, hypertension, low testosterone , sinus bradycardia and right bundle branch block on recent follow-up with PCP in the setting of ongoing use with atenolol  50 mg once daily, dose was lowered to 25 mg and referred for further evaluation.  He remains asymptomatic.   Problem List Items Addressed This Visit     Bradycardia with 31-40 beats per minute - Primary   Asymptomatic sinus bradycardia recently at PCP visit. Heart rates now in 40s sinus rhythm. Remains asymptomatic.  Potential etiology could be ongoing use of atenolol , history of hypothyroidism. Differential diagnosis other possibilities of sinus node dysfunction.  Discontinue atenolol . Continue to monitor blood pressures at home and if persistently elevated above 130/80 mmHg would require other antihypertensive medications. Advised to monitor TSH/thyroid  function closely and titrate up levothyroxine  dose as tolerated. - Will obtain transthoracic echocardiogram for cardiac structure and function assessment in the setting of bradycardia and right bundle branch block. -Will obtain 7-day Zio patch to assess if any significant marked bradycardia or conduction abnormalities or pauses noted.        Relevant Orders   EKG 12-Lead (Completed)   LONG TERM MONITOR (3-14 DAYS)   ECHOCARDIOGRAM COMPLETE   Right bundle branch block   Incident finding associated with sinus bradycardia.  Will obtain transthoracic echocardiogram to rule out cardiac structure and function abnormalities.      Return to clinic tentatively in 2 to 3 months.   History of  Present Illness:    Kevin Gomez is a 68 y.o. male who is being seen today for the evaluation of bradycardia at the request of Kevin Gomez, Kevin L, NP-C.  Has history of hypertension, glaucoma, ADD, vitamin D  deficiency, hypothyroidism, hypertension, low testosterone , sinus bradycardia asymptomatic. Denies any significant prior history of CAD, CHF, MI, CVA.  No prior echocardiogram or stress test.  Pleasant gentleman here for the visit by himself.  Lives with his wife at home.  Keeps busy with day-to-day activities in and around the house.  They walk regularly up to 3 miles in the local park at a brisk pace.  Mentions he has a longstanding history and awareness of his heart rate being slow at various healthcare visits.  Has not caused him any symptoms.  Recently when he went in for blood donation he was told his heart rates were very low. He establish care with a new PCP and with slow heart rates EKG done showed marked sinus bradycardia with heart rates in 30s with QRS mildly increased in duration and referred here for further evaluation.  He mentions he remains asymptomatic.  Denies any lightheadedness, dizziness, syncopal or near syncopal episodes. Denies any chest pain, shortness of breath, orthopnea, paroxysmal nocturnal dyspnea.  Continues to be compliant with his medications. Mentions good blood pressure control at home. Atenolol  which he was taking previously 100 mg once a day was lowered to 50 mg once daily several months ago.  More recently after his PCP visit and concerns about bradycardia atenolol  dose was further decreased to 25 mg once daily on April 29. He uses clonazepam as needed up to 3 times a week.  Has been on levothyroxine  for couple years at the same dose 25 mcg once daily. Recent TSH levels from 07-03-2023 was 1.7. Takes methylphenidate once a day.  Denies any smoking, alcohol, drug use history.  EKG in the clinic today shows sinus bradycardia heart rate 41/min, PR  interval 260 ms, QTc 343 ms.  QRS duration 126 ms consistent with RBBB morphology.  Blood work from 4-29-2025Reviewed noted CBC unremarkable with hemoglobin 13.3, hematocrit 43.9, platelets 154, WBC 8.3. CMP with BUN 21, creatinine 1.21, EGFR 62. Alkaline phosphatase mildly elevated 118, AST mildly elevated 42 and comparable to prior blood work. ALT normal. Normal sodium 136 and potassium 4.8. TSH normal 1.71.  T3 and T4 not available to review. Magnesium 2 Testosterone  free levels were within normal range to 77.  Last lipid panel available to review is from  01/29/2023 with total cholesterol 165, HDL 33, LDL 91, triglycerides 304.   Past Medical History:  Diagnosis Date   Abnormal glucose    ADD (attention deficit disorder)    Aortic atherosclerosis (HCC) by T-Spine Xray on 08/26/2020 08/28/2020   BMI 25.0-25.9,adult 01/26/2015   Bradycardia with 31-40 beats per minute 07/03/2023   Essential hypertension    Gout 03/19/2014   Hereditary spherocytosis (HCC)    Hyperlipidemia    Hyperlipidemia, mixed 01/23/2013   Hypertension    Hypothyroidism 04/14/2019   Medication management 03/19/2014   Testosterone  Deficiency 05/09/2013   Testosterone  deficiency 07/03/2023   Vitamin D  deficiency     Past Surgical History:  Procedure Laterality Date   FRACTURE SURGERY     ORTHOPEDIC SURGERY Left    left foot pinning   ORTHOPEDIC SURGERY Left    left foot pinning   ORTHOPEDIC SURGERY Right    right elbow bursectomy  1984   ORTHOPEDIC SURGERY Right 2002 dr supple   right rotator cuff    Current Medications: Current Meds  Medication Sig   allopurinol  (ZYLOPRIM ) 300 MG tablet TAKE 1/2 TABLET DAILY TO PREVENT GOUT   atenolol  (TENORMIN ) 25 MG tablet Take 1 tablet (25 mg total) by mouth daily.   Cholecalciferol (VITAMIN D ) 2000 UNITS tablet Take 2,000 Units by mouth 4 (four) times daily.    clonazePAM (KLONOPIN) 1 MG tablet Take 1 mg by mouth 2 (two) times daily as needed. Takes up to  2 tabs daily prn (Patient taking differently: Take 1 mg by mouth daily. Takes up to 2 tabs daily prn)   diclofenac  Sodium (VOLTAREN ) 1 % GEL APPLY 2 TO 4 GRAMS TOPICALLY TO PAINFUL JOINTS 2 TO 4 X /DAY   gabapentin  (NEURONTIN ) 800 MG tablet TAKE 6 TABLETS DAILY FOR CHRONIC PAIN   levothyroxine  (SYNTHROID ) 25 MCG tablet Take 1 tablet (25 mcg total) by mouth daily before breakfast.   meloxicam  (MOBIC ) 15 MG tablet TAKE 1/2 TO 1 TABLET DAILY WITH FOOD AS NEEDED FOR PAIN & INFLAMMATION   methylphenidate (METADATE CD) 20 MG CR capsule Take 60 mg by mouth daily.   mirtazapine (REMERON) 45 MG tablet Take 45 mg by mouth at bedtime.   Multiple Vitamin (MULTIVITAMIN) tablet Take 1 tablet by mouth daily.   NEEDLE, DISP, 21 G 21G X 1" MISC Use for testosterone  injections, 3 ml   olmesartan  (BENICAR ) 40 MG tablet Take 1/2 to 1 tablet  at Night  for BP   Omega-3 Fatty Acids (FISH OIL) 1000 MG CAPS Take 1,000 mg by mouth.    OMEPRAZOLE PO Take by mouth.   SYRINGE-NEEDLE, DISP, 3 ML (B-D 3CC LUER-LOK SYR  21GX1") 21G X 1" 3 ML MISC Use to inject testosterone  weekly   tadalafil  (CIALIS ) 20 MG tablet Take  1/2 to 1 tablet  every 2 to 3 days  as needed for  XXXX   testosterone  cypionate (DEPOTESTOSTERONE CYPIONATE) 200 MG/ML injection INJECT 1 ML INTO MUSCLE EVERY WEEK     Allergies:   Serzone [nefazodone] and Trazamine [trazodone & diet manage prod]   Social History   Socioeconomic History   Marital status: Married    Spouse name: Not on file   Number of children: Not on file   Years of education: Not on file   Highest education level: Not on file  Occupational History   Not on file  Tobacco Use   Smoking status: Never   Smokeless tobacco: Never  Substance and Sexual Activity   Alcohol use: No    Alcohol/week: 0.0 standard drinks of alcohol   Drug use: Not on file   Sexual activity: Not on file  Other Topics Concern   Not on file  Social History Narrative   Not on file   Social Drivers of  Health   Financial Resource Strain: Not on file  Food Insecurity: Not on file  Transportation Needs: Not on file  Physical Activity: Not on file  Stress: Not on file  Social Connections: Not on file     Family History: The patient's family history includes Aortic stenosis in his father; Cancer in his mother; Hypertension in his father. ROS:   Please see the history of present illness.    All 14 point review of systems negative except as described per history of present illness.  EKGs/Labs/Other Studies Reviewed:    The following studies were reviewed today:   EKG:  EKG Interpretation Date/Time:  Monday Jul 16 2023 08:31:44 EDT Ventricular Rate:  41 PR Interval:  216 QRS Duration:  126 QT Interval:  416 QTC Calculation: 343 R Axis:   -12  Text Interpretation: Marked sinus bradycardia with 1st degree A-V block Left ventricular hypertrophy with QRS widening Abnormal ECG No previous ECGs available Confirmed by Bertha Broad reddy [phone]) on 07/16/2023 9:00:49 AM    Recent Labs: 07/03/2023: ALT 44; BUN 21; Creatinine, Ser 1.21; Hemoglobin 13.3; Magnesium 2.0; Platelets 154.0; Potassium 4.8; Sodium 136; TSH 1.71  Recent Lipid Panel    Component Value Date/Time   CHOL 165 01/29/2023 1032   TRIG 304 (H) 01/29/2023 1032   HDL 33 (Gomez) 01/29/2023 1032   CHOLHDL 5.0 (H) 01/29/2023 1032   VLDL 73 (H) 08/30/2016 1004   LDLCALC 91 01/29/2023 1032    Physical Exam:    VS:  BP 120/80   Pulse (!) 41   Ht 5\' 10"  (1.778 m)   Wt 166 lb 6.4 oz (75.5 kg)   SpO2 96%   BMI 23.88 kg/m     Wt Readings from Last 3 Encounters:  07/16/23 166 lb 6.4 oz (75.5 kg)  07/03/23 169 lb (76.7 kg)  01/29/23 172 lb 6.4 oz (78.2 kg)     GENERAL:  Well nourished, well developed in no acute distress NECK: No JVD; No carotid bruits CARDIAC: RRR, S1 and S2 present, no murmurs, no rubs, no gallops CHEST:  Clear to auscultation without rales, wheezing or rhonchi  Extremities: No pitting pedal  edema. Pulses bilaterally symmetric with radial 2+ and dorsalis pedis 2+ NEUROLOGIC:  Alert and oriented x 3  Medication Adjustments/Labs and Tests Ordered: Current medicines are reviewed at length with the patient today.  Concerns regarding  medicines are outlined above.  Orders Placed This Encounter  Procedures   LONG TERM MONITOR (3-14 DAYS)   EKG 12-Lead   ECHOCARDIOGRAM COMPLETE   No orders of the defined types were placed in this encounter.   Signed, Jamiaya Bina reddy Chong January, MD, MPH, [facility]. 07/16/2023 9:24 AM    [facility]

## 2023-07-16 NOTE — Patient Instructions (Addendum)
 Medication Instructions:  Your physician has recommended you make the following change in your medication:  Stop Atenolol   *If you need a refill on your cardiac medications before your next appointment, please call your pharmacy*  Lab Work: NONE If you have labs (blood work) drawn today and your tests are completely normal, you will receive your results only by: MyChart Message (if you have MyChart) OR A paper copy in the mail If you have any lab test that is abnormal or we need to change your treatment, we will call you to review the results.  Testing/Procedures: Your physician has requested that you have an echocardiogram. Echocardiography is a painless test that uses sound waves to create images of your heart. It provides your doctor with information about the size and shape of your heart and how well your heart's chambers and valves are working. This procedure takes approximately one hour. There are no restrictions for this procedure. Please do NOT wear cologne, perfume, aftershave, or lotions (deodorant is allowed). Please arrive 15 minutes prior to your appointment time.  Please note: We ask at that you not bring children with you during ultrasound (echo/ vascular) testing. Due to room size and safety concerns, children are not allowed in the ultrasound rooms during exams. Our front office staff cannot provide observation of children in our lobby area while testing is being conducted. An adult accompanying a patient to their appointment will only be allowed in the ultrasound room at the discretion of the ultrasound technician under special circumstances. We apologize for any inconvenience.   You have been asked to wear a Zio Heart Monitor today. It is to be worn for 7 days. Please remove the monitor on 5/19 and mail back in the box provided.  If you have any questions about the monitor please call the company at [phone]    Follow-Up: At [facility], you and your health  needs are our priority.  As part of our continuing mission to provide you with exceptional heart care, our providers are all part of one team.  This team includes your primary Cardiologist (physician) and Advanced Practice Providers or APPs (Physician Assistants and Nurse Practitioners) who all work together to provide you with the care you need, when you need it.  Your next appointment:   2-3 month(s)  Provider:   Bertha Broad, MD    We recommend signing up for the patient portal called "MyChart".  Sign up information is provided on this After Visit Summary.  MyChart is used to connect with patients for Virtual Visits (Telemedicine).  Patients are able to view lab/test results, encounter notes, upcoming appointments, etc.  Non-urgent messages can be sent to your provider as well.   To learn more about what you can do with MyChart, go to [email or web address].   Other Instructions Check and record BP two times daily, once in the morning 1-2 hours after taking medication and once in the evening before dinner. You can drop off log at our office or send it through MyChart.

## 2023-07-16 NOTE — Assessment & Plan Note (Addendum)
 Asymptomatic sinus bradycardia recently at PCP visit. Heart rates now in 40s sinus rhythm. Remains asymptomatic.  Potential etiology could be ongoing use of atenolol , history of hypothyroidism. Differential diagnosis other possibilities of sinus node dysfunction.  Discontinue atenolol . Continue to monitor blood pressures at home and if persistently elevated above 130/80 mmHg would require other antihypertensive medications. Advised to monitor TSH/thyroid  function closely and titrate up levothyroxine  dose as tolerated. - Will obtain transthoracic echocardiogram for cardiac structure and function assessment in the setting of bradycardia and right bundle branch block. -Will obtain 7-day Zio patch to assess if any significant marked bradycardia or conduction abnormalities or pauses noted.

## 2023-07-17 ENCOUNTER — Ambulatory Visit: Admitting: Family Medicine

## 2023-07-17 ENCOUNTER — Encounter: Payer: Self-pay | Admitting: Family Medicine

## 2023-07-17 VITALS — BP 120/82 | HR 55 | Temp 97.3°F | Ht 70.0 in | Wt 167.0 lb

## 2023-07-17 DIAGNOSIS — R001 Bradycardia, unspecified: Secondary | ICD-10-CM | POA: Diagnosis not present

## 2023-07-17 DIAGNOSIS — R7401 Elevation of levels of liver transaminase levels: Secondary | ICD-10-CM

## 2023-07-17 DIAGNOSIS — I10 Essential (primary) hypertension: Secondary | ICD-10-CM

## 2023-07-17 DIAGNOSIS — E039 Hypothyroidism, unspecified: Secondary | ICD-10-CM | POA: Diagnosis not present

## 2023-07-17 NOTE — Progress Notes (Signed)
 Subjective:     Patient ID: Kevin Gomez, male    DOB: [birth year], 68 y.o.   MRN: [id number]  Chief Complaint  Patient presents with   Medical Management of Chronic Issues    2 week f/u for low pulse rate... Pt seen cardiology on 07/16/2023 and was taken off of Atenolol ... Pt does have a Heart monitor on and is scheduled for an ECHO to be sone as well.    HPI History of Present Illness         Her is here to follow up on asymptomatic bradycardia with pulse in the 30s. He saw cardiology and is currently wearing a cardiac monitor. Atenolol  was stopped.   States he has been off of testosterone  for 1 month and cannot tell any difference from when he was taking it. Testosterone  level low normal range.   Elevated AST for years. Does not drink alcohol.  He thinks elevation may be due to allopurinol . No imaging done in the past.     Health Maintenance Due  Topic Date Due   Colonoscopy  08/18/2019   DTaP/Tdap/Td (2 - Tdap) 03/24/2022   Medicare Annual Wellness (AWV)  07/18/2023    Past Medical History:  Diagnosis Date   Abnormal glucose    ADD (attention deficit disorder)    Aortic atherosclerosis (HCC) by T-Spine Xray on 08/26/2020 08/28/2020   BMI 25.0-25.9,adult 01/26/2015   Bradycardia with 31-40 beats per minute 07/03/2023   Essential hypertension    Gout 03/19/2014   Hereditary spherocytosis (HCC)    Hyperlipidemia    Hyperlipidemia, mixed 01/23/2013   Hypertension    Hypothyroidism 04/14/2019   Medication management 03/19/2014   Testosterone  Deficiency 05/09/2013   Testosterone  deficiency 07/03/2023   Vitamin D  deficiency     Past Surgical History:  Procedure Laterality Date   FRACTURE SURGERY     ORTHOPEDIC SURGERY Left    left foot pinning   ORTHOPEDIC SURGERY Left    left foot pinning   ORTHOPEDIC SURGERY Right    right elbow bursectomy  1984   ORTHOPEDIC SURGERY Right 2002 dr supple   right rotator cuff    Family History  Problem Relation Age of  Onset   Cancer Mother        melonoma   Hypertension Father    Aortic stenosis Father     Social History   Socioeconomic History   Marital status: Married    Spouse name: Not on file   Number of children: Not on file   Years of education: Not on file   Highest education level: Not on file  Occupational History   Not on file  Tobacco Use   Smoking status: Never   Smokeless tobacco: Never  Substance and Sexual Activity   Alcohol use: No    Alcohol/week: 0.0 standard drinks of alcohol   Drug use: Not on file   Sexual activity: Not on file  Other Topics Concern   Not on file  Social History Narrative   Not on file   Social Drivers of Health   Financial Resource Strain: Not on file  Food Insecurity: Not on file  Transportation Needs: Not on file  Physical Activity: Not on file  Stress: Not on file  Social Connections: Not on file  Intimate Partner Violence: Not on file    Outpatient Medications Prior to Visit  Medication Sig Dispense Refill   allopurinol  (ZYLOPRIM ) 300 MG tablet TAKE 1/2 TABLET DAILY TO PREVENT GOUT 45 tablet 3  Cholecalciferol (VITAMIN D ) 2000 UNITS tablet Take 2,000 Units by mouth 4 (four) times daily.      clonazePAM (KLONOPIN) 1 MG tablet Take 1 mg by mouth 2 (two) times daily as needed. Takes up to 2 tabs daily prn (Patient taking differently: Take 1 mg by mouth daily. Takes up to 2 tabs daily prn)     diclofenac  Sodium (VOLTAREN ) 1 % GEL APPLY 2 TO 4 GRAMS TOPICALLY TO PAINFUL JOINTS 2 TO 4 X /DAY 100 g 3   gabapentin  (NEURONTIN ) 800 MG tablet TAKE 6 TABLETS DAILY FOR CHRONIC PAIN 540 tablet 3   levothyroxine  (SYNTHROID ) 25 MCG tablet Take 1 tablet (25 mcg total) by mouth daily before breakfast. 30 tablet 0   meloxicam  (MOBIC ) 15 MG tablet TAKE 1/2 TO 1 TABLET DAILY WITH FOOD AS NEEDED FOR PAIN & INFLAMMATION 90 tablet 3   methylphenidate (METADATE CD) 20 MG CR capsule Take 60 mg by mouth daily.     mirtazapine (REMERON) 45 MG tablet Take 45 mg by  mouth at bedtime.     Multiple Vitamin (MULTIVITAMIN) tablet Take 1 tablet by mouth daily.     NEEDLE, DISP, 21 G 21G X 1" MISC Use for testosterone  injections, 3 ml 50 each 1   olmesartan  (BENICAR ) 40 MG tablet Take 1/2 to 1 tablet  at Night  for BP 90 tablet 3   Omega-3 Fatty Acids (FISH OIL) 1000 MG CAPS Take 1,000 mg by mouth.      OMEPRAZOLE PO Take by mouth.     SYRINGE-NEEDLE, DISP, 3 ML (B-D 3CC LUER-LOK SYR 21GX1") 21G X 1" 3 ML MISC Use to inject testosterone  weekly 100 each 0   tadalafil  (CIALIS ) 20 MG tablet Take  1/2 to 1 tablet  every 2 to 3 days  as needed for  XXXX 30 tablet 1   testosterone  cypionate (DEPOTESTOSTERONE CYPIONATE) 200 MG/ML injection INJECT 1 ML INTO MUSCLE EVERY WEEK 10 mL 0   No facility-administered medications prior to visit.    Allergies  Allergen Reactions   Serzone [Nefazodone]     Leg cramps   Trazamine [Trazodone & Diet Manage Prod] Other (See Comments)    confusion    Review of Systems  Constitutional:  Negative for chills, fever and malaise/fatigue.  Respiratory:  Negative for shortness of breath.   Cardiovascular:  Negative for chest pain, palpitations and leg swelling.  Gastrointestinal:  Negative for abdominal pain, constipation, diarrhea, nausea and vomiting.  Genitourinary:  Negative for dysuria, frequency and urgency.  Neurological:  Negative for dizziness, focal weakness and headaches.       Objective:     Physical Exam Constitutional:      General: He is not in acute distress.    Appearance: He is not ill-appearing.  Eyes:     Extraocular Movements: Extraocular movements intact.     Conjunctiva/sclera: Conjunctivae normal.  Cardiovascular:     Rate and Rhythm: Normal rate.  Pulmonary:     Effort: Pulmonary effort is normal.  Musculoskeletal:     Cervical back: Normal range of motion and neck supple.  Skin:    General: Skin is warm and dry.  Neurological:     General: No focal deficit present.     Mental Status: He  is alert and oriented to person, place, and time.  Psychiatric:        Mood and Affect: Mood normal.        Behavior: Behavior normal.  Thought Content: Thought content normal.      BP 120/82   Pulse (!) 55   Temp (!) 97.3 F (36.3 C) (Temporal)   Ht 5\' 10"  (1.778 m)   Wt 167 lb (75.8 kg)   SpO2 100%   BMI 23.96 kg/m  Wt Readings from Last 3 Encounters:  07/17/23 167 lb (75.8 kg)  07/16/23 166 lb 6.4 oz (75.5 kg)  07/03/23 169 lb (76.7 kg)       Assessment & Plan:   Problem List Items Addressed This Visit     Essential hypertension - Primary   Hypothyroidism   Other Visit Diagnoses       Elevated AST (SGOT)       Relevant Orders   US  Abdomen Limited RUQ (LIVER/GB)     Bradycardia          He is under the care of cardiology. Atenolol  was stopped. Wearing cardiac monitor.  Continues to be asymptomatic.  Chronically elevated AST. RUQ US  ordered.  Continue off of testosterone  since he is feeling the same as when he was on the medication and has been off of replacement for 4 weeks.  Follow up in 4 months or sooner if needed.   I have discontinued Haymond Leoni's testosterone  cypionate. I am also having him maintain his Vitamin D , Fish Oil, mirtazapine, clonazePAM, methylphenidate, diclofenac  Sodium, tadalafil , NEEDLE (DISP) 21 G, multivitamin, olmesartan , levothyroxine , meloxicam , allopurinol , gabapentin , OMEPRAZOLE PO, and B-D 3CC LUER-LOK SYR 21GX1".  No orders of the defined types were placed in this encounter.

## 2023-07-17 NOTE — Patient Instructions (Signed)
 You will hear from someone to schedule the ultrasound of your liver.  Let me know if you have not heard from anyone in the next 2 weeks.

## 2023-07-18 ENCOUNTER — Ambulatory Visit: Payer: Medicare PPO | Admitting: Nurse Practitioner

## 2023-07-23 ENCOUNTER — Encounter: Payer: Self-pay | Admitting: Family Medicine

## 2023-08-07 ENCOUNTER — Other Ambulatory Visit

## 2023-08-09 ENCOUNTER — Ambulatory Visit

## 2023-08-09 DIAGNOSIS — R001 Bradycardia, unspecified: Secondary | ICD-10-CM | POA: Diagnosis not present

## 2023-08-09 LAB — ECHOCARDIOGRAM COMPLETE
AR max vel: 2.01 cm2
AV Area VTI: 2.49 cm2
AV Area mean vel: 1.94 cm2
AV Mean grad: 4 mmHg
AV Peak grad: 8.1 mmHg
Ao pk vel: 1.42 m/s
Area-P 1/2: 2.18 cm2
MV M vel: 3.24 m/s
MV Peak grad: 42 mmHg
S' Lateral: 3 cm

## 2023-08-10 ENCOUNTER — Ambulatory Visit: Payer: Self-pay

## 2023-08-10 DIAGNOSIS — R001 Bradycardia, unspecified: Secondary | ICD-10-CM

## 2023-08-13 ENCOUNTER — Ambulatory Visit
Admission: RE | Admit: 2023-08-13 | Discharge: 2023-08-13 | Disposition: A | Discharge status: Home / Self Care | Source: Ambulatory Visit | Attending: Family Medicine | Admitting: Family Medicine

## 2023-08-13 ENCOUNTER — Telehealth: Payer: Self-pay

## 2023-08-13 DIAGNOSIS — R7401 Elevation of levels of liver transaminase levels: Secondary | ICD-10-CM

## 2023-08-13 NOTE — Telephone Encounter (Signed)
 Dr. Ronell Coe reviewed the patient's blood pressure log that he had turned in to the office and stated that the patient's blood pressures show he has "good blood pressure control". The patient was called and informed of Dr. Madireddy's interpretation of his blood pressure log results. Patient verbalized understanding and had no further questions at this time.

## 2023-08-20 ENCOUNTER — Telehealth: Payer: Self-pay | Admitting: Family Medicine

## 2023-08-20 NOTE — Telephone Encounter (Signed)
 Contacted Kevin Gomez to schedule their annual wellness visit. Patient declined to schedule AWV at this time. Patient states her will reach out to PCP to see what PCP would recommend. If AWV needs to be schedule have patient call me at [phone].   [facility] AWV TEAM Direct Dial: [phone]

## 2023-08-21 ENCOUNTER — Ambulatory Visit: Payer: Self-pay | Admitting: Family Medicine

## 2023-09-20 ENCOUNTER — Other Ambulatory Visit: Payer: Self-pay | Admitting: Family Medicine

## 2023-09-20 DIAGNOSIS — M1000 Idiopathic gout, unspecified site: Secondary | ICD-10-CM

## 2023-09-25 ENCOUNTER — Other Ambulatory Visit: Payer: Self-pay

## 2023-09-26 ENCOUNTER — Ambulatory Visit

## 2023-09-26 VITALS — BP 130/88 | HR 51 | Ht 69.0 in | Wt 171.6 lb

## 2023-09-26 DIAGNOSIS — I77810 Thoracic aortic ectasia: Secondary | ICD-10-CM | POA: Insufficient documentation

## 2023-09-26 DIAGNOSIS — I340 Nonrheumatic mitral (valve) insufficiency: Secondary | ICD-10-CM | POA: Insufficient documentation

## 2023-09-26 DIAGNOSIS — I10 Essential (primary) hypertension: Secondary | ICD-10-CM

## 2023-09-26 DIAGNOSIS — E782 Mixed hyperlipidemia: Secondary | ICD-10-CM

## 2023-09-26 MED ORDER — AMLODIPINE BESYLATE 2.5 MG PO TABS: 2.5000 mg | ORAL_TABLET | Freq: Every day | ORAL | 3 refills | Status: AC

## 2023-09-26 NOTE — Assessment & Plan Note (Signed)
 Near optimal control. Diastolic still remains mildly elevated. Continue olmesartan  which he is taking 20 mg once a day. Titrating up the dose due to borderline potassium levels.  Add amlodipine  2.5 mg once daily, discussed mechanism of action and potential side effects as well as lightheadedness and ankle edema.  Target blood pressure below 130/80 mmHg.

## 2023-09-26 NOTE — Assessment & Plan Note (Addendum)
 Noted size 4.1 cm on echocardiogram 08/09/2023. No other imaging to compare. Mentions brother with history of aortic aneurysm no intervention. Advised to get further details with regards to his brother's aneurysm size.  Depending on his brother's history we will consider earlier imaging with CT chest. Otherwise we will continue to follow-up with repeat echocardiogram tentatively in 1 year.  Discussed about monitoring for ascending aorta and potential interventions needed if size approaches 5 cm given the risk for rupture.

## 2023-09-26 NOTE — Patient Instructions (Signed)
 Medication Instructions:  Your physician has recommended you make the following change in your medication:   Start Amlodipine  2.5 mg daily.  *If you need a refill on your cardiac medications before your next appointment, please call your pharmacy*   Lab Work: None ordered If you have labs (blood work) drawn today and your tests are completely normal, you will receive your results only by: MyChart Message (if you have MyChart) OR A paper copy in the mail If you have any lab test that is abnormal or we need to change your treatment, we will call you to review the results.  Testing/Procedures: Your physician has requested that you have an echocardiogram in 1 year. Echocardiography is a painless test that uses sound waves to create images of your heart. It provides your doctor with information about the size and shape of your heart and how well your heart's chambers and valves are working. This procedure takes approximately one hour. There are no restrictions for this procedure. Please do NOT wear cologne, perfume, aftershave, or lotions (deodorant is allowed). Please arrive 15 minutes prior to your appointment time.  Please note: We ask at that you not bring children with you during ultrasound (echo/ vascular) testing. Due to room size and safety concerns, children are not allowed in the ultrasound rooms during exams. Our front office staff cannot provide observation of children in our lobby area while testing is being conducted. An adult accompanying a patient to their appointment will only be allowed in the ultrasound room at the discretion of the ultrasound technician under special circumstances. We apologize for any inconvenience.  Follow-Up: At [facility], you and your health needs are our priority.  As part of our continuing mission to provide you with exceptional heart care, we have created designated Provider Care Teams.  These Care Teams include your primary Cardiologist (physician)  and Advanced Practice Providers (APPs -  Physician Assistants and Nurse Practitioners) who all work together to provide you with the care you need, when you need it.  We recommend signing up for the patient portal called MyChart.  Sign up information is provided on this After Visit Summary.  MyChart is used to connect with patients for Virtual Visits (Telemedicine).  Patients are able to view lab/test results, encounter notes, upcoming appointments, etc.  Non-urgent messages can be sent to your provider as well.   To learn more about what you can do with MyChart, go to [email or web address].    Your next appointment:   12 month(s)  The format for your next appointment:   In Person  Provider:   Alean Madireddy, MD   Other Instructions Echocardiogram An echocardiogram is a test that uses sound waves (ultrasound) to produce images of the heart. Images from an echocardiogram can provide important information about: Heart size and shape. The size and thickness and movement of your heart's walls. Heart muscle function and strength. Heart valve function or if you have stenosis. Stenosis is when the heart valves are too narrow. If blood is flowing backward through the heart valves (regurgitation). A tumor or infectious growth around the heart valves. Areas of heart muscle that are not working well because of poor blood flow or injury from a heart attack. Aneurysm detection. An aneurysm is a weak or damaged part of an artery wall. The wall bulges out from the normal force of blood pumping through the body. Tell a health care provider about: Any allergies you have. All medicines you are taking, including vitamins, herbs, eye  drops, creams, and over-the-counter medicines. Any blood disorders you have. Any surgeries you have had. Any medical conditions you have. Whether you are pregnant or may be pregnant. What are the risks? Generally, this is a safe test. However, problems may occur,  including an allergic reaction to dye (contrast) that may be used during the test. What happens before the test? No specific preparation is needed. You may eat and drink normally. What happens during the test? You will take off your clothes from the waist up and put on a hospital gown. Electrodes or electrocardiogram (ECG)patches may be placed on your chest. The electrodes or patches are then connected to a device that monitors your heart rate and rhythm. You will lie down on a table for an ultrasound exam. A gel will be applied to your chest to help sound waves pass through your skin. A handheld device, called a transducer, will be pressed against your chest and moved over your heart. The transducer produces sound waves that travel to your heart and bounce back (or echo back) to the transducer. These sound waves will be captured in real-time and changed into images of your heart that can be viewed on a video monitor. The images will be recorded on a computer and reviewed by your health care provider. You may be asked to change positions or hold your breath for a short time. This makes it easier to get different views or better views of your heart. In some cases, you may receive contrast through an IV in one of your veins. This can improve the quality of the pictures from your heart. The procedure may vary among health care providers and hospitals.   What can I expect after the test? You may return to your normal, everyday life, including diet, activities, and medicines, unless your health care provider tells you not to do that. Follow these instructions at home: It is up to you to get the results of your test. Ask your health care provider, or the department that is doing the test, when your results will be ready. Keep all follow-up visits. This is important. Summary An echocardiogram is a test that uses sound waves (ultrasound) to produce images of the heart. Images from an echocardiogram can  provide important information about the size and shape of your heart, heart muscle function, heart valve function, and other possible heart problems. You do not need to do anything to prepare before this test. You may eat and drink normally. After the echocardiogram is completed, you may return to your normal, everyday life, unless your health care provider tells you not to do that. This information is not intended to replace advice given to you by your health care provider. Make sure you discuss any questions you have with your health care provider. Document Revised: 10/14/2019 Document Reviewed: 10/14/2019 Elsevier Patient Education  2021 [employer]   Important Information About Sugar

## 2023-09-26 NOTE — Assessment & Plan Note (Signed)
 Lipid panel from 01/29/2023 total cholesterol 165, HDL 33, LDL 91, triglycerides 304. Remains on fish oil supplementation. Currently not on statins. Continue with aggressive dietary and lifestyle modifications.

## 2023-09-26 NOTE — Assessment & Plan Note (Signed)
 Noted on echocardiogram 08/09/2023.  Follow-up by echocardiogram tentatively in 1 year.

## 2023-09-26 NOTE — Progress Notes (Signed)
 Cardiology Consultation:    Date:  09/26/2023   ID:  Kevin Gomez, DOB 1955/12/24, MRN [id number]  PCP:  Kevin Boby CROME, NP-C  Cardiologist:  Kevin JONELLE Kobus, MD   Referring MD: Kevin Boby CROME, NP-C   Chief Complaint  Patient presents with   Results     ASSESSMENT AND PLAN:   [name] 68 year old male with history of hypertension, glaucoma, ADD, vitamin D  deficiency, hypothyroidism, hypertension, low testosterone , sinus bradycardia and right bundle branch block, relatively slow heart rates discontinued atenolol  in May 2025, mild MR and mildly dilated ascending aorta 4.1 cm on echocardiogram 08/09/2023, gives family history of older brother with ascending aortic aneurysm.  Problem List Items Addressed This Visit     Essential hypertension - Primary   Near optimal control. Diastolic still remains mildly elevated. Continue olmesartan  which he is taking 20 mg once a day. Titrating up the dose due to borderline potassium levels.  Add amlodipine  2.5 mg once daily, discussed mechanism of action and potential side effects as well as lightheadedness and ankle edema.  Target blood pressure below 130/80 mmHg.       Relevant Medications   amLODipine  (NORVASC ) 2.5 MG tablet   Hyperlipidemia, mixed   Lipid panel from 01/29/2023 total cholesterol 165, HDL 33, LDL 91, triglycerides 304. Remains on fish oil supplementation. Currently not on statins. Continue with aggressive dietary and lifestyle modifications.       Relevant Medications   amLODipine  (NORVASC ) 2.5 MG tablet   Mild mitral regurgitation   Noted on echocardiogram 08/09/2023.  Follow-up by echocardiogram tentatively in 1 year.       Relevant Medications   amLODipine  (NORVASC ) 2.5 MG tablet   Mild ascending aorta dilatation (HCC)   Noted size 4.1 cm on echocardiogram 08/09/2023. No other imaging to compare. Mentions brother with history of aortic aneurysm no intervention. Advised to get further details  with regards to his brother's aneurysm size.  Depending on his brother's history we will consider earlier imaging with CT chest. Otherwise we will continue to follow-up with repeat echocardiogram tentatively in 1 year.  Discussed about monitoring for ascending aorta and potential interventions needed if size approaches 5 cm given the risk for rupture.        Relevant Medications   amLODipine  (NORVASC ) 2.5 MG tablet  Return to clinic tentatively in 1 year.  History of Present Illness:    Kevin Gomez is a 68 y.o. male who is being seen today for follow-up visit. PCP is Henson, Vickie L, NP-C. Last visit with us  in the office was 07/16/2023.  history of hypertension, glaucoma, ADD, vitamin D  deficiency, hypothyroidism, hypertension, low testosterone , sinus bradycardia and right bundle branch block on recent follow-up with PCP in the setting of ongoing use with atenolol  50 mg once daily, dose was lowered to 25 mg and referred for further evaluation  Atenolol  was discontinued at last office visit. Heart monitor for 7 days 07/16/2023 noted average heart rate 65/min ranging from 37 to 148 bpm with the slowest heart rate sinus bradycardia 37/min during sleeping hours.  Rare ventricular and supraventricular ectopy.  4 short runs of SVT noted with the longest episode lasting 7.5 seconds without any symptoms.  No high-grade conduction abnormalities or pauses noted.  This was reassuring.  Echocardiogram from 08/09/2023 noted normal biventricular function EF 60 to 65%, grade 1 diastolic dysfunction, normal RV size and function, mild MR.  Ascending aorta size reported 4.1 cm  Mentions has been doing well.  [place] with [place] and did a lot of hiking and had no symptoms.  Denies any lightheadedness, dizziness or syncopal episodes. No other cardiac symptoms. Mentions feet towards his toes may appear bluish at times denies any pain, ulcers, swelling. Normal pulses on physical  exam with dorsalis pedis 1+ symmetric.  Uses methylphenidate for his ADD symptoms up to 40 mg daily since he retired.  Past Medical History:  Diagnosis Date   Abnormal glucose    ADD (attention deficit disorder)    Aortic atherosclerosis (HCC) by T-Spine Xray on 08/26/2020 08/28/2020   BMI 25.0-25.9,adult 01/26/2015   Bradycardia with 31-40 beats per minute 07/03/2023   Essential hypertension    Gout 03/19/2014   Hereditary spherocytosis (HCC)    Hyperlipidemia    Hyperlipidemia, mixed 01/23/2013   Hypertension    Hypothyroidism 04/14/2019   Medication management 03/19/2014   Right bundle branch block 07/16/2023   Testosterone  Deficiency 05/09/2013   Testosterone  deficiency 07/03/2023   Vitamin D  deficiency     Past Surgical History:  Procedure Laterality Date   FRACTURE SURGERY     ORTHOPEDIC SURGERY Left    left foot pinning   ORTHOPEDIC SURGERY Left    left foot pinning   ORTHOPEDIC SURGERY Right    right elbow bursectomy  1984   ORTHOPEDIC SURGERY Right 2002 dr supple   right rotator cuff    Current Medications: Current Meds  Medication Sig   allopurinol  (ZYLOPRIM ) 300 MG tablet TAKE 1/2 TABLET DAILY TO PREVENT GOUT   amLODipine  (NORVASC ) 2.5 MG tablet Take 1 tablet (2.5 mg total) by mouth daily.   Cholecalciferol (VITAMIN D ) 2000 UNITS tablet Take 2,000 Units by mouth 4 (four) times daily.    clonazePAM (KLONOPIN) 1 MG tablet Take 1 mg by mouth 2 (two) times daily as needed. Takes up to 2 tabs daily prn (Patient taking differently: Take 1 mg by mouth daily. Takes up to 2 tabs daily prn)   diclofenac  Sodium (VOLTAREN ) 1 % GEL APPLY 2 TO 4 GRAMS TOPICALLY TO PAINFUL JOINTS 2 TO 4 X /DAY   gabapentin  (NEURONTIN ) 800 MG tablet TAKE 6 TABLETS DAILY FOR CHRONIC PAIN   levothyroxine  (SYNTHROID ) 25 MCG tablet Take 1 tablet (25 mcg total) by mouth daily before breakfast.   meloxicam  (MOBIC ) 15 MG tablet TAKE 1/2 TO 1 TABLET DAILY WITH FOOD AS NEEDED FOR PAIN & INFLAMMATION    methylphenidate (METADATE CD) 20 MG CR capsule Take 60 mg by mouth daily.   mirtazapine (REMERON) 45 MG tablet Take 45 mg by mouth at bedtime.   Multiple Vitamin (MULTIVITAMIN) tablet Take 1 tablet by mouth daily.   NEEDLE, DISP, 21 G 21G X 1 MISC Use for testosterone  injections, 3 ml   olmesartan  (BENICAR ) 40 MG tablet Take 1/2 to 1 tablet  at Night  for BP   Omega-3 Fatty Acids (FISH OIL) 1000 MG CAPS Take 1,000 mg by mouth daily.   OMEPRAZOLE PO Take 1 tablet by mouth as needed (heartburn or indigestion).   SYRINGE-NEEDLE, DISP, 3 ML (B-D 3CC LUER-LOK SYR 21GX1) 21G X 1 3 ML MISC Use to inject testosterone  weekly   tadalafil  (CIALIS ) 20 MG tablet Take  1/2 to 1 tablet  every 2 to 3 days  as needed for  XXXX     Allergies:   Serzone [nefazodone] and Trazamine [trazodone & diet manage prod]   Social History   Socioeconomic History   Marital status: Married    Spouse name: Not  on file   Number of children: Not on file   Years of education: Not on file   Highest education level: Not on file  Occupational History   Not on file  Tobacco Use   Smoking status: Never   Smokeless tobacco: Never  Substance and Sexual Activity   Alcohol use: No    Alcohol/week: 0.0 standard drinks of alcohol   Drug use: Not on file   Sexual activity: Not on file  Other Topics Concern   Not on file  Social History Narrative   Not on file   Social Drivers of Health   Financial Resource Strain: Not on file  Food Insecurity: Not on file  Transportation Needs: Not on file  Physical Activity: Not on file  Stress: Not on file  Social Connections: Not on file     Family History: The patient's family history includes Aortic stenosis in his father; Cancer in his mother; Hypertension in his father. ROS:   Please see the history of present illness.    All 14 point review of systems negative except as described per history of present illness.  EKGs/Labs/Other Studies Reviewed:    The following  studies were reviewed today:   EKG:       Recent Labs: 07/03/2023: ALT 44; BUN 21; Creatinine, Ser 1.21; Hemoglobin 13.3; Magnesium 2.0; Platelets 154.0; Potassium 4.8; Sodium 136; TSH 1.71  Recent Lipid Panel    Component Value Date/Time   CHOL 165 01/29/2023 1032   TRIG 304 (H) 01/29/2023 1032   HDL 33 (Gomez) 01/29/2023 1032   CHOLHDL 5.0 (H) 01/29/2023 1032   VLDL 73 (H) 08/30/2016 1004   LDLCALC 91 01/29/2023 1032    Physical Exam:    VS:  BP 130/88 (BP Location: Left Arm, Patient Position: Sitting)   Pulse (!) 51   Ht 5' 9 (1.753 m)   Wt 171 lb 9.6 oz (77.8 kg)   SpO2 98%   BMI 25.34 kg/m     Wt Readings from Last 3 Encounters:  09/26/23 171 lb 9.6 oz (77.8 kg)  07/17/23 167 lb (75.8 kg)  07/16/23 166 lb 6.4 oz (75.5 kg)     GENERAL:  Well nourished, well developed in no acute distress NECK: No JVD; No carotid bruits CARDIAC: RRR, S1 and S2 present, no murmurs, no rubs, no gallops CHEST:  Clear to auscultation without rales, wheezing or rhonchi  Extremities: No pitting pedal edema. Pulses bilaterally symmetric with radial 2+ and dorsalis pedis 2+ NEUROLOGIC:  Alert and oriented x 3  Medication Adjustments/Labs and Tests Ordered: Current medicines are reviewed at length with the patient today.  Concerns regarding medicines are outlined above.  No orders of the defined types were placed in this encounter.  Meds ordered this encounter  Medications   amLODipine  (NORVASC ) 2.5 MG tablet    Sig: Take 1 tablet (2.5 mg total) by mouth daily.    Dispense:  180 tablet    Refill:  3    Signed, Leslie Langille reddy Lita Flynn, MD, MPH, [facility]. 09/26/2023 9:06 AM    [facility]

## 2023-10-30 ENCOUNTER — Other Ambulatory Visit: Payer: Self-pay | Admitting: Family Medicine

## 2023-11-22 ENCOUNTER — Ambulatory Visit: Payer: Self-pay | Admitting: Family Medicine

## 2023-11-22 ENCOUNTER — Encounter: Payer: Self-pay | Admitting: Family Medicine

## 2023-11-22 ENCOUNTER — Ambulatory Visit: Admitting: Family Medicine

## 2023-11-22 VITALS — BP 102/78 | HR 45 | Temp 97.9°F | Ht 69.0 in | Wt 167.0 lb

## 2023-11-22 DIAGNOSIS — E039 Hypothyroidism, unspecified: Secondary | ICD-10-CM | POA: Diagnosis not present

## 2023-11-22 DIAGNOSIS — E349 Endocrine disorder, unspecified: Secondary | ICD-10-CM | POA: Diagnosis not present

## 2023-11-22 DIAGNOSIS — R7401 Elevation of levels of liver transaminase levels: Secondary | ICD-10-CM

## 2023-11-22 DIAGNOSIS — I10 Essential (primary) hypertension: Secondary | ICD-10-CM

## 2023-11-22 DIAGNOSIS — K760 Fatty (change of) liver, not elsewhere classified: Secondary | ICD-10-CM | POA: Diagnosis not present

## 2023-11-22 DIAGNOSIS — E559 Vitamin D deficiency, unspecified: Secondary | ICD-10-CM | POA: Diagnosis not present

## 2023-11-22 DIAGNOSIS — M1000 Idiopathic gout, unspecified site: Secondary | ICD-10-CM | POA: Diagnosis not present

## 2023-11-22 DIAGNOSIS — R001 Bradycardia, unspecified: Secondary | ICD-10-CM

## 2023-11-22 DIAGNOSIS — E782 Mixed hyperlipidemia: Secondary | ICD-10-CM

## 2023-11-22 DIAGNOSIS — E871 Hypo-osmolality and hyponatremia: Secondary | ICD-10-CM

## 2023-11-22 LAB — HEPATIC FUNCTION PANEL
ALT: 34 U/L (ref 0–53)
AST: 49 U/L — ABNORMAL HIGH (ref 0–37)
Albumin: 4.6 g/dL (ref 3.5–5.2)
Alkaline Phosphatase: 98 U/L (ref 39–117)
Bilirubin, Direct: 0.2 mg/dL (ref 0.0–0.3)
Total Bilirubin: 1.1 mg/dL (ref 0.2–1.2)
Total Protein: 6.7 g/dL (ref 6.0–8.3)

## 2023-11-22 LAB — CBC WITH DIFFERENTIAL/PLATELET
Basophils Absolute: 0 K/uL (ref 0.0–0.1)
Basophils Relative: 0.3 % (ref 0.0–3.0)
Eosinophils Absolute: 0.1 K/uL (ref 0.0–0.7)
Eosinophils Relative: 0.7 % (ref 0.0–5.0)
HCT: 40.8 % (ref 39.0–52.0)
Hemoglobin: 12.5 g/dL — ABNORMAL LOW (ref 13.0–17.0)
Lymphocytes Relative: 21.1 % (ref 12.0–46.0)
Lymphs Abs: 1.7 K/uL (ref 0.7–4.0)
MCHC: 30.7 g/dL (ref 30.0–36.0)
MCV: 58.5 fl — ABNORMAL LOW (ref 78.0–100.0)
Monocytes Absolute: 0.4 K/uL (ref 0.1–1.0)
Monocytes Relative: 5.6 % (ref 3.0–12.0)
Neutro Abs: 5.7 K/uL (ref 1.4–7.7)
Neutrophils Relative %: 72.3 % (ref 43.0–77.0)
Platelets: 142 K/uL — ABNORMAL LOW (ref 150.0–400.0)
RBC: 6.98 Mil/uL — ABNORMAL HIGH (ref 4.22–5.81)
RDW: 18.9 % — ABNORMAL HIGH (ref 11.5–15.5)
WBC: 7.9 K/uL (ref 4.0–10.5)

## 2023-11-22 LAB — BASIC METABOLIC PANEL WITH GFR
BUN: 16 mg/dL (ref 6–23)
CO2: 27 meq/L (ref 19–32)
Calcium: 9.4 mg/dL (ref 8.4–10.5)
Chloride: 95 meq/L — ABNORMAL LOW (ref 96–112)
Creatinine, Ser: 1.09 mg/dL (ref 0.40–1.50)
GFR: 70.14 mL/min (ref >60.00)
Glucose, Bld: 96 mg/dL (ref 70–99)
Potassium: 4.1 meq/L (ref 3.5–5.1)
Sodium: 128 meq/L — ABNORMAL LOW (ref 135–145)

## 2023-11-22 LAB — LIPID PANEL
Cholesterol: 156 mg/dL (ref 0–200)
HDL: 35.1 mg/dL — ABNORMAL LOW (ref >39.00)
LDL Cholesterol: 88 mg/dL (ref 0–99)
NonHDL: 120.86
Total CHOL/HDL Ratio: 4
Triglycerides: 166 mg/dL — ABNORMAL HIGH (ref 0.0–149.0)
VLDL: 33.2 mg/dL (ref 0.0–40.0)

## 2023-11-22 LAB — T4, FREE: Free T4: 0.98 ng/dL (ref 0.60–1.60)

## 2023-11-22 LAB — VITAMIN D 25 HYDROXY (VIT D DEFICIENCY, FRACTURES): VITD: 81.42 ng/mL (ref 30.00–100.00)

## 2023-11-22 LAB — URIC ACID: Uric Acid, Serum: 4.9 mg/dL (ref 4.0–7.8)

## 2023-11-22 LAB — TSH: TSH: 2.28 u[IU]/mL (ref 0.35–5.50)

## 2023-11-22 NOTE — Progress Notes (Signed)
 Subjective:     Patient ID: Kevin Gomez, male    DOB: 1956-02-19, 68 y.o.   MRN: [id number]  Chief Complaint  Patient presents with   Medical Management of Chronic Issues    4 month f/u    HPI  Discussed the use of AI scribe software for clinical note transcription with the patient, who gave verbal consent to proceed.  History of Present Illness Kevin Gomez is a 68 year old male who presents for follow-up on elevated AST and fatty liver.  Elevated hepatic transaminases and hepatic steatosis - Chronically elevated AST levels - Recent ultrasound demonstrates fatty liver - No alcohol use - Discontinued meloxicam  to assess impact on liver function  Gout prophylaxis - Takes allopurinol  150 mg daily for prophylaxis - No gout flare-ups in over a dozen years  Hypertension - Managed with low dose amlodipine  - Blood pressure well controlled with regular monitoring  Bradycardia - Monitored by cardiologist - Previous cardiac monitoring was normal  Musculoskeletal pain and stiffness - Joint pain in shoulders, arms, wrists, and lower back attributed to arthritis and scoliosis - Morning soreness improves with activity  Restless legs and jaw pain - Gabapentin  used for management of jaw pain and restless leg symptoms  Thyroid  medication discontinuation - Discontinued thyroid  medication seven weeks ago - No significant changes in symptoms since discontinuation  Physical activity - Walks approximately twelve miles per week  History of herpes zoster - Experienced mild case of shingles last year without pain or itching  Notices decreases libido since being off testosterone     Health Maintenance Due  Topic Date Due   DTaP/Tdap/Td (2 - Tdap) 03/24/2022   Medicare Annual Wellness (AWV)  07/18/2023    Past Medical History:  Diagnosis Date   Abnormal glucose    ADD (attention deficit disorder)    Aortic atherosclerosis (HCC) by T-Spine Xray on 08/26/2020 08/28/2020    BMI 25.0-25.9,adult 01/26/2015   Bradycardia with 31-40 beats per minute 07/03/2023   Essential hypertension    Gout 03/19/2014   Hereditary spherocytosis (HCC)    Hyperlipidemia    Hyperlipidemia, mixed 01/23/2013   Hypertension    Hypothyroidism 04/14/2019   Medication management 03/19/2014   Right bundle branch block 07/16/2023   Testosterone  Deficiency 05/09/2013   Testosterone  deficiency 07/03/2023   Vitamin D  deficiency     Past Surgical History:  Procedure Laterality Date   FRACTURE SURGERY     ORTHOPEDIC SURGERY Left    left foot pinning   ORTHOPEDIC SURGERY Left    left foot pinning   ORTHOPEDIC SURGERY Right    right elbow bursectomy  1984   ORTHOPEDIC SURGERY Right 2002 dr supple   right rotator cuff    Family History  Problem Relation Age of Onset   Cancer Mother        melonoma   Hypertension Father    Aortic stenosis Father     Social History   Socioeconomic History   Marital status: Married    Spouse name: Not on file   Number of children: Not on file   Years of education: Not on file   Highest education level: Not on file  Occupational History   Not on file  Tobacco Use   Smoking status: Never   Smokeless tobacco: Never  Substance and Sexual Activity   Alcohol use: No    Alcohol/week: 0.0 standard drinks of alcohol   Drug use: Not on file   Sexual activity: Not on file  Other Topics  Concern   Not on file  Social History Narrative   Not on file   Social Drivers of Health   Financial Resource Strain: Not on file  Food Insecurity: Not on file  Transportation Needs: Not on file  Physical Activity: Not on file  Stress: Not on file  Social Connections: Not on file  Intimate Partner Violence: Not on file    Outpatient Medications Prior to Visit  Medication Sig Dispense Refill   allopurinol  (ZYLOPRIM ) 300 MG tablet TAKE 1/2 TABLET DAILY TO PREVENT GOUT 45 tablet 3   amLODipine  (NORVASC ) 2.5 MG tablet Take 1 tablet (2.5 mg total) by  mouth daily. 180 tablet 3   Cholecalciferol (VITAMIN D ) 2000 UNITS tablet Take 2,000 Units by mouth 4 (four) times daily.      clonazePAM (KLONOPIN) 1 MG tablet Take 1 mg by mouth 2 (two) times daily as needed. Takes up to 2 tabs daily prn     gabapentin  (NEURONTIN ) 800 MG tablet TAKE 6 TABLETS DAILY FOR CHRONIC PAIN 540 tablet 3   methylphenidate (METADATE CD) 20 MG CR capsule Take 60 mg by mouth daily.     mirtazapine (REMERON) 45 MG tablet Take 45 mg by mouth at bedtime.     Multiple Vitamin (MULTIVITAMIN) tablet Take 1 tablet by mouth daily.     NEEDLE, DISP, 21 G 21G X 1 MISC Use for testosterone  injections, 3 ml 50 each 1   olmesartan  (BENICAR ) 40 MG tablet Take 1/2 to 1 tablet  at Night  for BP 90 tablet 3   Omega-3 Fatty Acids (FISH OIL) 1000 MG CAPS Take 1,000 mg by mouth daily.     SYRINGE-NEEDLE, DISP, 3 ML (B-D 3CC LUER-LOK SYR 21GX1) 21G X 1 3 ML MISC Use to inject testosterone  weekly 100 each 0   diclofenac  Sodium (VOLTAREN ) 1 % GEL APPLY 2 TO 4 GRAMS TOPICALLY TO PAINFUL JOINTS 2 TO 4 X /DAY 100 g 3   levothyroxine  (SYNTHROID ) 25 MCG tablet Take 1 tablet (25 mcg total) by mouth daily before breakfast. 30 tablet 0   meloxicam  (MOBIC ) 15 MG tablet TAKE 1/2 TO 1 TABLET DAILY WITH FOOD AS NEEDED FOR PAIN & INFLAMMATION 90 tablet 3   OMEPRAZOLE PO Take 1 tablet by mouth as needed (heartburn or indigestion).     tadalafil  (CIALIS ) 20 MG tablet Take  1/2 to 1 tablet  every 2 to 3 days  as needed for  XXXX 30 tablet 1   No facility-administered medications prior to visit.    Allergies  Allergen Reactions   Serzone [Nefazodone]     Leg cramps   Trazamine [Trazodone & Diet Manage Prod] Other (See Comments)    confusion    Review of Systems  Constitutional:  Negative for chills, fever and malaise/fatigue.  HENT:  Negative for congestion.   Eyes:  Negative for blurred vision, double vision and pain.  Respiratory:  Negative for shortness of breath.   Cardiovascular:  Negative  for chest pain, palpitations and leg swelling.  Gastrointestinal:  Negative for abdominal pain, constipation, diarrhea, nausea and vomiting.  Genitourinary:  Negative for dysuria, frequency and urgency.  Musculoskeletal:  Positive for back pain.  Neurological:  Negative for dizziness, focal weakness and headaches.  Psychiatric/Behavioral:  Negative for depression. The patient is not nervous/anxious.        Objective:    Physical Exam Constitutional:      General: He is not in acute distress.    Appearance: He is not ill-appearing.  HENT:     Mouth/Throat:     Mouth: Mucous membranes are moist.     Pharynx: Oropharynx is clear.  Eyes:     Extraocular Movements: Extraocular movements intact.     Conjunctiva/sclera: Conjunctivae normal.     Pupils: Pupils are equal, round, and reactive to light.  Cardiovascular:     Rate and Rhythm: Regular rhythm. Bradycardia present.  Pulmonary:     Effort: Pulmonary effort is normal.     Breath sounds: Normal breath sounds.  Musculoskeletal:     Cervical back: Normal range of motion and neck supple. No tenderness.     Right lower leg: No edema.     Left lower leg: No edema.  Lymphadenopathy:     Cervical: No cervical adenopathy.  Skin:    General: Skin is warm and dry.  Neurological:     General: No focal deficit present.     Mental Status: He is alert and oriented to person, place, and time.     Coordination: Coordination normal.     Gait: Gait normal.  Psychiatric:        Mood and Affect: Mood normal.        Behavior: Behavior normal.        Thought Content: Thought content normal.      BP 102/78   Pulse (!) 45   Temp 97.9 F (36.6 C) (Temporal)   Ht 5' 9 (1.753 m)   Wt 167 lb (75.8 kg)   SpO2 98%   BMI 24.66 kg/m  Wt Readings from Last 3 Encounters:  11/22/23 167 lb (75.8 kg)  09/26/23 171 lb 9.6 oz (77.8 kg)  07/17/23 167 lb (75.8 kg)       Assessment & Plan:   Problem List Items Addressed This Visit      Essential hypertension - Primary   Relevant Orders   CBC with Differential/Platelet (Completed)   Basic metabolic panel with GFR (Completed)   Gout   Relevant Orders   Uric acid (Completed)   Hyperlipidemia, mixed   Relevant Orders   Lipid panel (Completed)   Hypothyroidism   Relevant Orders   TSH (Completed)   T4, free (Completed)   Testosterone  deficiency   Relevant Orders   Testosterone  , Free and Total   Vitamin D  deficiency   Relevant Orders   VITAMIN D  25 Hydroxy (Vit-D Deficiency, Fractures) (Completed)   Other Visit Diagnoses       Hepatic steatosis       Relevant Orders   Basic metabolic panel with GFR (Completed)   Hepatic function panel (Completed)     Elevated AST (SGOT)       Relevant Orders   Basic metabolic panel with GFR (Completed)   Hepatic function panel (Completed)     Bradycardia           Assessment and Plan Assessment & Plan Fatty liver disease with chronically elevated liver transaminases Ultrasound confirmed fatty liver. No alcohol consumption. Previously on meloxicam  for joint pain, which has been discontinued to protect liver function. - Recheck liver function tests to assess current status.  Essential hypertension and bradycardia Hypertension is well-controlled with current medication regimen. Bradycardia is asymptomatic and under cardiology care. Recent cardiac monitoring showed no concerning findings. Blood pressure is stable with the addition of amlodipine .  Hypothyroidism Previously on levothyroxine  25 mcg daily. Medication was stopped to assess thyroid  function without it. No significant symptoms reported since stopping medication. - Check TSH levels to determine if levothyroxine  needs  to be restarted.  Chronic back pain with scoliosis and arthralgia Chronic back pain attributed to scoliosis and arthralgia. Previously on meloxicam , which has been discontinued. Pain is present upon waking but improves with activity. Engages in regular  walking for exercise.  Restless legs symptoms Gabapentin  is being used to manage restless leg symptoms, with timing adjusted to improve efficacy before bedtime.  Gout No gout flare-ups reported in over a dozen years. Continues on allopurinol  150 mg for prophylaxis. Periodic monitoring of uric acid levels and kidney function is recommended. - Check uric acid levels. - Check kidney function.     I have discontinued Brayten Pheasant's diclofenac  Sodium, tadalafil , levothyroxine , meloxicam , and OMEPRAZOLE PO. I am also having him maintain his Vitamin D , Fish Oil, mirtazapine, clonazePAM, methylphenidate, NEEDLE (DISP) 21 G, multivitamin, olmesartan , gabapentin , B-D 3CC LUER-LOK SYR 21GX1, allopurinol , and amLODipine .  No orders of the defined types were placed in this encounter.

## 2023-11-22 NOTE — Progress Notes (Signed)
 Please reach out to him and find out how much water he drinks each day on average. His sodium was low today. This can be due to being too well hydrated. I would like to recheck his sodium on Monday.  This will be a lab visit for a BMP due to hyponatremia.  Please place this order

## 2023-11-26 ENCOUNTER — Ambulatory Visit: Payer: Self-pay | Admitting: Family Medicine

## 2023-11-26 ENCOUNTER — Other Ambulatory Visit (INDEPENDENT_AMBULATORY_CARE_PROVIDER_SITE_OTHER)

## 2023-11-26 DIAGNOSIS — E871 Hypo-osmolality and hyponatremia: Secondary | ICD-10-CM | POA: Diagnosis not present

## 2023-11-26 LAB — BASIC METABOLIC PANEL WITH GFR
BUN: 13 mg/dL (ref 6–23)
CO2: 28 meq/L (ref 19–32)
Calcium: 9.9 mg/dL (ref 8.4–10.5)
Chloride: 100 meq/L (ref 96–112)
Creatinine, Ser: 1.1 mg/dL (ref 0.40–1.50)
GFR: 69.37 mL/min (ref >60.00)
Glucose, Bld: 71 mg/dL (ref 70–99)
Potassium: 3.7 meq/L (ref 3.5–5.1)
Sodium: 138 meq/L (ref 135–145)

## 2023-11-27 NOTE — Progress Notes (Signed)
 Please let him know that I would like to work up his mild anemia.  Is he willing to come back in in approximately 4 to 6 weeks to recheck his blood counts and do more testing to find the underlying cause for this?

## 2023-11-28 LAB — TESTOSTERONE, FREE & TOTAL
Free Testosterone: 44.3 pg/mL (ref 35.0–155.0)
Testosterone, Total, LC-MS-MS: 307 ng/dL (ref 250–1100)

## 2024-01-02 ENCOUNTER — Other Ambulatory Visit: Payer: Self-pay

## 2024-01-02 DIAGNOSIS — I10 Essential (primary) hypertension: Secondary | ICD-10-CM

## 2024-01-22 DIAGNOSIS — F3341 Major depressive disorder, recurrent, in partial remission: Secondary | ICD-10-CM | POA: Diagnosis not present

## 2024-01-22 DIAGNOSIS — F410 Panic disorder [episodic paroxysmal anxiety] without agoraphobia: Secondary | ICD-10-CM | POA: Diagnosis not present

## 2024-01-22 DIAGNOSIS — F419 Anxiety disorder, unspecified: Secondary | ICD-10-CM | POA: Diagnosis not present

## 2024-01-22 DIAGNOSIS — F900 Attention-deficit hyperactivity disorder, predominantly inattentive type: Secondary | ICD-10-CM | POA: Diagnosis not present

## 2024-01-22 DIAGNOSIS — Z5181 Encounter for therapeutic drug level monitoring: Secondary | ICD-10-CM | POA: Diagnosis not present

## 2024-03-14 ENCOUNTER — Ambulatory Visit: Admitting: Family Medicine

## 2024-03-14 ENCOUNTER — Encounter: Payer: Self-pay | Admitting: Family Medicine

## 2024-03-14 VITALS — BP 100/68 | HR 41 | Temp 97.8°F | Ht 69.0 in | Wt 169.0 lb

## 2024-03-14 DIAGNOSIS — D649 Anemia, unspecified: Secondary | ICD-10-CM

## 2024-03-14 DIAGNOSIS — R001 Bradycardia, unspecified: Secondary | ICD-10-CM | POA: Diagnosis not present

## 2024-03-14 DIAGNOSIS — Z1211 Encounter for screening for malignant neoplasm of colon: Secondary | ICD-10-CM

## 2024-03-14 DIAGNOSIS — R7401 Elevation of levels of liver transaminase levels: Secondary | ICD-10-CM | POA: Diagnosis not present

## 2024-03-14 DIAGNOSIS — D696 Thrombocytopenia, unspecified: Secondary | ICD-10-CM | POA: Diagnosis not present

## 2024-03-14 DIAGNOSIS — E039 Hypothyroidism, unspecified: Secondary | ICD-10-CM | POA: Diagnosis not present

## 2024-03-14 LAB — CBC WITH DIFFERENTIAL/PLATELET
Basophils Absolute: 0 K/uL (ref 0.0–0.1)
Basophils Relative: 0.6 % (ref 0.0–3.0)
Eosinophils Absolute: 0.2 K/uL (ref 0.0–0.7)
Eosinophils Relative: 2.6 % (ref 0.0–5.0)
HCT: 38.2 % — ABNORMAL LOW (ref 39.0–52.0)
Hemoglobin: 12.1 g/dL — ABNORMAL LOW (ref 13.0–17.0)
Lymphocytes Relative: 36.1 % (ref 12.0–46.0)
Lymphs Abs: 2.4 K/uL (ref 0.7–4.0)
MCHC: 31.5 g/dL (ref 30.0–36.0)
MCV: 60.7 fl — ABNORMAL LOW (ref 78.0–100.0)
Monocytes Absolute: 0.5 K/uL (ref 0.1–1.0)
Monocytes Relative: 7.5 % (ref 3.0–12.0)
Neutro Abs: 3.5 K/uL (ref 1.4–7.7)
Neutrophils Relative %: 53.2 % (ref 43.0–77.0)
Platelets: 138 K/uL — ABNORMAL LOW (ref 150.0–400.0)
RBC: 6.31 Mil/uL — ABNORMAL HIGH (ref 4.22–5.81)
RDW: 18.2 % — ABNORMAL HIGH (ref 11.5–15.5)
WBC: 6.6 K/uL (ref 4.0–10.5)

## 2024-03-14 LAB — HEPATIC FUNCTION PANEL
ALT: 36 U/L (ref 3–53)
AST: 60 U/L — ABNORMAL HIGH (ref 5–37)
Albumin: 4.5 g/dL (ref 3.5–5.2)
Alkaline Phosphatase: 99 U/L (ref 39–117)
Bilirubin, Direct: 0.1 mg/dL (ref 0.1–0.3)
Total Bilirubin: 0.9 mg/dL (ref 0.2–1.2)
Total Protein: 6.6 g/dL (ref 6.0–8.3)

## 2024-03-14 LAB — T4, FREE: Free T4: 0.99 ng/dL (ref 0.60–1.60)

## 2024-03-14 LAB — BASIC METABOLIC PANEL WITH GFR
BUN: 23 mg/dL (ref 6–23)
CO2: 30 meq/L (ref 19–32)
Calcium: 9.5 mg/dL (ref 8.4–10.5)
Chloride: 100 meq/L (ref 96–112)
Creatinine, Ser: 1.18 mg/dL (ref 0.40–1.50)
GFR: 63.63 mL/min
Glucose, Bld: 99 mg/dL (ref 70–99)
Potassium: 4.3 meq/L (ref 3.5–5.1)
Sodium: 137 meq/L (ref 135–145)

## 2024-03-14 LAB — FERRITIN: Ferritin: 65.7 ng/mL (ref 22.0–322.0)

## 2024-03-14 LAB — TSH: TSH: 4.24 u[IU]/mL (ref 0.35–5.50)

## 2024-03-14 LAB — VITAMIN B12: Vitamin B-12: 527 pg/mL (ref 211–911)

## 2024-03-14 LAB — FOLATE: Folate: >23.7 ng/mL

## 2024-03-14 MED ORDER — GABAPENTIN 800 MG PO TABS: ORAL_TABLET | ORAL | 3 refills | Status: AC

## 2024-03-14 NOTE — Patient Instructions (Signed)
Please go downstairs for labs before you leave.

## 2024-03-14 NOTE — Progress Notes (Signed)
 "  Subjective:     Patient ID: Kevin Gomez, male    DOB: 08-02-1955, 69 y.o.   MRN: [id number]  Chief Complaint  Patient presents with   Medical Management of Chronic Issues    3 month f/u    HPI  Discussed the use of AI scribe software for clinical note transcription with the patient, who gave verbal consent to proceed.  History of Present Illness Kevin Gomez is a 69 year old male who presents for follow-up on mild anemia.  Anemia - Mild anemia under follow-up. - Donates blood approximately every two months; last donation six days ago. - Started regular iron supplementation after last visit. - No blood in stool or urine. - No dizziness or fatigue. - Good energy level; walks two to three miles daily without dyspnea or fatigue.  Thrombocytopenia and bradycardia - Chronically low platelet counts. - Low resting pulse rates, sometimes in the forties, but asymptomatic. - No longer taking atenolol , medication discontinued by cardiology - At home, pulse usually in the fifties when taking Ritalin and drinking coffee.  Hepatic steatosis and hypertriglyceridemia - fatty liver identified on prior imaging. - No alcohol use. - Regular exercise regimen. - History of elevated triglycerides, believed to be genetic. - Triglycerides improved with regular walking and omega-3 supplementation.  Thyroid  function - Discontinued thyroid  medication since late last spring. - Thyroid  levels have remained normal since discontinuation.  Colorectal cancer screening - No family history of colon cancer. - Colonoscopy in 2014 was normal. - Cologuard test approximately three years ago was negative.     Health Maintenance Due  Topic Date Due   DTaP/Tdap/Td (2 - Tdap) 03/24/2022   Medicare Annual Wellness (AWV)  07/18/2023   Fecal DNA (Cologuard)  03/10/2024    Past Medical History:  Diagnosis Date   Abnormal glucose    ADD (attention deficit disorder)    Aortic atherosclerosis (HCC)  by T-Spine Xray on 08/26/2020 08/28/2020   BMI 25.0-25.9,adult 01/26/2015   Bradycardia with 31-40 beats per minute 07/03/2023   Essential hypertension    Gout 03/19/2014   Hereditary spherocytosis    Hyperlipidemia    Hyperlipidemia, mixed 01/23/2013   Hypertension    Hypothyroidism 04/14/2019   Medication management 03/19/2014   Right bundle branch block 07/16/2023   Testosterone  Deficiency 05/09/2013   Testosterone  deficiency 07/03/2023   Vitamin D  deficiency     Past Surgical History:  Procedure Laterality Date   FRACTURE SURGERY     ORTHOPEDIC SURGERY Left    left foot pinning   ORTHOPEDIC SURGERY Left    left foot pinning   ORTHOPEDIC SURGERY Right    right elbow bursectomy  1984   ORTHOPEDIC SURGERY Right 2002 dr supple   right rotator cuff    Family History  Problem Relation Age of Onset   Cancer Mother        melonoma   Hypertension Father    Aortic stenosis Father     Social History   Socioeconomic History   Marital status: Married    Spouse name: Not on file   Number of children: Not on file   Years of education: Not on file   Highest education level: Not on file  Occupational History   Not on file  Tobacco Use   Smoking status: Never   Smokeless tobacco: Never  Substance and Sexual Activity   Alcohol use: No    Alcohol/week: 0.0 standard drinks of alcohol   Drug use: Not on file   Sexual  activity: Not on file  Other Topics Concern   Not on file  Social History Narrative   Not on file   Social Drivers of Health   Tobacco Use: Low Risk (03/14/2024)   Patient History    Smoking Tobacco Use: Never    Smokeless Tobacco Use: Never    Passive Exposure: Not on file  Financial Resource Strain: Not on file  Food Insecurity: Not on file  Transportation Needs: Not on file  Physical Activity: Not on file  Stress: Not on file  Social Connections: Not on file  Intimate Partner Violence: Not on file  Depression (PHQ2-9): Low Risk (03/14/2024)    Depression (PHQ2-9)    PHQ-2 Score: 0  Alcohol Screen: Not on file  Housing: Not on file  Utilities: Not on file  Health Literacy: Not on file    Outpatient Medications Prior to Visit  Medication Sig Dispense Refill   allopurinol  (ZYLOPRIM ) 300 MG tablet TAKE 1/2 TABLET DAILY TO PREVENT GOUT 45 tablet 3   amLODipine  (NORVASC ) 2.5 MG tablet Take 1 tablet (2.5 mg total) by mouth daily. 180 tablet 3   Cholecalciferol (VITAMIN D ) 2000 UNITS tablet Take 2,000 Units by mouth 4 (four) times daily.      clonazePAM (KLONOPIN) 1 MG tablet Take 1 mg by mouth 2 (two) times daily as needed. Takes up to 2 tabs daily prn     methylphenidate (RITALIN LA) 20 MG 24 hr capsule Take by mouth daily.     mirtazapine (REMERON) 45 MG tablet Take 45 mg by mouth at bedtime.     Multiple Vitamin (MULTIVITAMIN) tablet Take 1 tablet by mouth daily.     NEEDLE, DISP, 21 G 21G X 1 MISC Use for testosterone  injections, 3 ml 50 each 1   olmesartan  (BENICAR ) 40 MG tablet TAKE 1/2 TO 1 TABLET AT NIGHT FOR BLOOD PRESSURE 90 tablet 2   Omega-3 Fatty Acids (FISH OIL) 1000 MG CAPS Take 1,000 mg by mouth daily.     SYRINGE-NEEDLE, DISP, 3 ML (B-D 3CC LUER-LOK SYR 21GX1) 21G X 1 3 ML MISC Use to inject testosterone  weekly 100 each 0   gabapentin  (NEURONTIN ) 800 MG tablet TAKE 6 TABLETS DAILY FOR CHRONIC PAIN 540 tablet 3   methylphenidate (METADATE CD) 20 MG CR capsule Take 60 mg by mouth daily.     No facility-administered medications prior to visit.    Allergies[1]  Review of Systems  Constitutional:  Negative for chills, fever, malaise/fatigue and weight loss.  Respiratory:  Negative for shortness of breath.   Cardiovascular:  Negative for chest pain, palpitations and leg swelling.  Gastrointestinal:  Negative for abdominal pain, constipation, diarrhea, nausea and vomiting.  Genitourinary:  Negative for dysuria, frequency and urgency.  Neurological:  Negative for dizziness, focal weakness, loss of consciousness and  headaches.  Endo/Heme/Allergies:  Does not bruise/bleed easily.  Psychiatric/Behavioral:  Negative for depression. The patient is not nervous/anxious.        Objective:    Physical Exam Constitutional:      General: He is not in acute distress.    Appearance: He is not ill-appearing.  HENT:     Mouth/Throat:     Mouth: Mucous membranes are moist.     Pharynx: Oropharynx is clear.  Eyes:     Extraocular Movements: Extraocular movements intact.     Conjunctiva/sclera: Conjunctivae normal.  Cardiovascular:     Rate and Rhythm: Bradycardia present.  Pulmonary:     Effort: Pulmonary effort is normal.  Breath sounds: Normal breath sounds.  Musculoskeletal:     Cervical back: Normal range of motion and neck supple.  Skin:    General: Skin is warm and dry.  Neurological:     General: No focal deficit present.     Mental Status: He is alert and oriented to person, place, and time.     Motor: No weakness.     Coordination: Coordination normal.     Gait: Gait normal.  Psychiatric:        Mood and Affect: Mood normal.        Behavior: Behavior normal.        Thought Content: Thought content normal.      BP 100/68   Pulse (!) 41   Temp 97.8 F (36.6 C) (Temporal)   Ht 5' 9 (1.753 m)   Wt 169 lb (76.7 kg)   SpO2 97%   BMI 24.96 kg/m  Wt Readings from Last 3 Encounters:  03/14/24 169 lb (76.7 kg)  11/22/23 167 lb (75.8 kg)  09/26/23 171 lb 9.6 oz (77.8 kg)       Assessment & Plan:   Problem List Items Addressed This Visit     Hypothyroidism   Relevant Orders   TSH   T4, free   Other Visit Diagnoses       Mild anemia    -  Primary   Relevant Orders   CBC with Differential/Platelet   Ferritin   Folate   Vitamin B12   Ambulatory referral to Gastroenterology   Basic metabolic panel with GFR     Thrombocytopenia       Relevant Orders   CBC with Differential/Platelet   Ferritin   Folate   Vitamin B12     Bradycardia       Relevant Orders   Basic  metabolic panel with GFR   TSH   T4, free     Screen for colon cancer       Relevant Orders   Ambulatory referral to Gastroenterology     Elevated AST (SGOT)       Relevant Orders   Basic metabolic panel with GFR   Hepatic function panel       Assessment and Plan Assessment & Plan Mild anemia Likely related to frequent blood donation. Hemoglobin was 15.2 on the last donation, within normal range. No symptoms of dizziness or fatigue. Iron supplementation started. - Ordered blood tests for iron level, B12, and folate - Continue iron supplementation - also referred for GI consult and colonoscopy (he is due).   Thrombocytopenia Mild thrombocytopenia, likely related to recent blood donation. Not concerning at this time.  Bradycardia Heart rate in the low 40s, even after discontinuation of atenolol . Cardiologist not concerned as long as asymptomatic. Pulse tends to be in the 50s at home, likely due to stimulant use and caffeine. - Continue to monitor heart rate and symptoms  Hypothyroidism Previously on thyroid  medication, discontinued last spring. Thyroid  levels have been normal since discontinuation. Cardiologist suggested thyroid  medication might help with bradycardia. - Ordered thyroid  function tests - Will consider restarting thyroid  medication if levels indicate hypothyroidism  Fatty liver disease Previous imaging showed ambiguous evidence of fatty liver. No symptoms reported. Triglycerides elevated, likely genetic. Cholesterol levels improved with lifestyle changes and omega-3 supplementation. - Continue lifestyle modifications and omega-3 supplementation  General Health Maintenance Due for colonoscopy as last colonoscopy was in November 2014. Cologuard test was negative in 2023. No family history of colon cancer. He is  amenable for colonoscopy.  - Referred to [facility]ology for [facility]     I have changed Kevin Gomez's gabapentin . I am also having him  maintain his Vitamin D , Fish Oil, mirtazapine, clonazePAM, NEEDLE (DISP) 21 G, multivitamin, B-D 3CC LUER-LOK SYR 21GX1, allopurinol , amLODipine , olmesartan , and methylphenidate.  Meds ordered this encounter  Medications   gabapentin  (NEURONTIN ) 800 MG tablet    Sig: Take  6 tablets  Daily  for Chronic Pain    Dispense:  540 tablet    Refill:  3       [1]  Allergies Allergen Reactions   Serzone [Nefazodone]     Leg cramps   Trazamine [Trazodone & Diet Manage Prod] Other (See Comments)    confusion   "

## 2024-03-20 ENCOUNTER — Ambulatory Visit: Payer: Self-pay | Admitting: Family Medicine

## 2024-03-20 ENCOUNTER — Other Ambulatory Visit: Payer: Self-pay | Admitting: Family Medicine

## 2024-03-20 DIAGNOSIS — D649 Anemia, unspecified: Secondary | ICD-10-CM | POA: Insufficient documentation

## 2024-03-20 DIAGNOSIS — D696 Thrombocytopenia, unspecified: Secondary | ICD-10-CM | POA: Insufficient documentation

## 2024-03-20 NOTE — Progress Notes (Signed)
 Please let him know that he is still anemic, slightly worsening along with low platelets. I am referring him to hematology (blood specialist) for further evaluation. They will call him from the [facility]. Also, when was his last colonoscopy and where was it? When did he last see his gastroenterologist?

## 2024-03-21 ENCOUNTER — Other Ambulatory Visit: Payer: Self-pay | Admitting: Family Medicine

## 2024-04-09 NOTE — Progress Notes (Unsigned)
 " [name] [phone]   [phone]  INITIAL CONSULT NOTE  Patient Care Team: Lendia Boby CROME, NP-C as PCP - General (Family Medicine)  Hematological/Oncological History # Microcytic Anemia   CHIEF COMPLAINTS/PURPOSE OF CONSULTATION:   Microcytic Anemia    HISTORY OF PRESENTING ILLNESS:  Kevin Gomez States 69 y.o. male with medical history significant for hypertension, gout, hyperlipidemia, hypertension, hypothyroidism, and testosterone  deficiency who presents for evaluation of a microcytic anemia.   On review of the previous records Kevin Gomez had labs collected on 03/14/2024 which showed white blood cell 6.6, hemoglobin 12.1, MCV 60.7, and platelets of 138.  Labs previously drawn on 11/22/2023 showed white blood cell 7.9, hemoglobin 12.5, MCV 58.5, and platelets of 142.  Due to concern for these findings the patient was referred to hematology for further evaluation and management.  Of note patient consistently has an MCV less than 70.   On exam today Kevin Gomez ***   MEDICAL HISTORY:  Past Medical History:  Diagnosis Date   Abnormal glucose    ADD (attention deficit disorder)    Aortic atherosclerosis (HCC) by T-Spine Xray on 08/26/2020 08/28/2020   BMI 25.0-25.9,adult 01/26/2015   Bradycardia with 31-40 beats per minute 07/03/2023   Essential hypertension    Gout 03/19/2014   Hereditary spherocytosis    Hyperlipidemia    Hyperlipidemia, mixed 01/23/2013   Hypertension    Hypothyroidism 04/14/2019   Medication management 03/19/2014   Right bundle branch block 07/16/2023   Testosterone  Deficiency 05/09/2013   Testosterone  deficiency 07/03/2023   Vitamin D  deficiency     SURGICAL HISTORY: Past Surgical History:  Procedure Laterality Date   FRACTURE SURGERY     ORTHOPEDIC SURGERY Left    left foot pinning   ORTHOPEDIC SURGERY Left    left foot pinning   ORTHOPEDIC SURGERY Right    right elbow bursectomy  1984   ORTHOPEDIC SURGERY  Right 2002 dr supple   right rotator cuff    SOCIAL HISTORY: Social History   Socioeconomic History   Marital status: Married    Spouse name: Not on file   Number of children: Not on file   Years of education: Not on file   Highest education level: Not on file  Occupational History   Not on file  Tobacco Use   Smoking status: Never   Smokeless tobacco: Never  Substance and Sexual Activity   Alcohol use: No    Alcohol/week: 0.0 standard drinks of alcohol   Drug use: Not on file   Sexual activity: Not on file  Other Topics Concern   Not on file  Social History Narrative   Not on file   Social Drivers of Health   Tobacco Use: Low Risk (03/14/2024)   Patient History    Smoking Tobacco Use: Never    Smokeless Tobacco Use: Never    Passive Exposure: Not on file  Financial Resource Strain: Not on file  Food Insecurity: No Food Insecurity (04/08/2024)   Epic    Worried About Programme Researcher, Broadcasting/film/video in the Last Year: Never true    Ran Out of Food in the Last Year: Never true  Transportation Needs: No Transportation Needs (04/08/2024)   Epic    Lack of Transportation (Medical): No    Lack of Transportation (Non-Medical): No  Physical Activity: Not on file  Stress: Not on file  Social Connections: Not on file  Intimate Partner Violence: Not on file  Depression (PHQ2-9): Low Risk (03/14/2024)  Depression (PHQ2-9)    PHQ-2 Score: 0  Alcohol Screen: Not on file  Housing: Low Risk (04/08/2024)   Epic    Unable to Pay for Housing in the Last Year: No    Number of Times Moved in the Last Year: 0    Homeless in the Last Year: No  Utilities: Not At Risk (04/08/2024)   Epic    Threatened with loss of utilities: No  Health Literacy: Not on file    FAMILY HISTORY: Family History  Problem Relation Age of Onset   Cancer Mother        melonoma   Hypertension Father    Aortic stenosis Father     ALLERGIES:  is allergic to serzone [nefazodone] and trazamine [trazodone & diet manage  prod].  MEDICATIONS:  Current Outpatient Medications  Medication Sig Dispense Refill   allopurinol  (ZYLOPRIM ) 300 MG tablet TAKE 1/2 TABLET DAILY TO PREVENT GOUT 45 tablet 3   amLODipine  (NORVASC ) 2.5 MG tablet Take 1 tablet (2.5 mg total) by mouth daily. 180 tablet 3   Cholecalciferol (VITAMIN D ) 2000 UNITS tablet Take 2,000 Units by mouth 4 (four) times daily.      clonazePAM (KLONOPIN) 1 MG tablet Take 1 mg by mouth 2 (two) times daily as needed. Takes up to 2 tabs daily prn     gabapentin  (NEURONTIN ) 800 MG tablet Take  6 tablets  Daily  for Chronic Pain 540 tablet 3   methylphenidate (RITALIN LA) 20 MG 24 hr capsule Take by mouth daily.     mirtazapine (REMERON) 45 MG tablet Take 45 mg by mouth at bedtime.     Multiple Vitamin (MULTIVITAMIN) tablet Take 1 tablet by mouth daily.     NEEDLE, DISP, 21 G 21G X 1 MISC Use for testosterone  injections, 3 ml 50 each 1   olmesartan  (BENICAR ) 40 MG tablet TAKE 1/2 TO 1 TABLET AT NIGHT FOR BLOOD PRESSURE 90 tablet 2   Omega-3 Fatty Acids (FISH OIL) 1000 MG CAPS Take 1,000 mg by mouth daily.     SYRINGE-NEEDLE, DISP, 3 ML (B-D 3CC LUER-LOK SYR 21GX1) 21G X 1 3 ML MISC Use to inject testosterone  weekly 100 each 0   No current facility-administered medications for this visit.    REVIEW OF SYSTEMS:   Constitutional: ( - ) fevers, ( - )  chills , ( - ) night sweats Eyes: ( - ) blurriness of vision, ( - ) double vision, ( - ) watery eyes Ears, nose, mouth, throat, and face: ( - ) mucositis, ( - ) sore throat Respiratory: ( - ) cough, ( - ) dyspnea, ( - ) wheezes Cardiovascular: ( - ) palpitation, ( - ) chest discomfort, ( - ) lower extremity swelling Gastrointestinal:  ( - ) nausea, ( - ) heartburn, ( - ) change in bowel habits Skin: ( - ) abnormal skin rashes Lymphatics: ( - ) new lymphadenopathy, ( - ) easy bruising Neurological: ( - ) numbness, ( - ) tingling, ( - ) new weaknesses Behavioral/Psych: ( - ) mood change, ( - ) new changes  All  other systems were reviewed with the patient and are negative.  PHYSICAL EXAMINATION:  There were no vitals filed for this visit. There were no vitals filed for this visit.  GENERAL: well appearing *** in NAD  SKIN: skin color, texture, turgor are normal, no rashes or significant lesions EYES: conjunctiva are pink and non-injected, sclera clear LUNGS: clear to auscultation and percussion with normal breathing  effort HEART: regular rate & rhythm and no murmurs and no lower extremity edema Musculoskeletal: no cyanosis of digits and no clubbing  PSYCH: alert & oriented x 3, fluent speech NEURO: no focal motor/sensory deficits  LABORATORY DATA:  I have reviewed the data as listed    Latest Ref Rng & Units 03/14/2024    8:35 AM 11/22/2023   10:49 AM 07/03/2023   12:19 PM  CBC  WBC 4.0 - 10.5 K/uL 6.6  7.9  8.3   Hemoglobin 13.0 - 17.0 g/dL 87.8  87.4  86.6   Hematocrit 39.0 - 52.0 % 38.2  40.8  43.9   Platelets 150.0 - 400.0 K/uL 138.0  142.0  154.0        Latest Ref Rng & Units 03/14/2024    8:35 AM 11/26/2023    9:53 AM 11/22/2023   10:49 AM  CMP  Glucose 70 - 99 mg/dL 99  71  96   BUN 6 - 23 mg/dL 23  13  16    Creatinine 0.40 - 1.50 mg/dL 8.81  8.89  8.90   Sodium 135 - 145 mEq/L 137  138  128   Potassium 3.5 - 5.1 mEq/L 4.3  3.7  4.1   Chloride 96 - 112 mEq/L 100  100  95   CO2 19 - 32 mEq/L 30  28  27    Calcium 8.4 - 10.5 mg/dL 9.5  9.9  9.4   Total Protein 6.0 - 8.3 g/dL 6.6   6.7   Total Bilirubin 0.2 - 1.2 mg/dL 0.9   1.1   Alkaline Phos 39 - 117 U/L 99   98   AST 5 - 37 U/L 60   49   ALT 3 - 53 U/L 36   34      ASSESSMENT & PLAN Kevin Gomez 69 y.o. male with medical history significant for hypertension, gout, hyperlipidemia, hypertension, hypothyroidism, and testosterone  deficiency who presents for evaluation of a microcytic anemia.   After review of the labs, review of the records, and discussion with the patient the patients findings are most consistent with  microcytic anemia, with the anemia likely secondary to his blood donation and the microcytosis likely due to an underlying thalassemia.   # Microcytic Anemia --Strong suspicion that the patient's anemia is secondary to his frequent blood donations.  Recommend he temporarily stop all blood donations.  -- Will order full nutritional studies to include iron panel, ferritin, vitamin B12, folate, copper and methylmalonic acid. -- Will order hemolysis labs with LDH and reticulocytes.  If evidence of hemolysis we will also order haptoglobin and DAT --Will order thalassemia testing due to the patient's microcytosis. -- Return to clinic pending the results of the above studies.  # Thrombocytopenia -- ***    No orders of the defined types were placed in this encounter.   All questions were answered. The patient knows to call the clinic with any problems, questions or concerns.  A total of more than 60 minutes were spent on this encounter with face-to-face time and non-face-to-face time, including preparing to see the patient, ordering tests and/or medications, counseling the patient and coordination of care as outlined above.   Norleen IVAR Kidney, MD Department of Hematology/Oncology [facility] Phone: [phone] Pager: [phone] Email: [email or web address]  04/09/2024 8:05 PM  "

## 2024-04-10 ENCOUNTER — Inpatient Hospital Stay: Admitting: Hematology and Oncology

## 2024-04-10 ENCOUNTER — Inpatient Hospital Stay

## 2024-04-10 VITALS — BP 112/85 | HR 57 | Temp 98.2°F | Resp 16 | Wt 170.0 lb

## 2024-04-10 DIAGNOSIS — D509 Iron deficiency anemia, unspecified: Secondary | ICD-10-CM

## 2024-04-10 LAB — CBC WITH DIFFERENTIAL (CANCER CENTER ONLY)
Abs Immature Granulocytes: 0.11 10*3/uL — ABNORMAL HIGH (ref 0.00–0.07)
Basophils Absolute: 0.1 10*3/uL (ref 0.0–0.1)
Basophils Relative: 1 %
Eosinophils Absolute: 0.1 10*3/uL (ref 0.0–0.5)
Eosinophils Relative: 1 %
HCT: 41.9 % (ref 39.0–52.0)
Hemoglobin: 13.1 g/dL (ref 13.0–17.0)
Immature Granulocytes: 1 %
Lymphocytes Relative: 23 %
Lymphs Abs: 2 10*3/uL (ref 0.7–4.0)
MCH: 19.5 pg — ABNORMAL LOW (ref 26.0–34.0)
MCHC: 31.3 g/dL (ref 30.0–36.0)
MCV: 62.3 fL — ABNORMAL LOW (ref 80.0–100.0)
Monocytes Absolute: 0.4 10*3/uL (ref 0.1–1.0)
Monocytes Relative: 5 %
Neutro Abs: 5.8 10*3/uL (ref 1.7–7.7)
Neutrophils Relative %: 69 %
Platelet Count: 148 10*3/uL — ABNORMAL LOW (ref 150–400)
RBC: 6.73 MIL/uL — ABNORMAL HIGH (ref 4.22–5.81)
RDW: 19.1 % — ABNORMAL HIGH (ref 11.5–15.5)
WBC Count: 8.5 10*3/uL (ref 4.0–10.5)
nRBC: 0.2 % (ref 0.0–0.2)

## 2024-04-10 LAB — CMP (CANCER CENTER ONLY)
ALT: 40 U/L (ref 0–44)
AST: 50 U/L — ABNORMAL HIGH (ref 15–41)
Albumin: 4.8 g/dL (ref 3.5–5.0)
Alkaline Phosphatase: 127 U/L — ABNORMAL HIGH (ref 38–126)
Anion gap: 11 (ref 5–15)
BUN: 18 mg/dL (ref 8–23)
CO2: 26 mmol/L (ref 22–32)
Calcium: 9.7 mg/dL (ref 8.9–10.3)
Chloride: 98 mmol/L (ref 98–111)
Creatinine: 1.14 mg/dL (ref 0.61–1.24)
GFR, Estimated: >60 mL/min
Glucose, Bld: 96 mg/dL (ref 70–99)
Potassium: 4.5 mmol/L (ref 3.5–5.1)
Sodium: 135 mmol/L (ref 135–145)
Total Bilirubin: 1.2 mg/dL (ref 0.0–1.2)
Total Protein: 7 g/dL (ref 6.5–8.1)

## 2024-04-10 LAB — IRON AND IRON BINDING CAPACITY (CC-WL,HP ONLY)
Iron: 157 ug/dL (ref 45–182)
Saturation Ratios: 38 % (ref 17.9–39.5)
TIBC: 414 ug/dL (ref 250–450)
UIBC: 257 ug/dL

## 2024-04-10 LAB — VITAMIN B12: Vitamin B-12: 738 pg/mL (ref 180–914)

## 2024-04-10 LAB — RETIC PANEL
Immature Retic Fract: 29.9 % — ABNORMAL HIGH (ref 2.3–15.9)
RBC.: 6.63 MIL/uL — ABNORMAL HIGH (ref 4.22–5.81)
Retic Count, Absolute: 128 10*3/uL (ref 19.0–186.0)
Retic Ct Pct: 1.9 % (ref 0.4–3.1)
Reticulocyte Hemoglobin: 24.4 pg — ABNORMAL LOW

## 2024-04-10 LAB — FERRITIN: Ferritin: 104 ng/mL (ref 24–336)

## 2024-04-10 LAB — FOLATE: Folate: 19.6 ng/mL

## 2024-04-10 LAB — LACTATE DEHYDROGENASE: LDH: 278 U/L — ABNORMAL HIGH (ref 105–235)
